# Patient Record
Sex: Female | Born: 1935 | Race: White | Hispanic: No | Smoking: Never smoker
Health system: Southern US, Community
[De-identification: ages and names within clinical notes are randomized; demographics above are authoritative.]

## PROBLEM LIST (undated history)

## (undated) DIAGNOSIS — F0390 Unspecified dementia without behavioral disturbance: Secondary | ICD-10-CM

## (undated) DIAGNOSIS — I10 Essential (primary) hypertension: Secondary | ICD-10-CM

---

## 2017-08-27 ENCOUNTER — Other Ambulatory Visit: Payer: Self-pay

## 2017-08-27 ENCOUNTER — Emergency Department: Payer: Medicare Other

## 2017-08-27 ENCOUNTER — Emergency Department
Admission: EM | Admit: 2017-08-27 | Discharge: 2017-08-28 | Disposition: A | Discharge status: Home / Self Care | Payer: Medicare Other | Attending: Emergency Medicine | Admitting: Emergency Medicine

## 2017-08-27 DIAGNOSIS — W052XXA Fall from non-moving motorized mobility scooter, initial encounter: Secondary | ICD-10-CM | POA: Insufficient documentation

## 2017-08-27 DIAGNOSIS — Y998 Other external cause status: Secondary | ICD-10-CM | POA: Diagnosis not present

## 2017-08-27 DIAGNOSIS — Y92129 Unspecified place in nursing home as the place of occurrence of the external cause: Secondary | ICD-10-CM | POA: Insufficient documentation

## 2017-08-27 DIAGNOSIS — F0390 Unspecified dementia without behavioral disturbance: Secondary | ICD-10-CM | POA: Diagnosis not present

## 2017-08-27 DIAGNOSIS — Y9389 Activity, other specified: Secondary | ICD-10-CM | POA: Diagnosis not present

## 2017-08-27 DIAGNOSIS — Z79899 Other long term (current) drug therapy: Secondary | ICD-10-CM | POA: Diagnosis not present

## 2017-08-27 DIAGNOSIS — S0990XA Unspecified injury of head, initial encounter: Secondary | ICD-10-CM | POA: Diagnosis present

## 2017-08-27 DIAGNOSIS — S0101XA Laceration without foreign body of scalp, initial encounter: Secondary | ICD-10-CM | POA: Diagnosis not present

## 2017-08-27 NOTE — ED Notes (Signed)
Pt is back from medical imaging. 

## 2017-08-27 NOTE — ED Provider Notes (Addendum)
[facility] Emergency Department Provider Note ____________________________________________   First MD Initiated Contact with Patient 08/27/17 2217     (approximate)  I have reviewed the triage vital signs and the nursing notes.   HISTORY  Chief Complaint Fall  Level 5 caveat: History of present illness limited due to dementia.  HPI Sabrina Burke is a 84 y.o. female with unclear past medical history who presents after a fall.  Per EMS, the patient had a unwitnessed fall from a wheelchair.  She was noted to have a laceration to the left side of her head.  The patient does not remember the fall, and denies any acute complaints.  No past medical history on file.  There are no active problems to display for this patient.    Prior to Admission medications   Medication Sig Start Date End Date Taking? Authorizing Provider  acetaminophen (TYLENOL) 325 MG tablet Take 650 mg by mouth every 6 (six) hours as needed for mild pain or fever.   Yes [provider]  amLODipine (NORVASC) 5 MG tablet Take 5 mg by mouth daily.   Yes [provider]  atenolol (TENORMIN) 25 MG tablet Take 25 mg by mouth daily.   Yes [provider]  cholecalciferol (VITAMIN D) 1000 units tablet Take 1,000 Units by mouth daily.   Yes [provider]  ipratropium-albuterol (DUONEB) 0.5-2.5 (3) MG/3ML SOLN Take 3 mLs by nebulization every 6 (six) hours as needed (shortness of breath).   Yes [provider]  levothyroxine (SYNTHROID, LEVOTHROID) 50 MCG tablet Take 50 mcg by mouth daily before breakfast.   Yes [provider]  LORazepam (ATIVAN) 0.5 MG tablet Take 0.25 mg by mouth 2 (two) times daily. Takes at 1400 and 2100   Yes [provider]  LORazepam (ATIVAN) 0.5 MG tablet Take 0.5 mg by mouth 2 (two) times daily as needed for anxiety (agitation).   Yes [provider]  losartan (COZAAR) 25 MG tablet Take 25 mg by mouth  daily.   Yes [provider]  Multiple Vitamin (DAILY VITE PO) Take 1 tablet by mouth daily.   Yes [provider]  QUEtiapine (SEROQUEL) 50 MG tablet Take 50 mg by mouth 2 (two) times daily. Takes at 1400 and 2100   Yes [provider]    Allergies Patient has no allergy information on record.  No family history on file.  Social History Social History   Tobacco Use  . Smoking status: Not on file  Substance Use Topics  . Alcohol use: Not on file  . Drug use: Not on file    Review of Systems Level 5 caveat: Unable to obtain review of systems due to dementia    ____________________________________________   PHYSICAL EXAM:  VITAL SIGNS: ED Triage Vitals  Enc Vitals Group     BP 08/27/17 2132 (!) 175/80     Pulse Rate 08/27/17 2132 64     Resp 08/27/17 2132 18     Temp 08/27/17 2132 (!) 97.5 F (36.4 C)     Temp src --      SpO2 08/27/17 2131 97 %     Weight 08/27/17 2132 134 lb (60.8 kg)     Height 08/27/17 2132 5\' 6"  (1.676 m)     Head Circumference --      Peak Flow --      Pain Score --      Pain Loc --      Pain Edu? --  Excl. in GC? --     Constitutional: Alert, answering questions appropriately.  Well-appearing for age. Eyes: Conjunctivae are normal.  EOMI.  PERRLA. Head: 2 cm superficial laceration to left parietal area. Nose: No congestion/rhinnorhea. Mouth/Throat: Mucous membranes are moist.   Neck: Normal range of motion.  No midline cervical spinal tenderness. Cardiovascular: Good peripheral circulation.  Chest wall nontender. Respiratory: Normal respiratory effort.  Gastrointestinal: Soft and nontender.  Genitourinary: No flank tenderness. Musculoskeletal: Extremities warm and well perfused.  No deformities.  No midline spinal tenderness. Neurologic:  Normal speech and language.  Motor intact in all extremities.  No gross focal neurologic deficits are appreciated.  Skin:  Skin is warm and dry. No rash  noted. Psychiatric: Calm and cooperative.   ____________________________________________   LABS (all labs ordered are listed, but only abnormal results are displayed)  Labs Reviewed - No data to display ____________________________________________  EKG   ____________________________________________  RADIOLOGY  CT head: Pending CT cervical spine: Pending  ____________________________________________   PROCEDURES  Procedure(s) performed: Yes  .Marland KitchenLaceration Repair Date/Time: 08/27/2017 11:35 PM Performed by: Dionne Bucy, MD Authorized by: Dionne Bucy, MD   Consent:    Consent obtained:  Verbal   Consent given by:  Patient   Risks discussed:  Infection and pain   Alternatives discussed:  No treatment Anesthesia (see MAR for exact dosages):    Anesthesia method:  None Laceration details:    Location:  Scalp   Scalp location:  L parietal   Length (cm):  2   Depth (mm):  1 Repair type:    Repair type:  Simple Exploration:    Wound exploration: entire depth of wound probed and visualized     Contaminated: no   Treatment:    Area cleansed with:  Saline   Amount of cleaning:  Extensive   Irrigation solution:  Sterile saline   Visualized foreign bodies/material removed: no   Skin repair:    Repair method:  Tissue adhesive Approximation:    Approximation:  Close Post-procedure details:    Dressing:  Open (no dressing)   Patient tolerance of procedure:  Tolerated well, no immediate complications    Critical Care performed: No ____________________________________________   INITIAL IMPRESSION / ASSESSMENT AND PLAN / ED COURSE  Pertinent labs & imaging results that were available during my care of the patient were reviewed by me and considered in my medical decision making (see chart for details).  84 year old female with PMH as noted above including history of dementia presents after an apparent mechanical fall from her wheelchair.  She has a  very superficial laceration to the left side of her head which cannot be separated, but no other apparent injuries.  He can be repaired with Dermabond.  She denies any pain or other acute complaints at this time.  On exam, she is comfortable appearing, her vital signs are normal except for hypertension, neuro exam is nonfocal, and there is no other evidence of injury.  At this time, there is no indication for medical work-up.  Will obtain CT head and cervical spine due to her elevated risk of injury related to her age.  I will repair the laceration, and anticipate discharge home if the imaging is negative.  ----------------------------------------- 11:34 PM on 08/27/2017 -----------------------------------------  Laceration repaired successfully with Dermabond.  CT head and C-spine are negative for acute traumatic findings.  The patient is stable for discharge back to her facility.  Return precautions and discharge instructions will be provided in the paperwork.  ____________________________________________   FINAL CLINICAL IMPRESSION(S) / ED DIAGNOSES  Final diagnoses:  Laceration of scalp, initial encounter  Injury of head, initial encounter      NEW MEDICATIONS STARTED DURING THIS VISIT:  New Prescriptions   No medications on file     Note:  This document was prepared using Dragon voice recognition software and may include unintentional dictation errors.       Dionne BucySiadecki, Tulsi Crossett, MD 08/27/17 [phone]

## 2017-08-27 NOTE — ED Notes (Signed)
Esign will not be done. Pt has dementia and is unable to sign.

## 2017-08-27 NOTE — ED Notes (Signed)
Pt is resting in bed. nadn 

## 2017-08-27 NOTE — ED Triage Notes (Addendum)
Pt BIB EMS from [facility] for unwitnessed fall from wheelchair. Small lac noted to left side of head. Bleeding controlled. Hx dementia. ABCs intact. NAD. EMS did not know allergies.

## 2017-08-31 ENCOUNTER — Emergency Department
Admission: EM | Admit: 2017-08-31 | Discharge: 2017-08-31 | Disposition: A | Discharge status: Home / Self Care | Payer: Medicare Other | Attending: Emergency Medicine | Admitting: Emergency Medicine

## 2017-08-31 ENCOUNTER — Emergency Department: Payer: Medicare Other

## 2017-08-31 ENCOUNTER — Other Ambulatory Visit: Payer: Self-pay

## 2017-08-31 DIAGNOSIS — W19XXXA Unspecified fall, initial encounter: Secondary | ICD-10-CM

## 2017-08-31 DIAGNOSIS — S0003XA Contusion of scalp, initial encounter: Secondary | ICD-10-CM | POA: Diagnosis not present

## 2017-08-31 DIAGNOSIS — W0110XA Fall on same level from slipping, tripping and stumbling with subsequent striking against unspecified object, initial encounter: Secondary | ICD-10-CM | POA: Insufficient documentation

## 2017-08-31 DIAGNOSIS — Z79899 Other long term (current) drug therapy: Secondary | ICD-10-CM | POA: Insufficient documentation

## 2017-08-31 DIAGNOSIS — I10 Essential (primary) hypertension: Secondary | ICD-10-CM | POA: Diagnosis not present

## 2017-08-31 DIAGNOSIS — S0990XA Unspecified injury of head, initial encounter: Secondary | ICD-10-CM | POA: Diagnosis present

## 2017-08-31 DIAGNOSIS — Y92129 Unspecified place in nursing home as the place of occurrence of the external cause: Secondary | ICD-10-CM | POA: Insufficient documentation

## 2017-08-31 DIAGNOSIS — Y9389 Activity, other specified: Secondary | ICD-10-CM | POA: Diagnosis not present

## 2017-08-31 DIAGNOSIS — Y998 Other external cause status: Secondary | ICD-10-CM | POA: Insufficient documentation

## 2017-08-31 HISTORY — DX: Essential (primary) hypertension: I10

## 2017-08-31 NOTE — ED Notes (Signed)
EDP aware of high BP. No orders at this time. Will continue to monitor.

## 2017-08-31 NOTE — ED Notes (Signed)
Report given to Kala RN. 

## 2017-08-31 NOTE — Discharge Instructions (Signed)
Please apply neosporin to the head wound 2 times per day.  Have her see primary

## 2017-08-31 NOTE — ED Notes (Signed)
Report given to [facility]. Informing her that pt's scan came back negative and everything checked out.

## 2017-08-31 NOTE — ED Triage Notes (Signed)
Pt comes via [facility] from [facility] with c/o fall. Per EMS pt possibly fell out of chair face forward. Pt has hematoma noted to right side. Pt is alert. VSS. Pt denies any pain.

## 2017-08-31 NOTE — ED Provider Notes (Signed)
Patient was seen and examined by me, right temporoparietal scalp laceration that appears to be superficial.  Brain scan was unremarkable as read by me.  She is hypertensive but seems somewhat agitated likely from being outside of her typical environment, she does have a history of dementia.  She appears medically stable at this time.   Emily FilbertWilliams, Jonathan E, MD 08/31/17 2207

## 2017-08-31 NOTE — ED Provider Notes (Signed)
[facility] Emergency Department Provider Note  ____________________________________________   None    (approximate)  I have reviewed the triage vital signs and the nursing notes.   HISTORY  Chief Complaint Fall   HPI Sanaya Kingsberry is a 84 y.o. female who presents to the emergency department after a fall.  She is a resident at [facility].  The fall was unwitnessed.  She did strike her head and now has a hematoma on the right parietal area with a skin tear.  Patient has dementia at baseline.  She denies pain.  This is her second visit for unwitnessed fall this week.  She was here on August 8 with a laceration to the left side of her head.   Past Medical History:  Diagnosis Date  . Hypertension     There are no active problems to display for this patient.   History reviewed. No pertinent surgical history.  Prior to Admission medications   Medication Sig Start Date End Date Taking? Authorizing Provider  acetaminophen (TYLENOL) 325 MG tablet Take 650 mg by mouth every 6 (six) hours as needed for mild pain or fever.    [provider]  amLODipine (NORVASC) 5 MG tablet Take 5 mg by mouth daily.    [provider]  atenolol (TENORMIN) 25 MG tablet Take 25 mg by mouth daily.    [provider]  cholecalciferol (VITAMIN D) 1000 units tablet Take 1,000 Units by mouth daily.    [provider]  ipratropium-albuterol (DUONEB) 0.5-2.5 (3) MG/3ML SOLN Take 3 mLs by nebulization every 6 (six) hours as needed (shortness of breath).    [provider]  levothyroxine (SYNTHROID, LEVOTHROID) 50 MCG tablet Take 50 mcg by mouth daily before breakfast.    [provider]  LORazepam (ATIVAN) 0.5 MG tablet Take 0.25 mg by mouth 2 (two) times daily. Takes at 1400 and 2100    [provider]  LORazepam (ATIVAN) 0.5 MG tablet Take 0.5 mg by mouth 2 (two) times daily as needed for anxiety (agitation).     [provider]  losartan (COZAAR) 25 MG tablet Take 25 mg by mouth daily.    [provider]  Multiple Vitamin (DAILY VITE PO) Take 1 tablet by mouth daily.    [provider]  QUEtiapine (SEROQUEL) 50 MG tablet Take 50 mg by mouth 2 (two) times daily. Takes at 1400 and 2100    [provider]    Allergies Patient has no allergy information on record.  No family history on file.  Social History Social History   Tobacco Use  . Smoking status: Not on file  Substance Use Topics  . Alcohol use: Not on file  . Drug use: Not on file    Review of Systems  Level 5 caveat: Unable to obtain review of systems due to dementia. ____________________________________________   PHYSICAL EXAM:  VITAL SIGNS: ED Triage Vitals  Enc Vitals Group     BP 08/31/17 2111 (!) 216/86     Pulse Rate 08/31/17 2106 75     Resp 08/31/17 2106 18     Temp 08/31/17 2106 97.9 F (36.6 C)     Temp Source 08/31/17 2106 Oral     SpO2 08/31/17 2106 97 %     Weight 08/31/17 2104 134 lb (60.8 kg)     Height 08/31/17 2104 5\' 6"  (1.676 m)     Head Circumference --      Peak Flow --  Pain Score 08/31/17 2104 0     Pain Loc --      Pain Edu? --      Excl. in GC? --     Constitutional: Alert and oriented only to person.  Chronically ill appearing and in no acute distress. Eyes: Conjunctivae are normal. PERRL Head: Hematoma to the right parietal aspect with skin tear. Nose: No epistaxis Mouth/Throat: Mucous membranes are moist.  No malocclusion Neck: No stridor.  No indication of pain on palpation over the cervical spine. Cardiovascular: Normal rate, regular rhythm. Grossly normal heart sounds.  Good peripheral circulation. Respiratory: Normal respiratory effort.  No retractions. Lungs CTAB. Gastrointestinal: Soft and nontender. No distention. No abdominal bruits. No CVA tenderness. Musculoskeletal: Active range of motion of all extremities without focal joint  tenderness.   Neurologic:  Normal speech and language.  Dementia at baseline. Skin:  Skin is warm, dry and intact.  Hematoma as described above Psychiatric: Mood and affect are normal.   ____________________________________________   LABS (all labs ordered are listed, but only abnormal results are displayed)  Labs Reviewed - No data to display ____________________________________________  EKG  Not indicated. ____________________________________________  RADIOLOGY  ED MD interpretation:  No acute abnormality on CT head or CT cervical spine.  Official radiology report(s): Ct Head Wo Contrast  Result Date: 08/31/2017 CLINICAL DATA:  Unwitnessed fall. Hematoma to the right parietal area. Patient does not recall event. EXAM: CT HEAD WITHOUT CONTRAST CT CERVICAL SPINE WITHOUT CONTRAST TECHNIQUE: Multidetector CT imaging of the head and cervical spine was performed following the standard protocol without intravenous contrast. Multiplanar CT image reconstructions of the cervical spine were also generated. COMPARISON:  08/27/2017 FINDINGS: CT HEAD FINDINGS Brain: Diffuse cerebral atrophy. Ventricular dilatation consistent with central atrophy. Low-attenuation changes in the deep white matter consistent small vessel ischemia. No mass-effect or midline shift. No abnormal extra-axial fluid collections. Gray-white matter junctions are distinct. Basal cisterns are not effaced. No evidence of acute intracranial hemorrhage. Vascular: Intracranial arterial vascular calcifications are present. Skull: Calvarium appears intact. Sinuses/Orbits: Mucosal thickening in the paranasal sinuses. No acute air-fluid levels. Mastoid air cells are clear. Other: Small subcutaneous scalp hematoma over the right posterior frontal region. CT CERVICAL SPINE FINDINGS Alignment: There is mild reversal of the usual cervical lordosis, likely degenerative. Ligamentous injury or muscle spasm can also have this appearance and are not  entirely excluded. C1-2 articulation appears intact. Normal alignment of the facet joints. Skull base and vertebrae: Diffuse bone demineralization. Skull base appears intact. No vertebral compression deformities. No focal bone lesion or bone destruction. Soft tissues and spinal canal: No prevertebral soft tissue swelling. No paraspinal soft tissue mass or infiltration. Disc levels: Degenerative changes throughout the cervical spine with narrowed interspaces and endplate hypertrophic changes. Degenerative changes in the posterior facet joints and in the uncovertebral joints. Bone spurs cause encroachment upon the neural foramina at multiple levels bilaterally. Upper chest: Probable emphysematous changes in the upper lungs. Evaluation is limited by motion artifact. Vascular calcifications. Other: None. IMPRESSION: 1. No acute intracranial abnormalities. Chronic atrophy and small vessel ischemic changes. 2. Nonspecific reversal of the usual cervical lordosis. Degenerative changes in the cervical spine. No acute displaced fractures identified. Electronically Signed   By: Burman NievesWilliam  Stevens M.D.   On: 08/31/2017 22:10   Ct Cervical Spine Wo Contrast  Result Date: 08/31/2017 CLINICAL DATA:  Unwitnessed fall. Hematoma to the right parietal area. Patient does not recall event. EXAM: CT HEAD WITHOUT CONTRAST CT CERVICAL SPINE WITHOUT CONTRAST TECHNIQUE: Multidetector  CT imaging of the head and cervical spine was performed following the standard protocol without intravenous contrast. Multiplanar CT image reconstructions of the cervical spine were also generated. COMPARISON:  08/27/2017 FINDINGS: CT HEAD FINDINGS Brain: Diffuse cerebral atrophy. Ventricular dilatation consistent with central atrophy. Low-attenuation changes in the deep white matter consistent small vessel ischemia. No mass-effect or midline shift. No abnormal extra-axial fluid collections. Gray-white matter junctions are distinct. Basal cisterns are not  effaced. No evidence of acute intracranial hemorrhage. Vascular: Intracranial arterial vascular calcifications are present. Skull: Calvarium appears intact. Sinuses/Orbits: Mucosal thickening in the paranasal sinuses. No acute air-fluid levels. Mastoid air cells are clear. Other: Small subcutaneous scalp hematoma over the right posterior frontal region. CT CERVICAL SPINE FINDINGS Alignment: There is mild reversal of the usual cervical lordosis, likely degenerative. Ligamentous injury or muscle spasm can also have this appearance and are not entirely excluded. C1-2 articulation appears intact. Normal alignment of the facet joints. Skull base and vertebrae: Diffuse bone demineralization. Skull base appears intact. No vertebral compression deformities. No focal bone lesion or bone destruction. Soft tissues and spinal canal: No prevertebral soft tissue swelling. No paraspinal soft tissue mass or infiltration. Disc levels: Degenerative changes throughout the cervical spine with narrowed interspaces and endplate hypertrophic changes. Degenerative changes in the posterior facet joints and in the uncovertebral joints. Bone spurs cause encroachment upon the neural foramina at multiple levels bilaterally. Upper chest: Probable emphysematous changes in the upper lungs. Evaluation is limited by motion artifact. Vascular calcifications. Other: None. IMPRESSION: 1. No acute intracranial abnormalities. Chronic atrophy and small vessel ischemic changes. 2. Nonspecific reversal of the usual cervical lordosis. Degenerative changes in the cervical spine. No acute displaced fractures identified. Electronically Signed   By: Burman NievesWilliam  Stevens M.D.   On: 08/31/2017 22:10    ____________________________________________   PROCEDURES  Procedure(s) performed: None  Procedures  Critical Care performed: No  ____________________________________________   INITIAL IMPRESSION / ASSESSMENT AND PLAN / ED COURSE  As part of my  medical decision making, I reviewed the following data within the electronic MEDICAL RECORD NUMBER Notes from prior ED visits   84 year old female presenting to the emergency department after a fall.  CT and exam are reassuring.  She will be discharged back to the care facility.  Wound care instructions have been provided.      ____________________________________________   FINAL CLINICAL IMPRESSION(S) / ED DIAGNOSES  Final diagnoses:  Scalp hematoma, initial encounter  Fall, initial encounter     ED Discharge Orders    None       Note:  This document was prepared using Dragon voice recognition software and may include unintentional dictation errors.    Chinita Pesterriplett, Gaynel Schaafsma B, FNP 08/31/17 2338    Emily FilbertWilliams, Jonathan E, MD 09/02/17 [phone]

## 2017-08-31 NOTE — ED Notes (Signed)
Pt is a memory care pt with Dementia and cannot sign for herself.

## 2018-03-30 ENCOUNTER — Emergency Department: Payer: Medicare Other

## 2018-03-30 ENCOUNTER — Emergency Department
Admission: EM | Admit: 2018-03-30 | Discharge: 2018-03-30 | Disposition: A | Discharge status: Skilled Nursing Facility | Payer: Medicare Other | Attending: Emergency Medicine | Admitting: Emergency Medicine

## 2018-03-30 DIAGNOSIS — Y999 Unspecified external cause status: Secondary | ICD-10-CM | POA: Diagnosis not present

## 2018-03-30 DIAGNOSIS — W050XXA Fall from non-moving wheelchair, initial encounter: Secondary | ICD-10-CM | POA: Insufficient documentation

## 2018-03-30 DIAGNOSIS — Y92129 Unspecified place in nursing home as the place of occurrence of the external cause: Secondary | ICD-10-CM | POA: Insufficient documentation

## 2018-03-30 DIAGNOSIS — S0083XA Contusion of other part of head, initial encounter: Secondary | ICD-10-CM | POA: Insufficient documentation

## 2018-03-30 DIAGNOSIS — S0990XA Unspecified injury of head, initial encounter: Secondary | ICD-10-CM | POA: Diagnosis present

## 2018-03-30 DIAGNOSIS — Y9389 Activity, other specified: Secondary | ICD-10-CM | POA: Diagnosis not present

## 2018-03-30 DIAGNOSIS — I10 Essential (primary) hypertension: Secondary | ICD-10-CM | POA: Insufficient documentation

## 2018-03-30 DIAGNOSIS — Z79899 Other long term (current) drug therapy: Secondary | ICD-10-CM | POA: Insufficient documentation

## 2018-03-30 DIAGNOSIS — W19XXXA Unspecified fall, initial encounter: Secondary | ICD-10-CM

## 2018-03-30 MED ORDER — ACETAMINOPHEN 500 MG PO TABS: 1000.0000 mg | ORAL_TABLET | Freq: Once | ORAL | Status: DC

## 2018-03-30 NOTE — ED Notes (Signed)
This RN called [facility] to provide report/update on patient. Care handoff report given to [facility].

## 2018-03-30 NOTE — ED Provider Notes (Signed)
[facility] Emergency Department Provider Note  ____________________________________________  Time seen: Approximately 9:20 AM  I have reviewed the triage vital signs and the nursing notes.   HISTORY  Chief Complaint Fall  Level 5 caveat:  Portions of the history and physical were unable to be obtained due to dementia   HPI Sabrina Burke is a 85 y.o. female with a history of hypertension and dementia who presents for evaluation of fall and head trauma.  Fall was witnessed.  Patient was wearing slippery socks and trying to get up from her wheelchair when she slipped and fell.  She hit her head on the floor.  She is not on blood thinners, no LOC.  Patient denies headache, neck pain, back pain, dizziness, extremity pain.  Past Medical History:  Diagnosis Date  . Hypertension     Prior to Admission medications   Medication Sig Start Date End Date Taking? Authorizing Provider  acetaminophen (TYLENOL) 325 MG tablet Take 650 mg by mouth every 6 (six) hours as needed for mild pain or fever.    [provider]  amLODipine (NORVASC) 5 MG tablet Take 5 mg by mouth daily.    [provider]  atenolol (TENORMIN) 25 MG tablet Take 25 mg by mouth daily.    [provider]  cholecalciferol (VITAMIN D) 1000 units tablet Take 1,000 Units by mouth daily.    [provider]  ipratropium-albuterol (DUONEB) 0.5-2.5 (3) MG/3ML SOLN Take 3 mLs by nebulization every 6 (six) hours as needed (shortness of breath).    [provider]  levothyroxine (SYNTHROID, LEVOTHROID) 50 MCG tablet Take 50 mcg by mouth daily before breakfast.    [provider]  LORazepam (ATIVAN) 0.5 MG tablet Take 0.25 mg by mouth 2 (two) times daily. Takes at 1400 and 2100    [provider]  LORazepam (ATIVAN) 0.5 MG tablet Take 0.5 mg by mouth 2 (two) times daily as needed for anxiety (agitation).    [provider]  losartan (COZAAR)  25 MG tablet Take 25 mg by mouth daily.    [provider]  Multiple Vitamin (DAILY VITE PO) Take 1 tablet by mouth daily.    [provider]  QUEtiapine (SEROQUEL) 50 MG tablet Take 50 mg by mouth 2 (two) times daily. Takes at 1400 and 2100    [provider]    Allergies Patient has no allergy information on record.  No family history on file.  Social History Smoking - former Alcohol - no Drugs - no  Review of Systems Constitutional: Negative for fever. Eyes: Negative for visual changes. ENT: Negative for facial injury or neck injury Cardiovascular: Negative for chest injury. Respiratory: Negative for shortness of breath. Negative for chest wall injury. Gastrointestinal: Negative for abdominal pain or injury. Genitourinary: Negative for dysuria. Musculoskeletal: Negative for back injury, negative for arm or leg pain. Skin: Negative for laceration/abrasions. Neurological: + head injury.   ____________________________________________   PHYSICAL EXAM:  VITAL SIGNS: ED Triage Vitals  Enc Vitals Group     BP 03/30/18 0913 (!) 178/60     Pulse --      Resp 03/30/18 0913 18     Temp 03/30/18 0913 97.9 F (36.6 C)     Temp Source 03/30/18 0913 Oral     SpO2 03/30/18 0913 98 %     Weight --      Height --      Head Circumference --      Peak  Flow --      Pain Score 03/30/18 0915 0     Pain Loc --      Pain Edu? --      Excl. in GC? --    Full spinal precautions maintained throughout the trauma exam. Constitutional: Alert and oriented x1. No acute distress. Does not appear intoxicated. HEENT Head: Normocephalic, forehead hematoma on the L. Face: No facial bony tenderness. Stable midface Ears: No hemotympanum bilaterally. No Battle sign Eyes: No eye injury. PERRL. No raccoon eyes Nose: Nontender. No epistaxis. No rhinorrhea Mouth/Throat: Mucous membranes are moist. No oropharyngeal blood. No dental injury. Airway patent without stridor.  Normal voice. Neck: no C-collar in place. No midline c-spine tenderness.  Cardiovascular: Normal rate, regular rhythm. Normal and symmetric distal pulses are present in all extremities. Pulmonary/Chest: Chest wall is stable and nontender to palpation/compression. Normal respiratory effort. Breath sounds are normal. No crepitus.  Abdominal: Soft, nontender, non distended. Musculoskeletal: Nontender with normal full range of motion in all extremities. No deformities. No thoracic or lumbar midline spinal tenderness. Pelvis is stable. Skin: Skin is warm, dry and intact. No abrasions or contutions. Psychiatric: Speech and behavior are appropriate. Neurological: Normal speech and language. Moves all extremities to command. No gross focal neurologic deficits are appreciated.  Glascow Coma Score: 4 - Opens eyes on own 6 - Follows simple motor commands 5 - Alert and oriented GCS: 15   ____________________________________________   LABS (all labs ordered are listed, but only abnormal results are displayed)  Labs Reviewed - No data to display ____________________________________________  EKG  none  ____________________________________________  RADIOLOGY  I have personally reviewed the images performed during this visit and I agree with the Radiologist's read.   Interpretation by Radiologist:  Ct Head Wo Contrast  Result Date: 03/30/2018 CLINICAL DATA:  85 year old female with a history of unwitnessed fall from wheelchair EXAM: CT HEAD WITHOUT CONTRAST CT CERVICAL SPINE WITHOUT CONTRAST TECHNIQUE: Multidetector CT imaging of the head and cervical spine was performed following the standard protocol without intravenous contrast. Multiplanar CT image reconstructions of the cervical spine were also generated. COMPARISON:  08/31/2017, 08/27/2017 FINDINGS: CT HEAD FINDINGS Brain: No acute intracranial hemorrhage. No midline shift or mass effect. Gray-white differentiation is maintained. Similar  degree of mild volume loss. Confluent hypodensity compatible with chronic microvascular ischemia. Vascular: Calcifications of the anterior circulation. Skull: No displaced fracture. Hematoma in the left frontal soft tissues. Sinuses/Orbits: No significant paranasal sinus disease. Unremarkable visualized orbits. Other: None CT CERVICAL SPINE FINDINGS Alignment: Reversal the normal cervical lordosis, similar to prior. No subluxation. Similar degree trace anterolisthesis of C3 on C4 measuring 2 mm-3 mm. Skull base and vertebrae: No skull base fracture. No vertebral body fracture identified. Vertebral body heights relatively maintained. Soft tissues and spinal canal: Relatively unremarkable soft tissues of the cervical region. Disc levels: Disc space narrowing throughout the cervical spine. Disc changes most pronounced at C5-C6 with anterior osteophyte production and bilateral uncovertebral joint disease. Mild-to-moderate facet disease bilaterally. No significant bony canal narrowing. Upper chest: No acute finding of the upper chest Other: None IMPRESSION: Head CT: No acute intracranial abnormality. Hematoma of the left frontal soft tissues with no underlying skull fracture. Evidence of chronic white matter disease. Cervical CT: No acute fracture or malalignment of the cervical spine. Multilevel degenerative disc disease and facet disease. Electronically Signed   By: Gilmer Mor D.O.   On: 03/30/2018 10:33   Ct Cervical Spine Wo Contrast  Result Date: 03/30/2018 CLINICAL DATA:  85 year old  female with a history of unwitnessed fall from wheelchair EXAM: CT HEAD WITHOUT CONTRAST CT CERVICAL SPINE WITHOUT CONTRAST TECHNIQUE: Multidetector CT imaging of the head and cervical spine was performed following the standard protocol without intravenous contrast. Multiplanar CT image reconstructions of the cervical spine were also generated. COMPARISON:  08/31/2017, 08/27/2017 FINDINGS: CT HEAD FINDINGS Brain: No acute  intracranial hemorrhage. No midline shift or mass effect. Gray-white differentiation is maintained. Similar degree of mild volume loss. Confluent hypodensity compatible with chronic microvascular ischemia. Vascular: Calcifications of the anterior circulation. Skull: No displaced fracture. Hematoma in the left frontal soft tissues. Sinuses/Orbits: No significant paranasal sinus disease. Unremarkable visualized orbits. Other: None CT CERVICAL SPINE FINDINGS Alignment: Reversal the normal cervical lordosis, similar to prior. No subluxation. Similar degree trace anterolisthesis of C3 on C4 measuring 2 mm-3 mm. Skull base and vertebrae: No skull base fracture. No vertebral body fracture identified. Vertebral body heights relatively maintained. Soft tissues and spinal canal: Relatively unremarkable soft tissues of the cervical region. Disc levels: Disc space narrowing throughout the cervical spine. Disc changes most pronounced at C5-C6 with anterior osteophyte production and bilateral uncovertebral joint disease. Mild-to-moderate facet disease bilaterally. No significant bony canal narrowing. Upper chest: No acute finding of the upper chest Other: None IMPRESSION: Head CT: No acute intracranial abnormality. Hematoma of the left frontal soft tissues with no underlying skull fracture. Evidence of chronic white matter disease. Cervical CT: No acute fracture or malalignment of the cervical spine. Multilevel degenerative disc disease and facet disease. Electronically Signed   By: Gilmer Mor D.O.   On: 03/30/2018 10:33      ____________________________________________   PROCEDURES  Procedure(s) performed: None Procedures Critical Care performed:  None ____________________________________________   INITIAL IMPRESSION / ASSESSMENT AND PLAN / ED COURSE  85 y.o. female with a history of hypertension and dementia who presents for evaluation of fall and head trauma.  Patient is currently at baseline per EMS.  She  is alert and oriented to self, able to answer questions and follow commands, normal speech.  Patient is pleasant and cooperative.  She denies any pain.  She has an obvious left-sided forehead hematoma with no other traumatic findings seen on exam.  Will give Tylenol and send patient for CT head and cervical spine.    _________________________ 10:59 AM on 03/30/2018 -----------------------------------------  CT with no evidence of intracranial or cervical injury.  Patient remains at baseline.  Will discharge patient back to the nursing home.   As part of my medical decision making, I reviewed the following data within the electronic MEDICAL RECORD NUMBER Nursing notes reviewed and incorporated, Old chart reviewed, Radiograph reviewed , Notes from prior ED visits and [place] Controlled Substance Database    Pertinent labs & imaging results that were available during my care of the patient were reviewed by me and considered in my medical decision making (see chart for details).    ____________________________________________   FINAL CLINICAL IMPRESSION(S) / ED DIAGNOSES  Final diagnoses:  Fall, initial encounter  Traumatic hematoma of forehead, initial encounter      NEW MEDICATIONS STARTED DURING THIS VISIT:  ED Discharge Orders    None       Note:  This document was prepared using Dragon voice recognition software and may include unintentional dictation errors.    Don Perking, [place], MD 03/30/18 1059

## 2018-03-30 NOTE — ED Notes (Addendum)
This RN provide status update to [facility] visitor

## 2018-03-30 NOTE — ED Notes (Signed)
Pt altered and unable to contact family to sing DC instructions/documents

## 2018-03-30 NOTE — ED Notes (Addendum)
This RN attempted to call daughter, Josiephine Mirkin.Daugther , unable to speak to daughter/leave a voicemail

## 2018-03-30 NOTE — Discharge Instructions (Signed)

## 2018-03-30 NOTE — ED Notes (Signed)
Patient transported to CT 

## 2018-03-30 NOTE — ED Notes (Signed)
Called [facility] for transport to [facility] [phone]

## 2018-03-30 NOTE — ED Triage Notes (Addendum)
Pt arrived to ED via [facility] from [facility] facility Per EMS pt fell off from wheelchair. No LOC, pt denies HA, pain.  Hematoma noted on forehead/left side. VSS. NAD noted at this time.

## 2018-06-26 ENCOUNTER — Emergency Department: Payer: Medicare Other

## 2018-06-26 ENCOUNTER — Other Ambulatory Visit: Payer: Self-pay

## 2018-06-26 ENCOUNTER — Encounter: Payer: Self-pay | Admitting: Emergency Medicine

## 2018-06-26 ENCOUNTER — Inpatient Hospital Stay
Admission: EM | Admit: 2018-06-26 | Discharge: 2018-06-28 | DRG: 872 | Disposition: A | Discharge status: Skilled Nursing Facility | Payer: Medicare Other | Attending: Internal Medicine | Admitting: Internal Medicine

## 2018-06-26 DIAGNOSIS — F0150 Vascular dementia without behavioral disturbance: Secondary | ICD-10-CM | POA: Diagnosis present

## 2018-06-26 DIAGNOSIS — Z79890 Hormone replacement therapy (postmenopausal): Secondary | ICD-10-CM

## 2018-06-26 DIAGNOSIS — N3000 Acute cystitis without hematuria: Secondary | ICD-10-CM | POA: Diagnosis present

## 2018-06-26 DIAGNOSIS — A419 Sepsis, unspecified organism: Secondary | ICD-10-CM | POA: Diagnosis not present

## 2018-06-26 DIAGNOSIS — I739 Peripheral vascular disease, unspecified: Secondary | ICD-10-CM | POA: Diagnosis present

## 2018-06-26 DIAGNOSIS — B961 Klebsiella pneumoniae [K. pneumoniae] as the cause of diseases classified elsewhere: Secondary | ICD-10-CM | POA: Diagnosis present

## 2018-06-26 DIAGNOSIS — Z79899 Other long term (current) drug therapy: Secondary | ICD-10-CM

## 2018-06-26 DIAGNOSIS — N179 Acute kidney failure, unspecified: Secondary | ICD-10-CM | POA: Diagnosis present

## 2018-06-26 DIAGNOSIS — E039 Hypothyroidism, unspecified: Secondary | ICD-10-CM | POA: Diagnosis present

## 2018-06-26 DIAGNOSIS — Z20828 Contact with and (suspected) exposure to other viral communicable diseases: Secondary | ICD-10-CM | POA: Diagnosis present

## 2018-06-26 DIAGNOSIS — E559 Vitamin D deficiency, unspecified: Secondary | ICD-10-CM | POA: Diagnosis present

## 2018-06-26 DIAGNOSIS — F419 Anxiety disorder, unspecified: Secondary | ICD-10-CM | POA: Diagnosis present

## 2018-06-26 DIAGNOSIS — Z993 Dependence on wheelchair: Secondary | ICD-10-CM

## 2018-06-26 DIAGNOSIS — Z66 Do not resuscitate: Secondary | ICD-10-CM | POA: Diagnosis present

## 2018-06-26 DIAGNOSIS — I10 Essential (primary) hypertension: Secondary | ICD-10-CM | POA: Diagnosis present

## 2018-06-26 DIAGNOSIS — I480 Paroxysmal atrial fibrillation: Secondary | ICD-10-CM | POA: Diagnosis present

## 2018-06-26 LAB — COMPREHENSIVE METABOLIC PANEL
ALT: 15 U/L (ref 0–44)
AST: 18 U/L (ref 15–41)
Albumin: 2.5 g/dL — ABNORMAL LOW (ref 3.5–5.0)
Alkaline Phosphatase: 55 U/L (ref 38–126)
Anion gap: 7 (ref 5–15)
BUN: 40 mg/dL — ABNORMAL HIGH (ref 8–23)
CO2: 24 mmol/L (ref 22–32)
Calcium: 8.3 mg/dL — ABNORMAL LOW (ref 8.9–10.3)
Chloride: 106 mmol/L (ref 98–111)
Creatinine, Ser: 1.42 mg/dL — ABNORMAL HIGH (ref 0.44–1.00)
GFR calc Af Amer: 38 mL/min — ABNORMAL LOW (ref >60)
GFR calc non Af Amer: 33 mL/min — ABNORMAL LOW (ref >60)
Glucose, Bld: 140 mg/dL — ABNORMAL HIGH (ref 70–99)
Potassium: 4.1 mmol/L (ref 3.5–5.1)
Sodium: 137 mmol/L (ref 135–145)
Total Bilirubin: 0.5 mg/dL (ref 0.3–1.2)
Total Protein: 6.4 g/dL — ABNORMAL LOW (ref 6.5–8.1)

## 2018-06-26 LAB — CBC WITH DIFFERENTIAL/PLATELET
Abs Immature Granulocytes: 0.05 10*3/uL (ref 0.00–0.07)
Basophils Absolute: 0 10*3/uL (ref 0.0–0.1)
Basophils Relative: 0 %
Eosinophils Absolute: 0.1 10*3/uL (ref 0.0–0.5)
Eosinophils Relative: 1 %
HCT: 28.5 % — ABNORMAL LOW (ref 36.0–46.0)
Hemoglobin: 9.4 g/dL — ABNORMAL LOW (ref 12.0–15.0)
Immature Granulocytes: 0 %
Lymphocytes Relative: 15 %
Lymphs Abs: 1.7 10*3/uL (ref 0.7–4.0)
MCH: 32.9 pg (ref 26.0–34.0)
MCHC: 33 g/dL (ref 30.0–36.0)
MCV: 99.7 fL (ref 80.0–100.0)
Monocytes Absolute: 1 10*3/uL (ref 0.1–1.0)
Monocytes Relative: 9 %
Neutro Abs: 8.7 10*3/uL — ABNORMAL HIGH (ref 1.7–7.7)
Neutrophils Relative %: 75 %
Platelets: 240 10*3/uL (ref 150–400)
RBC: 2.86 MIL/uL — ABNORMAL LOW (ref 3.87–5.11)
RDW: 11.6 % (ref 11.5–15.5)
WBC: 11.5 10*3/uL — ABNORMAL HIGH (ref 4.0–10.5)
nRBC: 0 % (ref 0.0–0.2)

## 2018-06-26 LAB — BASIC METABOLIC PANEL
Anion gap: 7 (ref 5–15)
BUN: 37 mg/dL — ABNORMAL HIGH (ref 8–23)
CO2: 27 mmol/L (ref 22–32)
Calcium: 8.6 mg/dL — ABNORMAL LOW (ref 8.9–10.3)
Chloride: 107 mmol/L (ref 98–111)
Creatinine, Ser: 1.49 mg/dL — ABNORMAL HIGH (ref 0.44–1.00)
GFR calc Af Amer: 36 mL/min — ABNORMAL LOW (ref >60)
GFR calc non Af Amer: 31 mL/min — ABNORMAL LOW (ref >60)
Glucose, Bld: 129 mg/dL — ABNORMAL HIGH (ref 70–99)
Potassium: 4.9 mmol/L (ref 3.5–5.1)
Sodium: 141 mmol/L (ref 135–145)

## 2018-06-26 LAB — CBC
HCT: 31.2 % — ABNORMAL LOW (ref 36.0–46.0)
Hemoglobin: 10 g/dL — ABNORMAL LOW (ref 12.0–15.0)
MCH: 32.2 pg (ref 26.0–34.0)
MCHC: 32.1 g/dL (ref 30.0–36.0)
MCV: 100.3 fL — ABNORMAL HIGH (ref 80.0–100.0)
Platelets: 241 10*3/uL (ref 150–400)
RBC: 3.11 MIL/uL — ABNORMAL LOW (ref 3.87–5.11)
RDW: 11.6 % (ref 11.5–15.5)
WBC: 10.5 10*3/uL (ref 4.0–10.5)
nRBC: 0 % (ref 0.0–0.2)

## 2018-06-26 LAB — URINALYSIS, ROUTINE W REFLEX MICROSCOPIC
Bilirubin Urine: NEGATIVE
Glucose, UA: NEGATIVE mg/dL
Ketones, ur: NEGATIVE mg/dL
Nitrite: POSITIVE — AB
Protein, ur: 100 mg/dL — AB
Specific Gravity, Urine: 1.011 (ref 1.005–1.030)
WBC, UA: >50 WBC/hpf — ABNORMAL HIGH (ref 0–5)
pH: 5 (ref 5.0–8.0)

## 2018-06-26 LAB — PROTIME-INR
INR: 1 (ref 0.8–1.2)
Prothrombin Time: 13.3 seconds (ref 11.4–15.2)

## 2018-06-26 LAB — BLOOD GAS, VENOUS
Acid-Base Excess: 3.2 mmol/L — ABNORMAL HIGH (ref 0.0–2.0)
Bicarbonate: 27.9 mmol/L (ref 20.0–28.0)
O2 Saturation: 90.5 %
Patient temperature: 37
pCO2, Ven: 42 mmHg — ABNORMAL LOW (ref 44.0–60.0)
pH, Ven: 7.43 (ref 7.250–7.430)
pO2, Ven: 58 mmHg — ABNORMAL HIGH (ref 32.0–45.0)

## 2018-06-26 LAB — TSH: TSH: 4.181 u[IU]/mL (ref 0.350–4.500)

## 2018-06-26 LAB — MRSA PCR SCREENING: MRSA by PCR: NEGATIVE

## 2018-06-26 LAB — PROCALCITONIN: Procalcitonin: 0.24 ng/mL

## 2018-06-26 LAB — CORTISOL-AM, BLOOD: Cortisol - AM: 23.2 ug/dL — ABNORMAL HIGH (ref 6.7–22.6)

## 2018-06-26 LAB — SARS CORONAVIRUS 2 BY RT PCR (HOSPITAL ORDER, PERFORMED IN ~~LOC~~ HOSPITAL LAB): SARS Coronavirus 2: NEGATIVE

## 2018-06-26 LAB — LACTIC ACID, PLASMA
Lactic Acid, Venous: 0.7 mmol/L (ref 0.5–1.9)
Lactic Acid, Venous: 1 mmol/L (ref 0.5–1.9)

## 2018-06-26 MED ORDER — ONDANSETRON HCL 4 MG/2ML IJ SOLN: 4.0000 mg | Freq: Four times a day (QID) | INTRAMUSCULAR | Status: DC | PRN

## 2018-06-26 MED ORDER — VANCOMYCIN HCL IN DEXTROSE 1-5 GM/200ML-% IV SOLN
1000.0000 mg | Freq: Once | INTRAVENOUS | Status: AC
  Administered 2018-06-26: 1000 mg via INTRAVENOUS
  Filled 2018-06-26: qty 200

## 2018-06-26 MED ORDER — VITAMIN D3 25 MCG (1000 UNIT) PO TABS
2000.0000 [IU] | ORAL_TABLET | Freq: Every day | ORAL | Status: DC
  Administered 2018-06-26 – 2018-06-28 (×3): 2000 [IU] via ORAL
  Filled 2018-06-26 (×6): qty 2

## 2018-06-26 MED ORDER — SODIUM CHLORIDE 0.9% FLUSH
3.0000 mL | Freq: Two times a day (BID) | INTRAVENOUS | Status: DC
  Administered 2018-06-26 – 2018-06-28 (×5): 3 mL via INTRAVENOUS

## 2018-06-26 MED ORDER — ALUM & MAG HYDROXIDE-SIMETH 200-200-20 MG/5ML PO SUSP: 30.0000 mL | ORAL | Status: DC | PRN

## 2018-06-26 MED ORDER — METRONIDAZOLE IN NACL 5-0.79 MG/ML-% IV SOLN
500.0000 mg | Freq: Once | INTRAVENOUS | Status: AC
  Administered 2018-06-26: 500 mg via INTRAVENOUS
  Filled 2018-06-26: qty 100

## 2018-06-26 MED ORDER — ENOXAPARIN SODIUM 30 MG/0.3ML ~~LOC~~ SOLN
30.0000 mg | SUBCUTANEOUS | Status: DC
  Administered 2018-06-26 – 2018-06-27 (×2): 30 mg via SUBCUTANEOUS
  Filled 2018-06-26 (×2): qty 0.3

## 2018-06-26 MED ORDER — SODIUM CHLORIDE 0.9 % IV SOLN
2.0000 g | Freq: Once | INTRAVENOUS | Status: AC
  Administered 2018-06-26: 2 g via INTRAVENOUS
  Filled 2018-06-26: qty 2

## 2018-06-26 MED ORDER — POLYETHYLENE GLYCOL 3350 17 G PO PACK: 17.0000 g | PACK | Freq: Every day | ORAL | Status: DC | PRN

## 2018-06-26 MED ORDER — ACETAMINOPHEN 325 MG PO TABS: 650.0000 mg | ORAL_TABLET | Freq: Four times a day (QID) | ORAL | Status: DC | PRN

## 2018-06-26 MED ORDER — SODIUM CHLORIDE 0.9 % IV SOLN
INTRAVENOUS | Status: DC
  Administered 2018-06-26: 06:00:00 via INTRAVENOUS

## 2018-06-26 MED ORDER — AMLODIPINE BESYLATE 5 MG PO TABS
5.0000 mg | ORAL_TABLET | Freq: Every day | ORAL | Status: DC
  Administered 2018-06-26 – 2018-06-27 (×2): 5 mg via ORAL
  Filled 2018-06-26 (×2): qty 1

## 2018-06-26 MED ORDER — LORAZEPAM 0.5 MG PO TABS
0.2500 mg | ORAL_TABLET | Freq: Two times a day (BID) | ORAL | Status: DC
  Administered 2018-06-26 – 2018-06-28 (×5): 0.25 mg via ORAL
  Filled 2018-06-26 (×5): qty 1

## 2018-06-26 MED ORDER — ADULT MULTIVITAMIN W/MINERALS CH
1.0000 | ORAL_TABLET | Freq: Every day | ORAL | Status: DC
  Administered 2018-06-26 – 2018-06-28 (×3): 1 via ORAL
  Filled 2018-06-26 (×3): qty 1

## 2018-06-26 MED ORDER — LEVOTHYROXINE SODIUM 88 MCG PO TABS
88.0000 ug | ORAL_TABLET | Freq: Every day | ORAL | Status: DC
  Administered 2018-06-27 – 2018-06-28 (×2): 88 ug via ORAL
  Filled 2018-06-26 (×3): qty 1

## 2018-06-26 MED ORDER — FOLIC ACID 5 MG/ML IJ SOLN
1.0000 mg | Freq: Every day | INTRAMUSCULAR | Status: DC
  Administered 2018-06-26: 11:00:00 1 mg via INTRAVENOUS
  Filled 2018-06-26 (×2): qty 0.2

## 2018-06-26 MED ORDER — ATENOLOL 25 MG PO TABS
25.0000 mg | ORAL_TABLET | Freq: Every day | ORAL | Status: DC
  Administered 2018-06-26 – 2018-06-27 (×2): 25 mg via ORAL
  Filled 2018-06-26 (×3): qty 1

## 2018-06-26 MED ORDER — IPRATROPIUM-ALBUTEROL 0.5-2.5 (3) MG/3ML IN SOLN
3.0000 mL | Freq: Four times a day (QID) | RESPIRATORY_TRACT | Status: DC
  Administered 2018-06-26: 15:00:00 3 mL via RESPIRATORY_TRACT
  Filled 2018-06-26: qty 3

## 2018-06-26 MED ORDER — SODIUM CHLORIDE 0.9 % IV SOLN
1.0000 g | Freq: Two times a day (BID) | INTRAVENOUS | Status: DC
  Administered 2018-06-26 – 2018-06-27 (×2): 1 g via INTRAVENOUS
  Filled 2018-06-26 (×4): qty 1

## 2018-06-26 MED ORDER — THIAMINE HCL 100 MG/ML IJ SOLN
100.0000 mg | Freq: Every day | INTRAMUSCULAR | Status: DC
  Administered 2018-06-26: 11:00:00 100 mg via INTRAVENOUS
  Filled 2018-06-26 (×2): qty 2

## 2018-06-26 MED ORDER — IPRATROPIUM-ALBUTEROL 0.5-2.5 (3) MG/3ML IN SOLN: 3.0000 mL | Freq: Four times a day (QID) | RESPIRATORY_TRACT | Status: DC | PRN

## 2018-06-26 MED ORDER — ACETAMINOPHEN 650 MG RE SUPP: 650.0000 mg | Freq: Four times a day (QID) | RECTAL | Status: DC | PRN

## 2018-06-26 MED ORDER — DOCUSATE SODIUM 100 MG PO CAPS
100.0000 mg | ORAL_CAPSULE | Freq: Every day | ORAL | Status: DC
  Administered 2018-06-26 – 2018-06-27 (×2): 100 mg via ORAL
  Filled 2018-06-26 (×2): qty 1

## 2018-06-26 MED ORDER — ONDANSETRON HCL 4 MG PO TABS: 4.0000 mg | ORAL_TABLET | Freq: Four times a day (QID) | ORAL | Status: DC | PRN

## 2018-06-26 MED ORDER — SODIUM CHLORIDE 0.9 % IV SOLN: 2.0000 g | Freq: Once | INTRAVENOUS | Status: DC

## 2018-06-26 NOTE — Progress Notes (Signed)
Patient ID: Sabrina Burke, female   DOB: 11-05-1927, 85 y.o.   MRN: [id number]  [employer] PROGRESS NOTE  Sabrina Burke KNL:[id number] DOB: [birth year] DOA: 06/26/2018 PCP: System, Pcp Not In  HPI/Subjective: Patient feels okay.  Told me she lives alone even though admitting physician documented that she is at [facility].  No complaints of burning on urination.  Audible wheezing heard.  No abdominal pain or shortness of breath.  Objective: Vitals:   06/26/18 0435 06/26/18 0554  BP: (!) 165/78 (!) 174/83  Pulse: 89 74  Resp: (!) 21 18  Temp:  97.8 F (36.6 C)  SpO2: 97% 93%    Intake/Output Summary (Last 24 hours) at 06/26/2018 1438 Last data filed at 06/26/2018 1409 Gross per 24 hour  Intake 505.8 ml  Output -  Net 505.8 ml   Filed Weights   06/26/18 0044 06/26/18 0554  Weight: 59.9 kg 61.4 kg    ROS: Review of Systems  Unable to perform ROS: Dementia  Respiratory: Negative for shortness of breath.   Cardiovascular: Negative for chest pain.  Gastrointestinal: Negative for abdominal pain, diarrhea, nausea and vomiting.   Exam: Physical Exam  HENT:  Nose: No mucosal edema.  Mouth/Throat: No oropharyngeal exudate or posterior oropharyngeal edema.  Eyes: Pupils are equal, round, and reactive to light. Conjunctivae, EOM and lids are normal.  Neck: No JVD present. Carotid bruit is not present. No edema present. No thyroid mass and no thyromegaly present.  Cardiovascular: S1 normal and S2 normal. Exam reveals no gallop.  No murmur heard. Pulses:      Dorsalis pedis pulses are 2+ on the right side and 2+ on the left side.  Respiratory: No respiratory distress. She has decreased breath sounds in the right lower field and the left lower field. She has wheezes in the right lower field and the left lower field. She has no rhonchi. She has no rales.  GI: Soft. Bowel sounds are normal. There is no abdominal tenderness.  Musculoskeletal:     Right ankle: She exhibits no  swelling.     Left ankle: She exhibits no swelling.  Lymphadenopathy:    She has no cervical adenopathy.  Neurological: She is alert.  Skin: Skin is warm. No rash noted. Nails show no clubbing.  Psychiatric: She has a normal mood and affect.      Data Reviewed: Basic Metabolic Panel: Recent Labs  Lab 06/26/18 0058 06/26/18 0607  NA 137 141  K 4.1 4.9  CL 106 107  CO2 24 27  GLUCOSE 140* 129*  BUN 40* 37*  CREATININE 1.42* 1.49*  CALCIUM 8.3* 8.6*   Liver Function Tests: Recent Labs  Lab 06/26/18 0058  AST 18  ALT 15  ALKPHOS 55  BILITOT 0.5  PROT 6.4*  ALBUMIN 2.5*   CBC: Recent Labs  Lab 06/26/18 0058 06/26/18 0607  WBC 11.5* 10.5  NEUTROABS 8.7*  --   HGB 9.4* 10.0*  HCT 28.5* 31.2*  MCV 99.7 100.3*  PLT 240 241     Recent Results (from the past 240 hour(s))  Blood Culture (routine x 2)     Status: None (Preliminary result)   Collection Time: 06/26/18 12:58 AM  Result Value Ref Range Status   Specimen Description BLOOD BLOOD LEFT HAND  Final   Special Requests   Final    BOTTLES DRAWN AEROBIC AND ANAEROBIC Blood Culture adequate volume   Culture   Final    NO GROWTH < 12 HOURS Performed at [facility]  Lab, [address] [place], [place] [address]    Report Status PENDING  Incomplete  Blood Culture (routine x 2)     Status: None (Preliminary result)   Collection Time: 06/26/18 12:58 AM  Result Value Ref Range Status   Specimen Description BLOOD BLOOD RIGHT HAND  Final   Special Requests   Final    BOTTLES DRAWN AEROBIC AND ANAEROBIC Blood Culture adequate volume   Culture   Final    NO GROWTH < 12 HOURS Performed at [facility], [address] [place], [place] [address]    Report Status PENDING  Incomplete  SARS Coronavirus 2 (CEPHEID- Performed in [facility]), Hosp Order     Status: None   Collection Time: 06/26/18 12:58 AM  Result Value Ref Range Status   SARS Coronavirus 2 NEGATIVE NEGATIVE Final     Comment: (NOTE) If result is NEGATIVE SARS-CoV-2 target nucleic acids are NOT DETECTED. The SARS-CoV-2 RNA is generally detectable in upper and lower  respiratory specimens during the acute phase of infection. The lowest  concentration of SARS-CoV-2 viral copies this assay can detect is 250  copies / mL. A negative result does not preclude SARS-CoV-2 infection  and should not be used as the sole basis for treatment or other  patient management decisions.  A negative result may occur with  improper specimen collection / handling, submission of specimen other  than nasopharyngeal swab, presence of viral mutation(s) within the  areas targeted by this assay, and inadequate number of viral copies  (<250 copies / mL). A negative result must be combined with clinical  observations, patient history, and epidemiological information. If result is POSITIVE SARS-CoV-2 target nucleic acids are DETECTED. The SARS-CoV-2 RNA is generally detectable in upper and lower  respiratory specimens dur ing the acute phase of infection.  Positive  results are indicative of active infection with SARS-CoV-2.  Clinical  correlation with patient history and other diagnostic information is  necessary to determine patient infection status.  Positive results do  not rule out bacterial infection or co-infection with other viruses. If result is PRESUMPTIVE POSTIVE SARS-CoV-2 nucleic acids MAY BE PRESENT.   A presumptive positive result was obtained on the submitted specimen  and confirmed on repeat testing.  While 2019 novel coronavirus  (SARS-CoV-2) nucleic acids may be present in the submitted sample  additional confirmatory testing may be necessary for epidemiological  and / or clinical management purposes  to differentiate between  SARS-CoV-2 and other Sarbecovirus currently known to infect humans.  If clinically indicated additional testing with an alternate test  methodology [phone]([id number]) is advised. The SARS-CoV-2  RNA is generally  detectable in upper and lower respiratory sp ecimens during the acute  phase of infection. The expected result is Negative. Fact Sheet for Patients:  BoilerBrush.com.cyhttps://www.fda.gov/media/136312/download Fact Sheet for Healthcare Providers: https://pope.com/https://www.fda.gov/media/136313/download This test is not yet approved or cleared by the [place] FDA and has been authorized for detection and/or diagnosis of SARS-CoV-2 by FDA under an Emergency Use Authorization (EUA).  This EUA will remain in effect (meaning this test can be used) for the duration of the COVID-19 declaration under Section 564(b)(1) of the Act, 21 U.S.C. section 360bbb-3(b)(1), unless the authorization is terminated or revoked sooner. Performed at [facility], [address] [place], [place] [address]      Studies: Dg Chest [place] 1 View  Result Date: 06/26/2018 CLINICAL DATA:  85 year old female with dyspnea. EXAM: PORTABLE CHEST 1 VIEW COMPARISON:  None.  FINDINGS: Evaluation is very limited, almost nondiagnostic due to positioning of the patient. The visualized upper lung is clear. No definite pneumothorax identified. There is atherosclerotic calcification of the aortic arch. Osteopenia with degenerative changes of the spine. IMPRESSION: Very limited, almost nondiagnostic study. No definite abnormality identified in the visualized upper lungs. Repeat radiograph with better positioning of the patient is recommended Electronically Signed   By: Elgie CollardArash  Radparvar M.D.   On: 06/26/2018 02:15    Scheduled Meds: . amLODipine  5 mg Oral Daily  . atenolol  25 mg Oral Daily  . cholecalciferol  2,000 Units Oral Daily  . docusate sodium  100 mg Oral QHS  . enoxaparin (LOVENOX) injection  30 mg Subcutaneous Q24H  . folic acid  1 mg Intravenous Daily  . levothyroxine  88 mcg Oral Q0600  . LORazepam  0.25 mg Oral BID  . multivitamin with minerals  1 tablet Oral Daily  . sodium chloride flush  3 mL Intravenous Q12H  .  thiamine injection  100 mg Intravenous Daily   Continuous Infusions: . ceFEPime (MAXIPIME) IV 1 g (06/26/18 1347)    Assessment/Plan:  1. Clinical sepsis with acute cystitis.  Patient is on broad-spectrum antibiotic.  Hopefully can fine-tune antibiotic once urine culture back. 2. Acute cystitis on broad-spectrum antibiotic.  Follow-up cultures 3. Acute kidney injury.  Will discontinue IV fluids since the patient is now wheezing 4. Wheeze.  Will start nebulizer treatments and monitor.  Discontinue IV fluids 5. Hypothyroidism unspecified on levothyroxine 6. Hypertension on no amlodipine and atenolol 7. Dementia 8. Physical therapy evaluation  Code Status:     Code Status Orders  (From admission, onward)         Start     Ordered   06/26/18 0626  Do not attempt resuscitation (DNR)  Continuous    Question Answer Comment  In the event of cardiac or respiratory ARREST Do not call a "code blue"   In the event of cardiac or respiratory ARREST Do not perform Intubation, CPR, defibrillation or ACLS   In the event of cardiac or respiratory ARREST Use medication by any route, position, wound care, and other measures to relive pain and suffering. May use oxygen, suction and manual treatment of airway obstruction as needed for comfort.      06/26/18 0626        Code Status History    Date Active Date Inactive Code Status Order ID Comments User Context   06/26/2018 0412 06/26/2018 0626 Full Code [id number]  Pearletha AlfredSeals, Angela H, NP ED    Advance Directive Documentation     Most Recent Value  Type of Advance Directive  Out of facility DNR (pink MOST or yellow form)  Pre-existing out of facility DNR order (yellow form or pink MOST form)  Yellow form placed in chart (order not valid for inpatient use)  "MOST" Form in Place?  -     Family Communication: Left message for daughter Disposition Plan: To be determined  Antibiotics:  Cefepime  Time spent: 35 minutes  Twisha Vanpelt [employer]  [employer]

## 2018-06-26 NOTE — ED Provider Notes (Signed)
[facility] Emergency Department Provider Note   I have reviewed the triage vital signs and the nursing notes.  Level 5 caveat history review of system and physical exam limited secondary to dementia HISTORY  Chief Complaint Fever   HPI Sabrina Burke is a 85 y.o. female presents emergency department via [facility] from [place] with reported fever temperature of 104  EMS did not receive any additional history from the [employer].        Past Medical History:  Diagnosis Date  . Hypertension     Patient Active Problem List   Diagnosis Date Noted  . Sepsis ([place]) 06/26/2018    History reviewed. No pertinent surgical history.  Prior to Admission medications   Medication Sig Start Date End Date Taking? Authorizing Provider  acetaminophen (TYLENOL) 325 MG tablet Take 650 mg by mouth every 6 (six) hours as needed for mild pain or fever.   Yes [provider]  alum & mag hydroxide-simeth (MAALOX/MYLANTA) 200-200-20 MG/5ML suspension Take 30 mLs by mouth as needed for indigestion or heartburn.   Yes [provider]  amLODipine (NORVASC) 5 MG tablet Take 5 mg by mouth daily.   Yes [provider]  atenolol (TENORMIN) 25 MG tablet Take 25 mg by mouth daily.   Yes [provider]  bisacodyl (DULCOLAX) 5 MG EC tablet Take 5 mg by mouth daily as needed for moderate constipation.   Yes [provider]  cholecalciferol (VITAMIN D) 1000 units tablet Take 2,000 Units by mouth daily.    Yes [provider]  docusate sodium (COLACE) 100 MG capsule Take 100 mg by mouth at bedtime.   Yes [provider]  guaiFENesin (ROBITUSSIN) 100 MG/5ML liquid Take 200 mg by mouth 3 (three) times daily as needed for cough.   Yes [provider]  levothyroxine (SYNTHROID) 88 MCG tablet Take 88 mcg by mouth daily before breakfast.    Yes [provider]  loperamide (IMODIUM) 2 MG  capsule Take 2 mg by mouth as needed for diarrhea or loose stools.   Yes [provider]  LORazepam (ATIVAN) 0.5 MG tablet Take 0.25 mg by mouth 2 (two) times daily. Takes at 1400 and 2100   Yes [provider]  LORazepam (ATIVAN) 0.5 MG tablet Take 0.5 mg by mouth 2 (two) times daily as needed for anxiety (agitation).   Yes [provider]  magnesium hydroxide (MILK OF MAGNESIA) 400 MG/5ML suspension Take 30 mLs by mouth daily as needed for mild constipation.   Yes [provider]  Multiple Vitamin (DAILY VITE PO) Take 1 tablet by mouth daily.   Yes [provider]  neomycin-bacitracin-polymyxin (NEOSPORIN) ointment Apply 1 application topically as needed for wound care.   Yes [provider]    Allergies Patient has no known allergies.  No family history on file.  Social History Social History   Tobacco Use  . Smoking status: Unknown If Ever Smoked  Substance Use Topics  . Alcohol use: Not Currently  . Drug use: Not Currently    Review of Systems Constitutional: Positive for fever   ____________________________________________   PHYSICAL EXAM:  VITAL SIGNS: ED Triage Vitals  Enc Vitals Group     BP      Pulse      Resp      Temp      Temp src      SpO2      Weight  Height      Head Circumference      Peak Flow      Pain Score      Pain Loc      Pain Edu?      Excl. in GC?     Constitutional: Alert, ill appearing  Eyes: Conjunctivae are normal.  Head: Atraumatic. Mouth/Throat: Mucous membranes are moist.  Oropharynx non-erythematous. Neck: No stridor.   Cardiovascular: Normal rate, regular rhythm. Good peripheral circulation. Grossly normal heart sounds. Respiratory: Tachypnea.  No retractions. No audible wheezing. Gastrointestinal: Soft and nontender. No distention.  Musculoskeletal: No lower extremity tenderness nor edema. No gross deformities of extremities. Neurologic:   No gross focal  neurologic deficits are appreciated.  Skin:  Skin is hot to touch.  No rash noted.   ____________________________________________   LABS (all labs ordered are listed, but only abnormal results are displayed)  Labs Reviewed  COMPREHENSIVE METABOLIC PANEL - Abnormal; Notable for the following components:      Result Value   Glucose, Bld 140 (*)    BUN 40 (*)    Creatinine, Ser 1.42 (*)    Calcium 8.3 (*)    Total Protein 6.4 (*)    Albumin 2.5 (*)    GFR calc non Af Amer 33 (*)    GFR calc Af Amer 38 (*)    All other components within normal limits  CBC WITH DIFFERENTIAL/PLATELET - Abnormal; Notable for the following components:   WBC 11.5 (*)    RBC 2.86 (*)    Hemoglobin 9.4 (*)    HCT 28.5 (*)    Neutro Abs 8.7 (*)    All other components within normal limits  URINALYSIS, ROUTINE W REFLEX MICROSCOPIC - Abnormal; Notable for the following components:   Color, Urine YELLOW (*)    APPearance CLOUDY (*)    Hgb urine dipstick SMALL (*)    Protein, ur 100 (*)    Nitrite POSITIVE (*)    Leukocytes,Ua MODERATE (*)    WBC, UA >50 (*)    Bacteria, UA MANY (*)    All other components within normal limits  BLOOD GAS, VENOUS - Abnormal; Notable for the following components:   pCO2, Ven 42 (*)    pO2, Ven 58.0 (*)    Acid-Base Excess 3.2 (*)    All other components within normal limits  SARS CORONAVIRUS 2 (HOSPITAL ORDER, PERFORMED IN [facility])  CULTURE, BLOOD (ROUTINE X 2)  CULTURE, BLOOD (ROUTINE X 2)  URINE CULTURE  URINE CULTURE  LACTIC ACID, PLASMA  LACTIC ACID, PLASMA  PROTIME-INR  CORTISOL-AM, BLOOD  PROCALCITONIN  CBC  CREATININE, SERUM  TSH  BASIC METABOLIC PANEL  CBC  OCCULT BLOOD X 1 CARD TO LAB, STOOL   ____________________________________________  EKG  ED ECG REPORT I, [place] Aryona Sill, the attending physician, personally viewed and interpreted this ECG.   Date: 06/26/2018  EKG Time: 12:54 AM  Rate: 90  Rhythm: Normal sinus rhythm   Axis: Normal  Intervals: Normal  ST&T Change: None  ____________________________________________  RADIOLOGY I, [place] Bry[place] Dray, personally viewed and evaluated these images (plain radiographs) as part of my medical decision making, as well as reviewing the written report by the radiologist.  ED MD interpretation: No definitive abnormality noted on x-ray per radiologist however limited secondary to patient positioning  Official radiology report(s): Dg Chest Port 1 View  Result Date: 06/26/2018 CLINICAL DATA:  85 year old female with dyspnea. EXAM: PORTABLE CHEST 1 VIEW COMPARISON:  None.  FINDINGS: Evaluation is very limited, almost nondiagnostic due to positioning of the patient. The visualized upper lung is clear. No definite pneumothorax identified. There is atherosclerotic calcification of the aortic arch. Osteopenia with degenerative changes of the spine. IMPRESSION: Very limited, almost nondiagnostic study. No definite abnormality identified in the visualized upper lungs. Repeat radiograph with better positioning of the patient is recommended Electronically Signed   By: Elgie CollardArash  Radparvar M.D.   On: 06/26/2018 02:15    .Critical Care Performed by: Darci CurrentBrown, [place] N, MD Authorized by: Darci CurrentBrown, [place] N, MD   Critical care provider statement:    Critical care time (minutes):  30   Critical care time was exclusive of:  Separately billable procedures and treating other patients   Critical care was necessary to treat or prevent imminent or life-threatening deterioration of the following conditions:  Sepsis   Critical care was time spent personally by me on the following activities:  Development of treatment plan with patient or surrogate, discussions with consultants, evaluation of patient's response to treatment, examination of patient, obtaining history from patient or surrogate, ordering and performing treatments and interventions, ordering and review of laboratory studies, ordering and  review of radiographic studies, pulse oximetry, re-evaluation of patient's condition and review of old charts     ____________________________________________   INITIAL IMPRESSION / MDM / ASSESSMENT AND PLAN / ED COURSE  As part of my medical decision making, I reviewed the following data within the electronic MEDICAL RECORD NUMBER  85 year old female presented with above-stated history and physical exam secondary to fever with suspicion for possible sepsis.  Laboratory data notable for BUN of 40 with a creatinine of 1.42, white blood cell count 11.5 lactic acid of Urinalysis consistent with a urinary tract infection.  Patient COVID negative.  Patient given appropriate broad-spectrum antibiotic coverage shortly after arrival.  Patient discussed with Dr. Arville CareMansy for hospital admission for further evaluation and management  *Sabrina Burke was evaluated in Emergency Department on 06/26/2018 for the symptoms described in the history of present illness. She was evaluated in the context of the global COVID-19 pandemic, which necessitated consideration that the patient might be at risk for infection with the SARS-CoV-2 virus that causes COVID-19. Institutional protocols and algorithms that pertain to the evaluation of patients at risk for COVID-19 are in a state of rapid change based on information released by regulatory bodies including the CDC and federal and state organizations. These policies and algorithms were followed during the patient's care in the ED.  Some ED evaluations and interventions may be delayed as a result of limited staffing during the pandemic.*        ____________________________________________  FINAL CLINICAL IMPRESSION(S) / ED DIAGNOSES  Final diagnoses:  Sepsis, due to unspecified organism, unspecified whether acute organ dysfunction present (HCC)  Acute cystitis without hematuria     MEDICATIONS GIVEN DURING THIS VISIT:  Medications  alum & mag hydroxide-simeth  (MAALOX/MYLANTA) 200-200-20 MG/5ML suspension 30 mL (has no administration in time range)  amLODipine (NORVASC) tablet 5 mg (has no administration in time range)  atenolol (TENORMIN) tablet 25 mg (has no administration in time range)  cholecalciferol (VITAMIN D) tablet 2,000 Units (has no administration in time range)  docusate sodium (COLACE) capsule 100 mg (has no administration in time range)  levothyroxine (SYNTHROID) tablet 88 mcg (has no administration in time range)  LORazepam (ATIVAN) tablet 0.25 mg (has no administration in time range)  enoxaparin (LOVENOX) injection 40 mg (has no administration in time range)  sodium chloride flush (  NS) 0.9 % injection 3 mL (has no administration in time range)  0.9 %  sodium chloride infusion (has no administration in time range)  ceFEPIme (MAXIPIME) 2 g in sodium chloride 0.9 % 100 mL IVPB (has no administration in time range)  acetaminophen (TYLENOL) tablet 650 mg (has no administration in time range)    Or  acetaminophen (TYLENOL) suppository 650 mg (has no administration in time range)  polyethylene glycol (MIRALAX / GLYCOLAX) packet 17 g (has no administration in time range)  ondansetron (ZOFRAN) tablet 4 mg (has no administration in time range)    Or  ondansetron (ZOFRAN) injection 4 mg (has no administration in time range)  folic acid injection 1 mg (has no administration in time range)  multivitamin with minerals tablet 1 tablet (has no administration in time range)  thiamine (B-1) injection 100 mg (has no administration in time range)  ceFEPIme (MAXIPIME) 1 g in sodium chloride 0.9 % 100 mL IVPB (has no administration in time range)  ceFEPIme (MAXIPIME) 2 g in sodium chloride 0.9 % 100 mL IVPB (0 g Intravenous Stopped 06/26/18 0148)  metroNIDAZOLE (FLAGYL) IVPB 500 mg (0 mg Intravenous Stopped 06/26/18 0228)  vancomycin (VANCOCIN) IVPB 1000 mg/200 mL premix (0 mg Intravenous Stopped 06/26/18 0238)     ED Discharge Orders    None        Note:  This document was prepared using Dragon voice recognition software and may include unintentional dictation errors.   Darci CurrentBrown, [place] N, MD 06/26/18 0500

## 2018-06-26 NOTE — Progress Notes (Signed)
PT Cancellation Note  Patient Details Name: Sabrina Burke MRN: [id number] DOB: 21-Jan-1927   Cancelled Treatment:    Reason Eval/Treat Not Completed: Other (comment). Consult received and chart reviewed. Upon arrival, pt with eyes closed, however awakens with tactile cues. Keeps eyes closed during attempted evaluation. Does not follow commands and only oriented to self. Able to squeeze therapist hands, however no purposeful movement noted in B LE. Unable to complete formal evaluation. Will re-attempt next date.   Shenise Wolgamott 06/26/2018, 3:28 PM  Greggory Stallion, PT, DPT [phone]

## 2018-06-26 NOTE — ED Triage Notes (Signed)
Pt arrives via [facility] with c/o fever of 104 from [facility]. Per EMS, pt had temp of 102.2. Pt is answering yes and no questions with this RN at this time.

## 2018-06-26 NOTE — Progress Notes (Signed)
CODE SEPSIS - PHARMACY COMMUNICATION  **Broad Spectrum Antibiotics should be administered within 1 hour of Sepsis diagnosis**  Time Code Sepsis Called/Page Received:   6/6 @ 0041   Antibiotics Ordered: Vanc, Cefepime   Time of 1st antibiotic administration: 6/6 @ 0118   Additional action taken by pharmacy: none   If necessary, Name of Provider/Nurse Contacted:     Ryver Zadrozny D ,PharmD Clinical Pharmacist  06/26/2018  1:41 AM

## 2018-06-26 NOTE — Progress Notes (Signed)
Anticoagulation monitoring(Lovenox):  85 yo female ordered Lovenox 40 mg Q24h  Filed Weights   06/26/18 0044  Weight: 132 lb (59.9 kg)   BMI    Lab Results  Component Value Date   CREATININE 1.42 (H) 06/26/2018   Estimated Creatinine Clearance: 25.4 mL/min (A) (by C-G formula based on SCr of 1.42 mg/dL (H)). Hemoglobin & Hematocrit     Component Value Date/Time   HGB 9.4 (L) 06/26/2018 0058   HCT 28.5 (L) 06/26/2018 1962     Per Protocol for Patient with estCrcl < 30 ml/min and BMI < 40, will transition to Lovenox 30 mg Q24h.

## 2018-06-26 NOTE — Progress Notes (Signed)
Pharmacy Antibiotic Note  Sabrina Burke is a 85 y.o. female admitted on 06/26/2018 with UTI.  Pharmacy has been consulted for Cefepime dosing. CrCl = 25.4 ml/min  Plan: Cefepime 2 gm IV X 1 given in ED on 6/6 @ 0100. Cefepime 1 gm IV Q12H ordered to start on 6/6 @ 1300.    Height: 5\' 8"  (172.7 cm) Weight: 132 lb (59.9 kg) IBW/kg (Calculated) : 63.9  Temp (24hrs), Avg:101.7 F (38.7 C), Min:101.7 F (38.7 C), Max:101.7 F (38.7 C)  Recent Labs  Lab 06/26/18 0058 06/26/18 0316  WBC 11.5*  --   CREATININE 1.42*  --   LATICACIDVEN 0.7 1.0    Estimated Creatinine Clearance: 25.4 mL/min (A) (by C-G formula based on SCr of 1.42 mg/dL (H)).    No Known Allergies  Antimicrobials this admission:   >>    >>   Dose adjustments this admission:   Microbiology results:  BCx:   UCx:    Sputum:    MRSA PCR:   Thank you for allowing pharmacy to be a part of this patient's care.  Leasia Swann D 06/26/2018 4:24 AM

## 2018-06-26 NOTE — ED Notes (Signed)
ED TO INPATIENT HANDOFF REPORT  ED Nurse Name and Phone #: Lurena JoinerRebecca [phone]  S Name/Age/Gender Sabrina Burke 85 y.o. female Room/Bed: ED25A/ED25A  Code Status   Code Status: Full Code  Home/SNF/Other Skilled nursing facility Patient oriented to: self Is this baseline? Yes   Triage Complete: Triage complete  Chief Complaint Fever Cough  Triage Note Pt arrives via [facility] with c/o fever of 104 from [facility]. Per EMS, pt had temp of 102.2. Pt is answering yes and no questions with this RN at this time.    Allergies No Known Allergies  Level of Care/Admitting Diagnosis ED Disposition    ED Disposition Condition Comment   Admit  Hospital Area: [facility] [[id number]]  Level of Care: Med-Surg [16]  Covid Evaluation: Confirmed COVID Negative  Diagnosis: Sepsis [facility](HCC) [[id number]]) [[id number]]  Admitting Physician: Hannah BeatMANSY, JAN A [[id number]]  Attending Physician: Hannah BeatMANSY, JAN A [[id number]]  Estimated length of stay: 3 - 4 days  Certification:: I certify this patient will need inpatient services for at least 2 midnights  PT Class (Do Not Modify): Inpatient [101]  PT Acc Code (Do Not Modify): Private [1]       B Medical/Surgery History Past Medical History:  Diagnosis Date  . Hypertension    History reviewed. No pertinent surgical history.   A IV Location/Drains/Wounds Patient Lines/Drains/Airways Status   Active Line/Drains/Airways    Name:   Placement date:   Placement time:   Site:   Days:   Peripheral IV 06/26/18 Left Hand   06/26/18    0110    Hand   less than 1   Peripheral IV 06/26/18 Right Hand   06/26/18    0110    Hand   less than 1          Intake/Output Last 24 hours No intake or output data in the 24 hours ending 06/26/18 0500  Labs/Imaging Results for orders placed or performed during the hospital encounter of 06/26/18 (from the past 48 hour(s))  Lactic acid, plasma     Status: None   Collection Time: 06/26/18 12:58 AM  Result Value Ref Range    Lactic Acid, Venous 0.7 0.5 - 1.9 mmol/L    Comment: Performed at [facility], [address] [place], [place] [address]  Comprehensive metabolic panel     Status: Abnormal   Collection Time: 06/26/18 12:58 AM  Result Value Ref Range   Sodium 137 135 - 145 mmol/L   Potassium 4.1 3.5 - 5.1 mmol/L   Chloride 106 98 - 111 mmol/L   CO2 24 22 - 32 mmol/L   Glucose, Bld 140 (H) 70 - 99 mg/dL   BUN 40 (H) 8 - 23 mg/dL   Creatinine, Ser 5.281.42 (H) 0.44 - 1.00 mg/dL   Calcium 8.3 (L) 8.9 - 10.3 mg/dL   Total Protein 6.4 (L) 6.5 - 8.1 g/dL   Albumin 2.5 (L) 3.5 - 5.0 g/dL   AST 18 15 - 41 U/L   ALT 15 0 - 44 U/L   Alkaline Phosphatase 55 38 - 126 U/L   Total Bilirubin 0.5 0.3 - 1.2 mg/dL   GFR calc non Af Amer 33 (L) >60 mL/min   GFR calc Af Amer 38 (L) >60 mL/min   Anion gap 7 5 - 15    Comment: Performed at [facility], [address] [place], [place] [address]  CBC WITH DIFFERENTIAL     Status: Abnormal   Collection Time: 06/26/18 12:58 AM  Result Value Ref Range   WBC 11.5 (H) 4.0 - 10.5 K/uL   RBC 2.86 (L) 3.87 - 5.11 MIL/uL   Hemoglobin 9.4 (L) 12.0 - 15.0 g/dL   HCT 16.128.5 (L) 09.636.0 - 04.546.0 %   MCV 99.7 80.0 - 100.0 fL   MCH 32.9 26.0 - 34.0 pg   MCHC 33.0 30.0 - 36.0 g/dL   RDW 40.911.6 81.111.5 - 91.415.5 %   Platelets 240 150 - 400 K/uL   nRBC 0.0 0.0 - 0.2 %   Neutrophils Relative % 75 %   Neutro Abs 8.7 (H) 1.7 - 7.7 K/uL   Lymphocytes Relative 15 %   Lymphs Abs 1.7 0.7 - 4.0 K/uL   Monocytes Relative 9 %   Monocytes Absolute 1.0 0.1 - 1.0 K/uL   Eosinophils Relative 1 %   Eosinophils Absolute 0.1 0.0 - 0.5 K/uL   Basophils Relative 0 %   Basophils Absolute 0.0 0.0 - 0.1 K/uL   Immature Granulocytes 0 %   Abs Immature Granulocytes 0.05 0.00 - 0.07 K/uL    Comment: Performed at [facility], [address] [place], [place] [address]  Urinalysis, Routine w reflex microscopic     Status: Abnormal   Collection Time: 06/26/18 12:58 AM  Result Value Ref  Range   Color, Urine YELLOW (A) YELLOW   APPearance CLOUDY (A) CLEAR   Specific Gravity, Urine 1.011 1.005 - 1.030   pH 5.0 5.0 - 8.0   Glucose, UA NEGATIVE NEGATIVE mg/dL   Hgb urine dipstick SMALL (A) NEGATIVE   Bilirubin Urine NEGATIVE NEGATIVE   Ketones, ur NEGATIVE NEGATIVE mg/dL   Protein, ur 621100 (A) NEGATIVE mg/dL   Nitrite POSITIVE (A) NEGATIVE   Leukocytes,Ua MODERATE (A) NEGATIVE   RBC / HPF 6-10 0 - 5 RBC/hpf   WBC, UA >50 (H) 0 - 5 WBC/hpf   Bacteria, UA MANY (A) NONE SEEN   Squamous Epithelial / LPF 0-5 0 - 5   WBC Clumps PRESENT     Comment: Performed at [facility], [address] [place], [place] [address]  Blood gas, venous ([facility], [facility], [facility])     Status: Abnormal   Collection Time: 06/26/18 12:58 AM  Result Value Ref Range   pH, Ven 7.43 7.250 - 7.430   pCO2, Ven 42 (L) 44.0 - 60.0 mmHg   pO2, Ven 58.0 (H) 32.0 - 45.0 mmHg   Bicarbonate 27.9 20.0 - 28.0 mmol/L   Acid-Base Excess 3.2 (H) 0.0 - 2.0 mmol/L   O2 Saturation 90.5 %   Patient temperature 37.0    Collection site VEIN    Sample type VENOUS     Comment: Performed at [facility], [address] [place], [place] [address]  SARS Coronavirus 2 (CEPHEID- Performed in [facility] hospital lab), Hosp Order     Status: None   Collection Time: 06/26/18 12:58 AM  Result Value Ref Range   SARS Coronavirus 2 NEGATIVE NEGATIVE    Comment: (NOTE) If result is NEGATIVE SARS-CoV-2 target nucleic acids are NOT DETECTED. The SARS-CoV-2 RNA is generally detectable in upper and lower  respiratory specimens during the acute phase of infection. The lowest  concentration of SARS-CoV-2 viral copies this assay can detect is 250  copies / mL. A negative result does not preclude SARS-CoV-2 infection  and should not be used as the sole basis for treatment or other  patient management decisions.  A negative result may occur with  improper specimen collection / handling,  submission of specimen other  than  nasopharyngeal swab, presence of viral mutation(s) within the  areas targeted by this assay, and inadequate number of viral copies  (<250 copies / mL). A negative result must be combined with clinical  observations, patient history, and epidemiological information. If result is POSITIVE SARS-CoV-2 target nucleic acids are DETECTED. The SARS-CoV-2 RNA is generally detectable in upper and lower  respiratory specimens dur ing the acute phase of infection.  Positive  results are indicative of active infection with SARS-CoV-2.  Clinical  correlation with patient history and other diagnostic information is  necessary to determine patient infection status.  Positive results do  not rule out bacterial infection or co-infection with other viruses. If result is PRESUMPTIVE POSTIVE SARS-CoV-2 nucleic acids MAY BE PRESENT.   A presumptive positive result was obtained on the submitted specimen  and confirmed on repeat testing.  While 2019 novel coronavirus  (SARS-CoV-2) nucleic acids may be present in the submitted sample  additional confirmatory testing may be necessary for epidemiological  and / or clinical management purposes  to differentiate between  SARS-CoV-2 and other Sarbecovirus currently known to infect humans.  If clinically indicated additional testing with an alternate test  methodology [phone]) is advised. The SARS-CoV-2 RNA is generally  detectable in upper and lower respiratory sp ecimens during the acute  phase of infection. The expected result is Negative. Fact Sheet for Patients:  BoilerBrush.com.cy Fact Sheet for Healthcare Providers: https://pope.com/ This test is not yet approved or cleared by the [place] FDA and has been authorized for detection and/or diagnosis of SARS-CoV-2 by FDA under an Emergency Use Authorization (EUA).  This EUA will remain in effect (meaning this test can be used) for the duration of  the COVID-19 declaration under Section 564(b)(1) of the Act, 21 U.S.C. section 360bbb-3(b)(1), unless the authorization is terminated or revoked sooner. Performed at [facility], [address] [place], [place] [address]   Lactic acid, plasma     Status: None   Collection Time: 06/26/18  3:16 AM  Result Value Ref Range   Lactic Acid, Venous 1.0 0.5 - 1.9 mmol/L    Comment: Performed at [facility], [address], [place], [place] [address]   Dg Chest Port 1 View  Result Date: 06/26/2018 CLINICAL DATA:  85 year old female with dyspnea. EXAM: PORTABLE CHEST 1 VIEW COMPARISON:  None. FINDINGS: Evaluation is very limited, almost nondiagnostic due to positioning of the patient. The visualized upper lung is clear. No definite pneumothorax identified. There is atherosclerotic calcification of the aortic arch. Osteopenia with degenerative changes of the spine. IMPRESSION: Very limited, almost nondiagnostic study. No definite abnormality identified in the visualized upper lungs. Repeat radiograph with better positioning of the patient is recommended Electronically Signed   By: Elgie Collard M.D.   On: 06/26/2018 02:15    Pending Labs Unresulted Labs (From admission, onward)    Start     Ordered   07/03/18 0500  Creatinine, serum  (enoxaparin (LOVENOX)    CrCl >/= 30 ml/min)  Weekly,   STAT    Comments:  while on enoxaparin therapy    06/26/18 0412   06/26/18 0500  Protime-INR  Tomorrow morning,   STAT     06/26/18 0412   06/26/18 0500  Cortisol-am, blood  Tomorrow morning,   STAT     06/26/18 0412   06/26/18 0500  Procalcitonin  Tomorrow morning,   STAT     06/26/18 0412   06/26/18 0500  Basic metabolic panel  Tomorrow morning,   STAT     06/26/18 0412   06/26/18 0500  CBC  Tomorrow morning,   STAT     06/26/18 0412   06/26/18 0425  Occult blood card to lab, stool RN will collect  Once,   STAT    Question:  Specimen to be collected by?  Answer:  RN will collect    06/26/18 0424   06/26/18 0413  CBC  (enoxaparin (LOVENOX)    CrCl >/= 30 ml/min)  Once,   STAT    Comments:  Baseline for enoxaparin therapy IF NOT ALREADY DRAWN.  Notify MD if PLT < 100 K.    06/26/18 0412   06/26/18 0413  Creatinine, serum  (enoxaparin (LOVENOX)    CrCl >/= 30 ml/min)  Once,   STAT    Comments:  Baseline for enoxaparin therapy IF NOT ALREADY DRAWN.    06/26/18 0412   06/26/18 0413  TSH  Once,   STAT     06/26/18 0412   06/26/18 0413  Urine culture  Once,   STAT    Question:  Patient immune status  Answer:  Normal   06/26/18 0412   06/26/18 0042  Urine culture  ONCE - STAT,   STAT     06/26/18 0042   06/26/18 0041  Blood Culture (routine x 2)  BLOOD CULTURE X 2,   STAT     06/26/18 0042          Vitals/Pain Today's Vitals   06/26/18 0238 06/26/18 0323 06/26/18 0426 06/26/18 0435  BP: 124/71 138/64  (!) 165/78  Pulse: 85 85  89  Resp: (!) 24 (!) 21  (!) 21  Temp:   (!) 100.9 F (38.3 C)   TempSrc:   Rectal   SpO2: 96% 96%  97%  Weight:      Height:        Isolation Precautions Droplet and Contact precautions  Medications Medications  alum & mag hydroxide-simeth (MAALOX/MYLANTA) 200-200-20 MG/5ML suspension 30 mL (has no administration in time range)  amLODipine (NORVASC) tablet 5 mg (has no administration in time range)  atenolol (TENORMIN) tablet 25 mg (has no administration in time range)  cholecalciferol (VITAMIN D) tablet 2,000 Units (has no administration in time range)  docusate sodium (COLACE) capsule 100 mg (has no administration in time range)  levothyroxine (SYNTHROID) tablet 88 mcg (has no administration in time range)  LORazepam (ATIVAN) tablet 0.25 mg (has no administration in time range)  enoxaparin (LOVENOX) injection 40 mg (has no administration in time range)  sodium chloride flush (NS) 0.9 % injection 3 mL (has no administration in time range)  0.9 %  sodium chloride infusion (has no administration in time range)  ceFEPIme  (MAXIPIME) 2 g in sodium chloride 0.9 % 100 mL IVPB (has no administration in time range)  acetaminophen (TYLENOL) tablet 650 mg (has no administration in time range)    Or  acetaminophen (TYLENOL) suppository 650 mg (has no administration in time range)  polyethylene glycol (MIRALAX / GLYCOLAX) packet 17 g (has no administration in time range)  ondansetron (ZOFRAN) tablet 4 mg (has no administration in time range)    Or  ondansetron (ZOFRAN) injection 4 mg (has no administration in time range)  folic acid injection 1 mg (has no administration in time range)  multivitamin with minerals tablet 1 tablet (has no administration in time range)  thiamine (B-1) injection 100 mg (has no administration in time range)  ceFEPIme (MAXIPIME) 1 g in sodium chloride 0.9 % 100 mL IVPB (has no administration in time range)  ceFEPIme (MAXIPIME) 2 g in sodium chloride 0.9 % 100 mL IVPB (0 g Intravenous Stopped 06/26/18 0148)  metroNIDAZOLE (FLAGYL) IVPB 500 mg (0 mg Intravenous Stopped 06/26/18 0228)  vancomycin (VANCOCIN) IVPB 1000 mg/200 mL premix (0 mg Intravenous Stopped 06/26/18 0238)    Mobility non-ambulatory High fall risk   Focused Assessments Pulmonary Assessment Handoff:  Lung sounds: L Breath Sounds: Diminished, Expiratory wheezes R Breath Sounds: Diminished, Expiratory wheezes O2 Device: Room Air        R Recommendations: See Admitting Provider Note  Report given to:   Additional Notes:

## 2018-06-26 NOTE — ED Notes (Signed)
Perley, informed of pt's admission status.

## 2018-06-26 NOTE — H&P (Addendum)
[facility] [place] [facility]   PATIENT NAME: Sabrina FasterLavada Goodwill    MR#:  [id number]  DATE OF BIRTH:  07/23/1927  DATE OF ADMISSION:  06/26/2018  PRIMARY CARE PHYSICIAN: System, Pcp Not In   REQUESTING/REFERRING PHYSICIAN: Bayard Malesandolph Brown, MD CHIEF COMPLAINT:   Chief Complaint  Patient presents with  . Fever    HISTORY OF PRESENT ILLNESS:  Sabrina Burke  is a 85 y.o. female with a known history of hypothyroidism, dementia, hypertension, peripheral vascular disease.  Patient arrives to the emergency room from [facility] with reported fever of 104 earlier in the day.  Temperature is 102.2 on her arrival.  At the time that I am seeing the patient, she will open her eyes and seemed to mumble a response however there is no discernible speech.  No noted complaints of chest pain, shortness of breath, complaints of abdominal pain or diarrhea.  No noted hematemesis, hematochezia, or melena.  Urinalysis demonstrates positive nitrites with many bacteria and moderate leukocytes.  WBCs 11.5.  Chest x-ray shows no acute findings however chest x-ray is noted to be of poor quality.  Lactic acid is 0.7.  BUN is 40 with creatinine 1.42.  I do not see documented history of chronic renal failure.  Blood and urine culture is pending.  Hemoglobin is 9.4 with hematocrit 28.5.  Will get stool for occult blood.  We have admitted her to the hospitalist service for further management of sepsis and urinary tract infection.  PAST MEDICAL HISTORY:   Past Medical History:  Diagnosis Date  . Hypertension   Hypothyroidism Peripheral vascular disease Dementia Anxiety PAD Hypertension Vitamin D deficiency PAST SURGICAL HISTORY:  History reviewed. No pertinent surgical history.  SOCIAL HISTORY:   Social History   Tobacco Use  . Smoking status: Unknown If Ever Smoked  Substance Use Topics  . Alcohol use: Not Currently  She is a resident at [facility]  FAMILY HISTORY:  No family history on file.  DRUG ALLERGIES:  No Known Allergies  REVIEW OF SYSTEMS:   ROS Patient has history of dementia and is unable to participate in review of systems  MEDICATIONS AT HOME:   Prior to Admission medications   Medication Sig Start Date End Date Taking? Authorizing Provider  acetaminophen (TYLENOL) 325 MG tablet Take 650 mg by mouth every 6 (six) hours as needed for mild pain or fever.   Yes [provider]  alum & mag hydroxide-simeth (MAALOX/MYLANTA) 200-200-20 MG/5ML suspension Take 30 mLs by mouth as needed for indigestion or heartburn.   Yes [provider]  amLODipine (NORVASC) 5 MG tablet Take 5 mg by mouth daily.   Yes [provider]  atenolol (TENORMIN) 25 MG tablet Take 25 mg by mouth daily.   Yes [provider]  bisacodyl (DULCOLAX) 5 MG EC tablet Take 5 mg by mouth daily as needed for moderate constipation.   Yes [provider]  cholecalciferol (VITAMIN D) 1000 units tablet Take 2,000 Units by mouth daily.    Yes [provider]  docusate sodium (COLACE) 100 MG capsule Take 100 mg by mouth at bedtime.   Yes [provider]  guaiFENesin (ROBITUSSIN) 100 MG/5ML liquid Take 200 mg by mouth 3 (three) times daily as needed for cough.   Yes [provider]  levothyroxine (SYNTHROID) 88 MCG tablet Take 88 mcg by mouth daily before breakfast.    Yes [provider]  loperamide (IMODIUM) 2 MG  capsule Take 2 mg by mouth as needed for diarrhea or loose stools.   Yes [provider]  LORazepam (ATIVAN) 0.5 MG tablet Take 0.25 mg by mouth 2 (two) times daily. Takes at 1400 and 2100   Yes [provider]  LORazepam (ATIVAN) 0.5 MG tablet Take 0.5 mg by mouth 2 (two) times daily as needed for anxiety (agitation).   Yes [provider]  magnesium hydroxide (MILK OF MAGNESIA) 400 MG/5ML suspension Take 30 mLs by mouth daily as needed for mild  constipation.   Yes [provider]  Multiple Vitamin (DAILY VITE PO) Take 1 tablet by mouth daily.   Yes [provider]  neomycin-bacitracin-polymyxin (NEOSPORIN) ointment Apply 1 application topically as needed for wound care.   Yes [provider]      VITAL SIGNS:  Blood pressure 138/64, pulse 85, temperature (!) 101.7 F (38.7 C), temperature source Rectal, resp. rate (!) 21, height 5\' 8"  (1.727 m), weight 59.9 kg, SpO2 96 %.  PHYSICAL EXAMINATION:  Physical Exam Constitutional:      General: She is not in acute distress.    Appearance: She is ill-appearing.     Comments: Patient will open her eyes to verbal stimuli.  However she does not answer questions.  HENT:     Head: Normocephalic.     Right Ear: External ear normal.     Left Ear: External ear normal.     Nose: Nose normal.     Mouth/Throat:     Mouth: Mucous membranes are moist.     Pharynx: Oropharynx is clear.  Eyes:     General: No scleral icterus.    Extraocular Movements: Extraocular movements intact.     Conjunctiva/sclera: Conjunctivae normal.     Pupils: Pupils are equal, round, and reactive to light.  Neck:     Musculoskeletal: Normal range of motion and neck supple.  Cardiovascular:     Rate and Rhythm: Normal rate and regular rhythm.     Pulses: Normal pulses.     Heart sounds: Normal heart sounds. No murmur. No friction rub. No gallop.   Pulmonary:     Breath sounds: Normal breath sounds. No wheezing, rhonchi or rales.  Abdominal:     General: Bowel sounds are normal. There is no distension.     Palpations: Abdomen is soft.     Tenderness: There is no abdominal tenderness.  Musculoskeletal:        General: No swelling.     Right lower leg: Edema (2+ pitting) present.     Left lower leg: Edema (2+ pitting) present.  Skin:    General: Skin is warm and dry.     Capillary Refill: Capillary refill takes less than 2 seconds.     Findings: No erythema or rash.   Neurological:     Mental Status: She is disoriented.     Motor: Weakness present.     Comments: Patient will open her eyes to verbal stimuli however does not respond verbally to questions.       LABORATORY PANEL:   CBC Recent Labs  Lab 06/26/18 0058  WBC 11.5*  HGB 9.4*  HCT 28.5*  PLT 240   ------------------------------------------------------------------------------------------------------------------  Chemistries  Recent Labs  Lab 06/26/18 0058  NA 137  K 4.1  CL 106  CO2 24  GLUCOSE 140*  BUN 40*  CREATININE 1.42*  CALCIUM 8.3*  AST 18  ALT 15  ALKPHOS 55  BILITOT 0.5   ------------------------------------------------------------------------------------------------------------------  Cardiac  Enzymes No results for input(s): TROPONINI in the last 168 hours. ------------------------------------------------------------------------------------------------------------------  RADIOLOGY:  Dg Chest Port 1 View  Result Date: 06/26/2018 CLINICAL DATA:  85 year old female with dyspnea. EXAM: PORTABLE CHEST 1 VIEW COMPARISON:  None. FINDINGS: Evaluation is very limited, almost nondiagnostic due to positioning of the patient. The visualized upper lung is clear. No definite pneumothorax identified. There is atherosclerotic calcification of the aortic arch. Osteopenia with degenerative changes of the spine. IMPRESSION: Very limited, almost nondiagnostic study. No definite abnormality identified in the visualized upper lungs. Repeat radiograph with better positioning of the patient is recommended Electronically Signed   By: Elgie CollardArash  Radparvar M.D.   On: 06/26/2018 02:15      IMPRESSION AND PLAN:   1.  Sepsis - She is receiving normal saline at 75 cc/h. -Likely secondary to urinary tract infection - Patient was treated with broad-spectrum antibiotic therapy including vancomycin and cefepime in the emergency room.  We will continue management for UTI/sepsis with cefepime.   Blood and urine cultures are pending. -We will repeat CBC and BMP in the a.m. - Telemetry monitoring  2. UTI - We will continue cefepime every 8 hours - We will review results of urine culture when available and adjust treatment as indicated  3.  Acute kidney injury - We will continue hydration with normal saline at 75 cc/h - Will repeat BMP in the a.m. and monitor renal function closely  4.  Hypothyroidism - TSH pending - Synthroid restarted  5.  Dementia - Patient is a resident of [facility].  Will plan on returning at the time of discharge. --Fall precautions  DVT and PPI prophylaxis were initiated  All the records are reviewed and case discussed with ED provider. The plan of care was discussed in details with the patient (and family). I answered all questions. The patient agreed to proceed with the above mentioned plan. Further management will depend upon hospital course.   CODE STATUS: Full code  TOTAL TIME TAKING CARE OF THIS PATIENT: 45 minutes.    Milas Kocherngela H Seals CRNP on 06/26/2018 at 3:54 AM  Pager - [phone]  After 6pm go to [email or web address] - Social research officer, governmentpassword [email or web address]  [facility] [place] [facility]  Office  [phone]  CC: Primary care physician; System, Pcp Not In   Note: This dictation was prepared with Dragon dictation along with smaller phrase technology. Any transcriptional errors that result from this process are unintentional.

## 2018-06-27 LAB — BASIC METABOLIC PANEL
Anion gap: 6 (ref 5–15)
BUN: 34 mg/dL — ABNORMAL HIGH (ref 8–23)
CO2: 25 mmol/L (ref 22–32)
Calcium: 8.3 mg/dL — ABNORMAL LOW (ref 8.9–10.3)
Chloride: 110 mmol/L (ref 98–111)
Creatinine, Ser: 1.27 mg/dL — ABNORMAL HIGH (ref 0.44–1.00)
GFR calc Af Amer: 43 mL/min — ABNORMAL LOW (ref >60)
GFR calc non Af Amer: 37 mL/min — ABNORMAL LOW (ref >60)
Glucose, Bld: 98 mg/dL (ref 70–99)
Potassium: 3.6 mmol/L (ref 3.5–5.1)
Sodium: 141 mmol/L (ref 135–145)

## 2018-06-27 MED ORDER — ENSURE ENLIVE PO LIQD
237.0000 mL | Freq: Two times a day (BID) | ORAL | Status: DC
  Administered 2018-06-27 – 2018-06-28 (×2): 237 mL via ORAL

## 2018-06-27 MED ORDER — VITAMIN B-1 100 MG PO TABS
100.0000 mg | ORAL_TABLET | Freq: Every day | ORAL | Status: DC
  Administered 2018-06-27 – 2018-06-28 (×2): 100 mg via ORAL
  Filled 2018-06-27 (×2): qty 1

## 2018-06-27 MED ORDER — SODIUM CHLORIDE 0.9 % IV SOLN
1.0000 g | INTRAVENOUS | Status: DC
  Administered 2018-06-27: 22:00:00 1 g via INTRAVENOUS
  Filled 2018-06-27 (×2): qty 1

## 2018-06-27 MED ORDER — FOLIC ACID 1 MG PO TABS
1.0000 mg | ORAL_TABLET | Freq: Every day | ORAL | Status: DC
  Administered 2018-06-27 – 2018-06-28 (×2): 1 mg via ORAL
  Filled 2018-06-27 (×2): qty 1

## 2018-06-27 NOTE — Progress Notes (Signed)
PT Cancellation Note  Patient Details Name: Sabrina Burke MRN: [id number] DOB: 05-19-1927   Cancelled Treatment:    Reason Eval/Treat Not Completed: PT screened, no needs identified, will sign off;Other (comment). Per CSW note this date: "The CSW contacted [facility] for information. The patient is baseline WC bound but self-propels, self-feeds, needs help with transfers, and has assistance with all other ADLs/IADLs."   Baseline funcitonal status includes assistance with mobility and 24/7 supervision, pt is non AMB. Plan is for return to memory care at DC. Pt will have required level of care at DC. Further PT needs can be established as an outpatient after return to facility.   4:04 PM, 06/27/18 Etta Grandchild, PT, DPT Physical Therapist - [facility]  [phone] ([place])   Faith C 06/27/2018, 4:02 PM

## 2018-06-27 NOTE — Progress Notes (Signed)
PHARMACIST - PHYSICIAN COMMUNICATION  CONCERNING: IV to Oral Route Change Policy  RECOMMENDATION: This patient is receiving thiamine and folic acid by the intravenous route.  Based on criteria approved by the Pharmacy and Therapeutics Committee, the intravenous medication(s) is/are being converted to the equivalent oral dose form(s).   DESCRIPTION: These criteria include:  The patient is eating (either orally or via tube) and/or has been taking other orally administered medications for a least 24 hours  The patient has no evidence of active gastrointestinal bleeding or impaired GI absorption (gastrectomy, short bowel, patient on TNA or NPO).  If you have questions about this conversion, please contact the Pharmacy Department   []   [phone] )  [place], [facility] 06/27/2018 9:20 AM

## 2018-06-27 NOTE — Progress Notes (Signed)
Pharmacy Antibiotic Note  Sabrina Burke is a 85 y.o. female admitted on 06/26/2018 with Sepsis/ UTI.  Patient is a resident of [place]. Pharmacy has been consulted for Cefepime dosing.   Plan: Patient is afebrile and has normalized WBC. Culture results pending. Will continue cefepime 1g IV Q24hr. Recommend narrowing therapy as appropriate.   Height: 5\' 8"  (172.7 cm) Weight: 135 lb 4.8 oz (61.4 kg) IBW/kg (Calculated) : 63.9  Temp (24hrs), Avg:98.7 F (37.1 C), Min:98.5 F (36.9 C), Max:98.9 F (37.2 C)  Recent Labs  Lab 06/26/18 0058 06/26/18 0316 06/26/18 0607 06/27/18 0607  WBC 11.5*  --  10.5  --   CREATININE 1.42*  --  1.49* 1.27*  LATICACIDVEN 0.7 1.0  --   --     Estimated Creatinine Clearance: 29.1 mL/min (A) (by C-G formula based on SCr of 1.27 mg/dL (H)).    No Known Allergies  Antimicrobials this admission: Cefepime 6/6 >>  Metronidazole 6/6 x 1 Vancomycin 6/6 x 1   Dose adjustments this admission: 6/7 Cefepime adjusted to 1g IV Q24hr.   Microbiology results: 6/6 BCx: no growth x 1 day  6/6 UCx:  >100k gram negative rods  6/6 MRSA PCR: negative  6/6 COVID: negative   Thank you for allowing pharmacy to be a part of this patient's care.  Simpson,Michael L 06/27/2018 10:29 AM

## 2018-06-27 NOTE — NC FL2 (Signed)
[place] LEVEL OF CARE SCREENING TOOL     IDENTIFICATION  Patient Name: Sabrina Burke Birthdate: [birth year] Sex: female Admission Date (Current Location): 06/26/2018  [place] and [place] Number:  Engineering geologist and Address:  [facility], [address], [place], [place] [address]      Provider Number: [id number]  Attending Physician Name and Address:  Loletha Grayer, MD  Relative Name and Phone Number:  Sabrina Burke (Daughter) [phone]    Current Level of Care: Hospital Recommended Level of Care: Memory Care Prior Approval Number:    Date Approved/Denied:   PASRR Number:    Discharge Plan: Other (Comment)(Memory Care Unit)    Current Diagnoses: Patient Active Problem List   Diagnosis Date Noted  . Sepsis ([place]) 06/26/2018    Orientation RESPIRATION BLADDER Height & Weight     Self, Place  Normal Incontinent Weight: 135 lb 4.8 oz (61.4 kg) Height:  5\' 8"  (172.7 cm)  BEHAVIORAL SYMPTOMS/MOOD NEUROLOGICAL BOWEL NUTRITION STATUS      Incontinent Diet(Heart healthy)  AMBULATORY STATUS COMMUNICATION OF NEEDS Skin   Total Care Verbally Normal                       Personal Care Assistance Level of Assistance  Bathing, Feeding, Dressing Bathing Assistance: Maximum assistance Feeding assistance: Independent Dressing Assistance: Maximum assistance     Functional Limitations Info  Sight, Hearing, Speech Sight Info: Adequate Hearing Info: Adequate Speech Info: Adequate    SPECIAL CARE FACTORS FREQUENCY                       Contractures Contractures Info: Not present    Additional Factors Info  Code Status, Allergies, Psychotropic Code Status Info: DNR Allergies Info: No Known allergies Psychotropic Info: Ativan         Current Medications (06/27/2018):  This is the current hospital active medication list Current Facility-Administered Medications  Medication Dose Route Frequency Provider Last  Rate Last Dose  . acetaminophen (TYLENOL) tablet 650 mg  650 mg Oral Q6H PRN Seals, Theo Dills, NP       Or  . acetaminophen (TYLENOL) suppository 650 mg  650 mg Rectal Q6H PRN Seals, Theo Dills, NP      . alum & mag hydroxide-simeth (MAALOX/MYLANTA) 200-200-20 MG/5ML suspension 30 mL  30 mL Oral PRN Seals, Levada Dy H, NP      . amLODipine (NORVASC) tablet 5 mg  5 mg Oral Daily Seals, Angela H, NP   5 mg at 06/27/18 0929  . atenolol (TENORMIN) tablet 25 mg  25 mg Oral Daily Seals, Theo Dills, NP   25 mg at 06/27/18 9798  . ceFEPIme (MAXIPIME) 1 g in sodium chloride 0.9 % 100 mL IVPB  1 g Intravenous Q24H Charlett Nose, RPH      . cholecalciferol (VITAMIN D) tablet 2,000 Units  2,000 Units Oral Daily Mayer Camel, NP   2,000 Units at 06/27/18 9211  . docusate sodium (COLACE) capsule 100 mg  100 mg Oral QHS Seals, Angela H, NP   100 mg at 06/26/18 2259  . enoxaparin (LOVENOX) injection 30 mg  30 mg Subcutaneous Q24H Seals, Angela H, NP   30 mg at 06/26/18 2259  . folic acid (FOLVITE) tablet 1 mg  1 mg Oral Daily Charlett Nose, RPH   1 mg at 06/27/18 9417  . ipratropium-albuterol (DUONEB) 0.5-2.5 (3) MG/3ML nebulizer solution 3 mL  3 mL  Nebulization Q6H PRN Alford HighlandWieting, Richard, MD      . levothyroxine (SYNTHROID) tablet 88 mcg  88 mcg Oral Q0600 Pearletha AlfredSeals, Angela H, NP   88 mcg at 06/27/18 0615  . LORazepam (ATIVAN) tablet 0.25 mg  0.25 mg Oral BID Janeann MerlSeals, Angela H, NP   0.25 mg at 06/27/18 0929  . multivitamin with minerals tablet 1 tablet  1 tablet Oral Daily Seals, Milas KocherAngela H, NP   1 tablet at 06/27/18 [id number]  . ondansetron (ZOFRAN) tablet 4 mg  4 mg Oral Q6H PRN Seals, Milas KocherAngela H, NP       Or  . ondansetron (ZOFRAN) injection 4 mg  4 mg Intravenous Q6H PRN Seals, Marylene LandAngela H, NP      . polyethylene glycol (MIRALAX / GLYCOLAX) packet 17 g  17 g Oral Daily PRN Seals, Angela H, NP      . sodium chloride flush (NS) 0.9 % injection 3 mL  3 mL Intravenous Q12H Seals, Angela H, NP   3 mL at 06/27/18 0929  .  thiamine (VITAMIN B-1) tablet 100 mg  100 mg Oral Daily Bertram SavinSimpson, Michael L, RPH   100 mg at 06/27/18 [id number]     Discharge Medications: Please see discharge summary for a list of discharge medications.  Relevant Imaging Results:  Relevant Lab Results:   Additional Information SS#[id number]  Judi CongKaren M Rami Budhu, LCSW

## 2018-06-27 NOTE — Progress Notes (Signed)
Initial Nutrition Assessment  RD working remotely.  DOCUMENTATION CODES:   Not applicable  INTERVENTION:  Recommend liberalizing diet to regular.  Provide Ensure Enlive po BID, each supplement provides 350 kcal and 20 grams of protein.  Continue daily MVI.  NUTRITION DIAGNOSIS:   Increased nutrient needs related to acute illness(sepsis, acute cystitis) as evidenced by estimated needs.  GOAL:   Patient will meet greater than or equal to 90% of their needs  MONITOR:   PO intake, Supplement acceptance, Labs, Weight trends, I & O's  REASON FOR ASSESSMENT:   Malnutrition Screening Tool    ASSESSMENT:   85 year old female with PMHx of dementia, HTN, hypothyroidism admitted with clinical sepsis with acute cystitis, AKI.   Patient unable to provide history at this time. Per chart she is from [facility] unit and can usually self-feed. PO intake variable per chart. She ate 100% of her breakfast yesterday, 0% of lunch yesterday, and 50% of dinner last night. Weight fluctuates in chart. Currently 61.4 kg (135.3 lbs). Unable to determine if patient has malnutrition without nutrition/weight history or NFPE.  Medications reviewed and include: vitamin D 2000 units daily, Colace 563 mg daily, folic acid 1 mg daily, levothyroxine, MVI daily, thiamine 10 mg daily, cefepime.  Labs reviewed: BUN 34, Creatinine 1.27.  NUTRITION - FOCUSED PHYSICAL EXAM:  Unable to complete at this time.  Diet Order:   Diet Order            Diet Heart Room service appropriate? Yes; Fluid consistency: Thin  Diet effective now             EDUCATION NEEDS:   No education needs have been identified at this time  Skin:  Skin Assessment: Reviewed RN Assessment(ecchymosis)  Last BM:  Unknown/PTA  Height:   Ht Readings from Last 1 Encounters:  06/26/18 5\' 8"  (1.727 m)   Weight:   Wt Readings from Last 1 Encounters:  06/26/18 61.4 kg   Ideal Body Weight:  63.6 kg  BMI:  Body  mass index is 20.57 kg/m.  Estimated Nutritional Needs:   Kcal:  1400-1600  Protein:  70-80 grams  Fluid:  1.4-1.6 L/day  Willey Blade, MS, RD, LDN Office: [phone] Pager: [phone] After Hours/Weekend Pager: [phone]

## 2018-06-27 NOTE — TOC Initial Note (Addendum)
Transition of Care [facility](TOC) - Initial/Assessment Note    Patient Details  Name: Barbaraann FasterLavada Lewter MRN: [id number] Date of Birth: 10/26/1927  Transition of Care [facility](TOC) CM/SW Contact:    Judi CongKaren M Bradee Common, LCSW Phone Number: 06/27/2018, 10:40 AM  Clinical Narrative:                 The CSW attempted to contact the patient's daughter and left a HIPPA compliant VM. The CSW contacted [facility] for information. The patient is baseline WC bound but self-propels, self-feeds, needs help with transfers, and has assistance with all other ADLs/IADLs. The facility will need to assess prior to return; however, the patient will most likely be able to return. The CSW provided the med tech from [facility] with a general update on the patient at the med tech's request.  Currently, the patient seems to have a UTI with sepsis, and the team is waiting for cultures to fine-tune antibiotics. The patient may benefit from palliative care due to age and comorbid disease to begin consideration of long term GOC.  UPDATE: The social worker was able to reach the patient's daughter who is the HCPOA. The patient's daughter is in agreement with return to the [facility] and confirmed that her mother is non-ambulatory and well-cared for at the [facility].  [facility] to assist with discharge planning.  Expected Discharge Plan: Memory Care Barriers to Discharge: Continued Medical Work up   Patient Goals and CMS Choice Patient states their goals for this hospitalization and ongoing recovery are:: Patient not alert and family not available   Choice offered to / list presented to : NA  Expected Discharge Plan and Services Expected Discharge Plan: Memory Care In-house Referral: Hospice / Palliative Care(Palliative care) Discharge Planning Services: NA Post Acute Care Choice: NA Living arrangements for the past 2 months: Assisted Living Facility                 DME Arranged: N/A DME Agency: NA        HH Arranged: NA HH Agency: NA        Prior Living Arrangements/Services Living arrangements for the past 2 months: Assisted Living Facility Lives with:: Facility Resident Patient language and need for interpreter reviewed:: No Do you feel safe going back to the place where you live?: Yes      Need for Family Participation in Patient Care: Yes (Comment)(Patient has dementia and is intermittently confused) Care giver support system in place?: Yes (comment) Current home services: DME Criminal Activity/Legal Involvement Pertinent to Current Situation/Hospitalization: No - Comment as needed  Activities of Daily Living Home Assistive Devices/Equipment: None ADL Screening (condition at time of admission) Patient's cognitive ability adequate to safely complete daily activities?: No Is the patient deaf or have difficulty hearing?: No Does the patient have difficulty seeing, even when wearing glasses/contacts?: No Does the patient have difficulty concentrating, remembering, or making decisions?: Yes Patient able to express need for assistance with ADLs?: No Does the patient have difficulty dressing or bathing?: Yes Independently performs ADLs?: No Communication: Needs assistance Is this a change from baseline?: Pre-admission baseline Dressing (OT): Needs assistance Is this a change from baseline?: Pre-admission baseline Grooming: Needs assistance Is this a change from baseline?: Pre-admission baseline Feeding: Needs assistance Is this a change from baseline?: Pre-admission baseline Bathing: Needs assistance Is this a change from baseline?: Pre-admission baseline Toileting: Needs assistance Is this a change from baseline?: Pre-admission baseline In/Out Bed: Needs assistance Is  this a change from baseline?: Pre-admission baseline Does the patient have difficulty walking or climbing stairs?: Yes Weakness of Legs: Both Weakness of Arms/Hands: Both  Permission  Sought/Granted Permission sought to share information with : Facility Sport and exercise psychologist, Family Supports                Emotional Assessment Appearance:: Appears stated age Attitude/Demeanor/Rapport: Lethargic Affect (typically observed): Stable Orientation: : Oriented to Self, Oriented to Place Alcohol / Substance Use: Never Used Psych Involvement: No (comment)  Admission diagnosis:  Acute cystitis without hematuria [N30.00] Sepsis, due to unspecified organism, unspecified whether acute organ dysfunction present [facility]) [A41.9] Patient Active Problem List   Diagnosis Date Noted  . Sepsis ([place]) 06/26/2018   PCP:  System, Pcp Not In Pharmacy:  No Pharmacies Listed    Social Determinants of Health (SDOH) Interventions    Readmission Risk Interventions No flowsheet data found.

## 2018-06-27 NOTE — Progress Notes (Signed)
Patient ID: Sabrina Burke, female   DOB: 06/15/1927, 85 y.o.   MRN: [id number]  [facility] PROGRESS NOTE  Sabrina Burke FAO:[id number] DOB: 11/05/1927 DOA: 06/26/2018 PCP: System, Pcp Not In  HPI/Subjective: Patient feels okay.  Offers no complaints.  Answers a few yes or no questions.  Does not elaborate much.  Objective: Vitals:   06/26/18 2023 06/27/18 0447  BP: (!) 186/68 (!) 159/89  Pulse: 60 70  Resp: 16 17  Temp: 98.7 F (37.1 C) 98.5 F (36.9 C)  SpO2: 95% 96%    Filed Weights   06/26/18 0044 06/26/18 0554  Weight: 59.9 kg 61.4 kg    ROS: Review of Systems  Unable to perform ROS: Dementia  Respiratory: Negative for shortness of breath.   Cardiovascular: Negative for chest pain.  Gastrointestinal: Negative for abdominal pain, diarrhea, nausea and vomiting.   Exam: Physical Exam  HENT:  Nose: No mucosal edema.  Mouth/Throat: No oropharyngeal exudate or posterior oropharyngeal edema.  Eyes: Pupils are equal, round, and reactive to light. Conjunctivae, EOM and lids are normal.  Neck: No JVD present. Carotid bruit is not present. No edema present. No thyroid mass and no thyromegaly present.  Cardiovascular: S1 normal and S2 normal. Exam reveals no gallop.  No murmur heard. Pulses:      Dorsalis pedis pulses are 2+ on the right side and 2+ on the left side.  Respiratory: No respiratory distress. She has decreased breath sounds in the right lower field and the left lower field. She has no wheezes. She has no rhonchi. She has no rales.  GI: Soft. Bowel sounds are normal. There is no abdominal tenderness.  Musculoskeletal:     Right ankle: She exhibits no swelling.     Left ankle: She exhibits no swelling.  Lymphadenopathy:    She has no cervical adenopathy.  Neurological: She is alert.  Skin: Skin is warm. No rash noted. Nails show no clubbing.  Psychiatric: She has a normal mood and affect.      Data Reviewed: Basic Metabolic Panel: Recent Labs  Lab  06/26/18 0058 06/26/18 0607 06/27/18 0607  NA 137 141 141  K 4.1 4.9 3.6  CL 106 107 110  CO2 24 27 25   GLUCOSE 140* 129* 98  BUN 40* 37* 34*  CREATININE 1.42* 1.49* 1.27*  CALCIUM 8.3* 8.6* 8.3*   Liver Function Tests: Recent Labs  Lab 06/26/18 0058  AST 18  ALT 15  ALKPHOS 55  BILITOT 0.5  PROT 6.4*  ALBUMIN 2.5*   CBC: Recent Labs  Lab 06/26/18 0058 06/26/18 0607  WBC 11.5* 10.5  NEUTROABS 8.7*  --   HGB 9.4* 10.0*  HCT 28.5* 31.2*  MCV 99.7 100.3*  PLT 240 241     Recent Results (from the past 240 hour(s))  Blood Culture (routine x 2)     Status: None (Preliminary result)   Collection Time: 06/26/18 12:58 AM  Result Value Ref Range Status   Specimen Description BLOOD BLOOD LEFT HAND  Final   Special Requests   Final    BOTTLES DRAWN AEROBIC AND ANAEROBIC Blood Culture adequate volume   Culture   Final    NO GROWTH 1 DAY Performed at [facility], [address] [place], [place] [address]    Report Status PENDING  Incomplete  Blood Culture (routine x 2)     Status: None (Preliminary result)   Collection Time: 06/26/18 12:58 AM  Result Value Ref Range Status   Specimen Description BLOOD  BLOOD RIGHT HAND  Final   Special Requests   Final    BOTTLES DRAWN AEROBIC AND ANAEROBIC Blood Culture adequate volume   Culture   Final    NO GROWTH 1 DAY Performed at [facility], [address] [place], [place] [address]    Report Status PENDING  Incomplete  Urine culture     Status: Abnormal (Preliminary result)   Collection Time: 06/26/18 12:58 AM  Result Value Ref Range Status   Specimen Description   Final    URINE, CLEAN CATCH Performed at [facility], [address], [place], [place] [address]    Special Requests   Final    NONE Performed at [facility], [address] [place], [place] [address]    Culture >=100,000 COLONIES/mL KLEBSIELLA PNEUMONIAE (A)  Final   Report Status PENDING  Incomplete  SARS  Coronavirus 2 (CEPHEID- Performed in [facility] hospital lab), Hosp Order     Status: None   Collection Time: 06/26/18 12:58 AM  Result Value Ref Range Status   SARS Coronavirus 2 NEGATIVE NEGATIVE Final    Comment: (NOTE) If result is NEGATIVE SARS-CoV-2 target nucleic acids are NOT DETECTED. The SARS-CoV-2 RNA is generally detectable in upper and lower  respiratory specimens during the acute phase of infection. The lowest  concentration of SARS-CoV-2 viral copies this assay can detect is 250  copies / mL. A negative result does not preclude SARS-CoV-2 infection  and should not be used as the sole basis for treatment or other  patient management decisions.  A negative result may occur with  improper specimen collection / handling, submission of specimen other  than nasopharyngeal swab, presence of viral mutation(s) within the  areas targeted by this assay, and inadequate number of viral copies  (<250 copies / mL). A negative result must be combined with clinical  observations, patient history, and epidemiological information. If result is POSITIVE SARS-CoV-2 target nucleic acids are DETECTED. The SARS-CoV-2 RNA is generally detectable in upper and lower  respiratory specimens dur ing the acute phase of infection.  Positive  results are indicative of active infection with SARS-CoV-2.  Clinical  correlation with patient history and other diagnostic information is  necessary to determine patient infection status.  Positive results do  not rule out bacterial infection or co-infection with other viruses. If result is PRESUMPTIVE POSTIVE SARS-CoV-2 nucleic acids MAY BE PRESENT.   A presumptive positive result was obtained on the submitted specimen  and confirmed on repeat testing.  While 2019 novel coronavirus  (SARS-CoV-2) nucleic acids may be present in the submitted sample  additional confirmatory testing may be necessary for epidemiological  and / or clinical management purposes  to  differentiate between  SARS-CoV-2 and other Sarbecovirus currently known to infect humans.  If clinically indicated additional testing with an alternate test  methodology [phone]) is advised. The SARS-CoV-2 RNA is generally  detectable in upper and lower respiratory sp ecimens during the acute  phase of infection. The expected result is Negative. Fact Sheet for Patients:  StrictlyIdeas.no Fact Sheet for Healthcare Providers: BankingDealers.co.za This test is not yet approved or cleared by the [place] FDA and has been authorized for detection and/or diagnosis of SARS-CoV-2 by FDA under an Emergency Use Authorization (EUA).  This EUA will remain in effect (meaning this test can be used) for the duration of the COVID-19 declaration under Section 564(b)(1) of the Act, 21 U.S.C. section 360bbb-3(b)(1), unless the authorization is terminated or revoked sooner.  Performed at [facility], [address] [place], [place] [address]   MRSA PCR Screening     Status: None   Collection Time: 06/26/18  1:54 PM  Result Value Ref Range Status   MRSA by PCR NEGATIVE NEGATIVE Final    Comment:        The GeneXpert MRSA Assay (FDA approved for NASAL specimens only), is one component of a comprehensive MRSA colonization surveillance program. It is not intended to diagnose MRSA infection nor to guide or monitor treatment for MRSA infections. Performed at [facility], [address] [place], [place] [address]      Studies: Dg Chest [place]  Result Date: 06/26/2018 CLINICAL DATA:  85 year old female with dyspnea. EXAM: PORTABLE CHEST 1 VIEW COMPARISON:  None. FINDINGS: Evaluation is very limited, almost nondiagnostic due to positioning of the patient. The visualized upper lung is clear. No definite pneumothorax identified. There is atherosclerotic calcification of the aortic arch. Osteopenia with degenerative changes of  the spine. IMPRESSION: Very limited, almost nondiagnostic study. No definite abnormality identified in the visualized upper lungs. Repeat radiograph with better positioning of the patient is recommended Electronically Signed   By: Elgie CollardArash  Radparvar M.D.   On: 06/26/2018 02:15    Scheduled Meds: . amLODipine  5 mg Oral Daily  . atenolol  25 mg Oral Daily  . cholecalciferol  2,000 Units Oral Daily  . docusate sodium  100 mg Oral QHS  . enoxaparin (LOVENOX) injection  30 mg Subcutaneous Q24H  . feeding supplement (ENSURE ENLIVE)  237 mL Oral BID BM  . folic acid  1 mg Oral Daily  . levothyroxine  88 mcg Oral Q0600  . LORazepam  0.25 mg Oral BID  . multivitamin with minerals  1 tablet Oral Daily  . sodium chloride flush  3 mL Intravenous Q12H  . thiamine  100 mg Oral Daily   Continuous Infusions: . ceFEPime (MAXIPIME) IV      Assessment/Plan:  1. Clinical sepsis with acute cystitis.  Patient is on broad-spectrum antibiotic.  Urine culture showing Klebsiella.  Hopefully will have sensitivities tomorrow. 2. Acute cystitis on broad-spectrum antibiotic.  Follow-up cultures 3. Acute kidney injury.  Creatinine improved.  I stopped IV fluids yesterday secondary to wheezing 4. Wheeze.  Improved with nebulizer treatments 5. Hypothyroidism unspecified on levothyroxine 6. Hypertension on no amlodipine and atenolol 7. Dementia  Code Status:     Code Status Orders  (From admission, onward)         Start     Ordered   06/26/18 0626  Do not attempt resuscitation (DNR)  Continuous    Question Answer Comment  In the event of cardiac or respiratory ARREST Do not call a "code blue"   In the event of cardiac or respiratory ARREST Do not perform Intubation, CPR, defibrillation or ACLS   In the event of cardiac or respiratory ARREST Use medication by any route, position, wound care, and other measures to relive pain and suffering. May use oxygen, suction and manual treatment of airway obstruction as  needed for comfort.      06/26/18 0626        Code Status History    Date Active Date Inactive Code Status Order ID Comments User Context   06/26/2018 0412 06/26/2018 0626 Full Code [id number]  Pearletha AlfredSeals, Angela H, NP ED    Advance Directive Documentation     Most Recent Value  Type of Advance Directive  Out of facility DNR (pink MOST  or yellow form)  Pre-existing out of facility DNR order (yellow form or pink MOST form)  Yellow form placed in chart (order not valid for inpatient use)  "MOST" Form in Place?  -     Family Communication: Left message for daughter today.  Spoke with her on the phone yesterday. Disposition Plan: Likely back to [place] house tomorrow once urine culture is back and if afebrile.  Antibiotics:  Cefepime  Time spent: 27 minutes  Yunis Voorheis [employer]  [facility]

## 2018-06-28 LAB — CREATININE, SERUM
Creatinine, Ser: 1.15 mg/dL — ABNORMAL HIGH (ref 0.44–1.00)
GFR calc Af Amer: 49 mL/min — ABNORMAL LOW (ref >60)
GFR calc non Af Amer: 42 mL/min — ABNORMAL LOW (ref >60)

## 2018-06-28 LAB — URINE CULTURE: Culture: >=100000 — AB

## 2018-06-28 MED ORDER — AMLODIPINE BESYLATE 10 MG PO TABS
10.0000 mg | ORAL_TABLET | Freq: Every day | ORAL | Status: DC
  Administered 2018-06-28: 09:00:00 10 mg via ORAL
  Filled 2018-06-28: qty 1

## 2018-06-28 MED ORDER — LORAZEPAM 0.5 MG PO TABS: 0.2500 mg | ORAL_TABLET | Freq: Two times a day (BID) | ORAL | 0 refills | Status: DC

## 2018-06-28 MED ORDER — ASPIRIN EC 81 MG PO TBEC
81.0000 mg | DELAYED_RELEASE_TABLET | Freq: Every day | ORAL | Status: DC
  Administered 2018-06-28: 81 mg via ORAL
  Filled 2018-06-28: qty 1

## 2018-06-28 MED ORDER — ENOXAPARIN SODIUM 40 MG/0.4ML ~~LOC~~ SOLN: 40.0000 mg | SUBCUTANEOUS | Status: DC

## 2018-06-28 MED ORDER — SODIUM CHLORIDE 0.9 % IV SOLN
1.0000 g | INTRAVENOUS | Status: DC
  Administered 2018-06-28: 1 g via INTRAVENOUS
  Filled 2018-06-28 (×2): qty 10
  Filled 2018-06-28: qty 1

## 2018-06-28 MED ORDER — ATENOLOL 25 MG PO TABS
12.5000 mg | ORAL_TABLET | Freq: Every day | ORAL | Status: DC
  Administered 2018-06-28: 12.5 mg via ORAL
  Filled 2018-06-28: qty 0.5

## 2018-06-28 MED ORDER — AMLODIPINE BESYLATE 10 MG PO TABS: 10.0000 mg | ORAL_TABLET | Freq: Every day | ORAL | 0 refills | Status: DC

## 2018-06-28 MED ORDER — CEPHALEXIN 500 MG PO CAPS: 500.0000 mg | ORAL_CAPSULE | Freq: Two times a day (BID) | ORAL | 0 refills | Status: AC

## 2018-06-28 MED ORDER — ATENOLOL 25 MG PO TABS: 12.5000 mg | ORAL_TABLET | Freq: Every day | ORAL | 0 refills | Status: DC

## 2018-06-28 MED ORDER — ENSURE ENLIVE PO LIQD: 237.0000 mL | Freq: Two times a day (BID) | ORAL | 0 refills | Status: AC

## 2018-06-28 MED ORDER — ASPIRIN 81 MG PO TBEC: 81.0000 mg | DELAYED_RELEASE_TABLET | Freq: Every day | ORAL | 0 refills | Status: AC

## 2018-06-28 NOTE — Plan of Care (Signed)
  Problem: Fluid Volume: Goal: Hemodynamic stability will improve Outcome: Progressing   Problem: Clinical Measurements: Goal: Diagnostic test results will improve Outcome: Progressing Goal: Signs and symptoms of infection will decrease Outcome: Progressing   Problem: Respiratory: Goal: Ability to maintain adequate ventilation will improve Outcome: Progressing   Problem: Safety: Goal: Ability to remain free from injury will improve Outcome: Progressing   Problem: Skin Integrity: Goal: Risk for impaired skin integrity will decrease Outcome: Progressing   Problem: Health Behavior/Discharge Planning: Goal: Ability to manage health-related needs will improve Outcome: Progressing

## 2018-06-28 NOTE — Discharge Summary (Signed)
[employer] - [place] at [facility]   PATIENT NAME: Barbaraann FasterLavada Koudelka    MR#:  [id number]  DATE OF BIRTH:  02/17/1927  DATE OF ADMISSION:  06/26/2018 ADMITTING PHYSICIAN: Hannah BeatJan A Mansy, MD  DATE OF DISCHARGE: No discharge date for patient encounter.  PRIMARY CARE PHYSICIAN: System, Pcp Not In    ADMISSION DIAGNOSIS:  Acute cystitis without hematuria [N30.00] Sepsis, due to unspecified organism, unspecified whether acute organ dysfunction present (HCC) [A41.9]  DISCHARGE DIAGNOSIS:  Active Problems:   Sepsis (HCC)   SECONDARY DIAGNOSIS:   Past Medical History:  Diagnosis Date  . Hypertension     HOSPITAL COURSE:   1.  Clinical sepsis with acute cystitis.  Urine culture growing Klebsiella.  Patient received Maxipime initially and switched over to Rocephin.  We will give Keflex for a few more days upon discharge. 2.  Acute cystitis.  Given antibiotics while here and will give 3 more days of Keflex upon going home starting tomorrow 3.  Acute kidney injury.  Creatinine improved with IV fluids. 4.  Wheeze.  This improved with nebulizer treatments.  No further wheeze.  Nebulizer treatments discontinued 5.  Paroxysmal atrial fibrillation with episode of bradycardia overnight while sleeping.  I will decrease the atenolol dose to 12.5 mg daily.  Spoke with daughter on the phone and the patient would like a natural death and does not want the patient on a major blood thinner.  Will start aspirin 81 mg daily to reduce the risk of stroke. 6.  Hypertension.  Increase dose of amlodipine and lower the dose of atenolol. 7.  Vascular dementia.  Patient able to feed herself and answer questions.  Not ambulatory anymore.  Wheelchair-bound. 8.  Patient is a DNR  DISCHARGE CONDITIONS:   Fair  CONSULTS OBTAINED:  None  DRUG ALLERGIES:  No Known Allergies  DISCHARGE MEDICATIONS:   Allergies as of 06/28/2018   No Known Allergies     Medication List    TAKE these medications    acetaminophen 325 MG tablet Commonly known as:  TYLENOL Take 650 mg by mouth every 6 (six) hours as needed for mild pain or fever.   alum & mag hydroxide-simeth 200-200-20 MG/5ML suspension Commonly known as:  MAALOX/MYLANTA Take 30 mLs by mouth as needed for indigestion or heartburn.   amLODipine 10 MG tablet Commonly known as:  NORVASC Take 1 tablet (10 mg total) by mouth daily. What changed:    medication strength  how much to take   aspirin 81 MG EC tablet Take 1 tablet (81 mg total) by mouth daily.   atenolol 25 MG tablet Commonly known as:  TENORMIN Take 0.5 tablets (12.5 mg total) by mouth daily. What changed:  how much to take   bisacodyl 5 MG EC tablet Commonly known as:  DULCOLAX Take 5 mg by mouth daily as needed for moderate constipation.   cephALEXin 500 MG capsule Commonly known as:  KEFLEX Take 1 capsule (500 mg total) by mouth 2 (two) times daily for 10 days. Start taking on:  June 29, 2018   cholecalciferol 25 MCG (1000 UT) tablet Commonly known as:  VITAMIN D Take 2,000 Units by mouth daily.   DAILY VITE PO Take 1 tablet by mouth daily.   docusate sodium 100 MG capsule Commonly known as:  COLACE Take 100 mg by mouth at bedtime.   feeding supplement (ENSURE ENLIVE) Liqd Take 237 mLs by mouth 2 (two) times daily between meals.   guaiFENesin 100 MG/5ML liquid  Commonly known as:  ROBITUSSIN Take 200 mg by mouth 3 (three) times daily as needed for cough.   levothyroxine 88 MCG tablet Commonly known as:  SYNTHROID Take 88 mcg by mouth daily before breakfast.   loperamide 2 MG capsule Commonly known as:  IMODIUM Take 2 mg by mouth as needed for diarrhea or loose stools.   LORazepam 0.5 MG tablet Commonly known as:  ATIVAN Take 0.5 tablets (0.25 mg total) by mouth 2 (two) times daily. Takes at 1400 and 2100 What changed:  Another medication with the same name was removed. Continue taking this medication, and follow the directions you see  here.   magnesium hydroxide 400 MG/5ML suspension Commonly known as:  MILK OF MAGNESIA Take 30 mLs by mouth daily as needed for mild constipation.   neomycin-bacitracin-polymyxin ointment Commonly known as:  NEOSPORIN Apply 1 application topically as needed for wound care.        DISCHARGE INSTRUCTIONS:   Follow-up with Dr. at facility  If you experience worsening of your admission symptoms, develop shortness of breath, life threatening emergency, suicidal or homicidal thoughts you must seek medical attention immediately by calling 911 or calling your MD immediately  if symptoms less severe.  You Must read complete instructions/literature along with all the possible adverse reactions/side effects for all the Medicines you take and that have been prescribed to you. Take any new Medicines after you have completely understood and accept all the possible adverse reactions/side effects.   Please note  You were cared for by a hospitalist during your hospital stay. If you have any questions about your discharge medications or the care you received while you were in the hospital after you are discharged, you can call the unit and asked to speak with the hospitalist on call if the hospitalist that took care of you is not available. Once you are discharged, your primary care physician will handle any further medical issues. Please note that NO REFILLS for any discharge medications will be authorized once you are discharged, as it is imperative that you return to your primary care physician (or establish a relationship with a primary care physician if you do not have one) for your aftercare needs so that they can reassess your need for medications and monitor your lab values.    Today   CHIEF COMPLAINT:   Chief Complaint  Patient presents with  . Fever    HISTORY OF PRESENT ILLNESS:  Sanari Offner  is a 85 y.o. female presented with fever and found to have a urinary tract  infection   VITAL SIGNS:  Blood pressure (!) 172/63, pulse 76, temperature 97.9 F (36.6 C), resp. rate 20, height 5\' 8"  (7.124 m), weight 61.4 kg, SpO2 96 %.   PHYSICAL EXAMINATION:  GENERAL:  85 y.o.-year-old patient lying in the bed with no acute distress.  EYES: Pupils equal, round, reactive to light and accommodation. No scleral icterus. Extraocular muscles intact.  HEENT: Head atraumatic, normocephalic. Oropharynx and nasopharynx clear.  NECK:  Supple, no jugular venous distention. No thyroid enlargement, no tenderness.  LUNGS: Normal breath sounds bilaterally, no wheezing, rales,rhonchi or crepitation. No use of accessory muscles of respiration.  CARDIOVASCULAR: S1, S2 normal. No murmurs, rubs, or gallops.  ABDOMEN: Soft, non-tender, non-distended. Bowel sounds present. No organomegaly or mass.  EXTREMITIES: Trace pedal edema, no cyanosis, or clubbing.  NEUROLOGIC: Cranial nerves II through XII are intact. Muscle strength 5/5 in all extremities. Sensation intact. Gait not checked.  PSYCHIATRIC: The patient is  alert and answering questions and feeding herself.  SKIN: Skin tear right arm.  Bruising left arm.Marland Kitchen.   DATA REVIEW:   CBC Recent Labs  Lab 06/26/18 0607  WBC 10.5  HGB 10.0*  HCT 31.2*  PLT 241    Chemistries  Recent Labs  Lab 06/26/18 0058  06/27/18 0607 06/28/18 0435  NA 137   < > 141  --   K 4.1   < > 3.6  --   CL 106   < > 110  --   CO2 24   < > 25  --   GLUCOSE 140*   < > 98  --   BUN 40*   < > 34*  --   CREATININE 1.42*   < > 1.27* 1.15*  CALCIUM 8.3*   < > 8.3*  --   AST 18  --   --   --   ALT 15  --   --   --   ALKPHOS 55  --   --   --   BILITOT 0.5  --   --   --    < > = values in this interval not displayed.     Microbiology Results  Results for orders placed or performed during the hospital encounter of 06/26/18  Blood Culture (routine x 2)     Status: None (Preliminary result)   Collection Time: 06/26/18 12:58 AM  Result Value Ref  Range Status   Specimen Description BLOOD BLOOD LEFT HAND  Final   Special Requests   Final    BOTTLES DRAWN AEROBIC AND ANAEROBIC Blood Culture adequate volume   Culture   Final    NO GROWTH 2 DAYS Performed at [facility], [address] [place], [place] [address]    Report Status PENDING  Incomplete  Blood Culture (routine x 2)     Status: None (Preliminary result)   Collection Time: 06/26/18 12:58 AM  Result Value Ref Range Status   Specimen Description BLOOD BLOOD RIGHT HAND  Final   Special Requests   Final    BOTTLES DRAWN AEROBIC AND ANAEROBIC Blood Culture adequate volume   Culture   Final    NO GROWTH 2 DAYS Performed at [facility], [address] [place], [place] [address]    Report Status PENDING  Incomplete  Urine culture     Status: Abnormal   Collection Time: 06/26/18 12:58 AM  Result Value Ref Range Status   Specimen Description   Final    URINE, CLEAN CATCH Performed at [facility], [address] [place], [place] [address]    Special Requests   Final    NONE Performed at [facility], [address] [place], [place] [address]    Culture >=100,000 COLONIES/mL KLEBSIELLA PNEUMONIAE (A)  Final   Report Status 06/28/2018 FINAL  Final   Organism ID, Bacteria KLEBSIELLA PNEUMONIAE (A)  Final      Susceptibility   Klebsiella pneumoniae - MIC*    AMPICILLIN RESISTANT Resistant     CEFAZOLIN <=4 SENSITIVE Sensitive     CEFTRIAXONE <=1 SENSITIVE Sensitive     CIPROFLOXACIN <=0.25 SENSITIVE Sensitive     GENTAMICIN <=1 SENSITIVE Sensitive     IMIPENEM <=0.25 SENSITIVE Sensitive     NITROFURANTOIN 64 INTERMEDIATE Intermediate     TRIMETH/SULFA <=20 SENSITIVE Sensitive     AMPICILLIN/SULBACTAM 4 SENSITIVE Sensitive     PIP/TAZO <=4 SENSITIVE Sensitive     Extended ESBL NEGATIVE Sensitive     * >=  100,000 COLONIES/mL KLEBSIELLA PNEUMONIAE  SARS Coronavirus 2 (CEPHEID- Performed in [facility] hospital lab), Hosp Order      Status: None   Collection Time: 06/26/18 12:58 AM  Result Value Ref Range Status   SARS Coronavirus 2 NEGATIVE NEGATIVE Final    Comment: (NOTE) If result is NEGATIVE SARS-CoV-2 target nucleic acids are NOT DETECTED. The SARS-CoV-2 RNA is generally detectable in upper and lower  respiratory specimens during the acute phase of infection. The lowest  concentration of SARS-CoV-2 viral copies this assay can detect is 250  copies / mL. A negative result does not preclude SARS-CoV-2 infection  and should not be used as the sole basis for treatment or other  patient management decisions.  A negative result may occur with  improper specimen collection / handling, submission of specimen other  than nasopharyngeal swab, presence of viral mutation(s) within the  areas targeted by this assay, and inadequate number of viral copies  (<250 copies / mL). A negative result must be combined with clinical  observations, patient history, and epidemiological information. If result is POSITIVE SARS-CoV-2 target nucleic acids are DETECTED. The SARS-CoV-2 RNA is generally detectable in upper and lower  respiratory specimens dur ing the acute phase of infection.  Positive  results are indicative of active infection with SARS-CoV-2.  Clinical  correlation with patient history and other diagnostic information is  necessary to determine patient infection status.  Positive results do  not rule out bacterial infection or co-infection with other viruses. If result is PRESUMPTIVE POSTIVE SARS-CoV-2 nucleic acids MAY BE PRESENT.   A presumptive positive result was obtained on the submitted specimen  and confirmed on repeat testing.  While 2019 novel coronavirus  (SARS-CoV-2) nucleic acids may be present in the submitted sample  additional confirmatory testing may be necessary for epidemiological  and / or clinical management purposes  to differentiate between  SARS-CoV-2 and other Sarbecovirus currently known to  infect humans.  If clinically indicated additional testing with an alternate test  methodology [phone]) is advised. The SARS-CoV-2 RNA is generally  detectable in upper and lower respiratory sp ecimens during the acute  phase of infection. The expected result is Negative. Fact Sheet for Patients:  BoilerBrush.com.cy Fact Sheet for Healthcare Providers: https://pope.com/ This test is not yet approved or cleared by the [place] FDA and has been authorized for detection and/or diagnosis of SARS-CoV-2 by FDA under an Emergency Use Authorization (EUA).  This EUA will remain in effect (meaning this test can be used) for the duration of the COVID-19 declaration under Section 564(b)(1) of the Act, 21 U.S.C. section 360bbb-3(b)(1), unless the authorization is terminated or revoked sooner. Performed at [facility], [address] [place], [place] [address]   MRSA PCR Screening     Status: None   Collection Time: 06/26/18  1:54 PM  Result Value Ref Range Status   MRSA by PCR NEGATIVE NEGATIVE Final    Comment:        The GeneXpert MRSA Assay (FDA approved for NASAL specimens only), is one component of a comprehensive MRSA colonization surveillance program. It is not intended to diagnose MRSA infection nor to guide or monitor treatment for MRSA infections. Performed at [facility], [address] [place], [place] [address]       Management plans discussed with the patient, family and they are in agreement.  CODE STATUS:     Code Status Orders  (From admission, onward)  Start     Ordered   06/26/18 0626  Do not attempt resuscitation (DNR)  Continuous    Question Answer Comment  In the event of cardiac or respiratory ARREST Do not call a "code blue"   In the event of cardiac or respiratory ARREST Do not perform Intubation, CPR, defibrillation or ACLS   In the event of cardiac or respiratory  ARREST Use medication by any route, position, wound care, and other measures to relive pain and suffering. May use oxygen, suction and manual treatment of airway obstruction as needed for comfort.      06/26/18 0626        Code Status History    Date Active Date Inactive Code Status Order ID Comments User Context   06/26/2018 0412 06/26/2018 0626 Full Code [id number]  Pearletha AlfredSeals, Angela H, NP ED    Advance Directive Documentation     Most Recent Value  Type of Advance Directive  Out of facility DNR (pink MOST or yellow form)  Pre-existing out of facility DNR order (yellow form or pink MOST form)  Yellow form placed in chart (order not valid for inpatient use)  "MOST" Form in Place?  -      TOTAL TIME TAKING CARE OF THIS PATIENT: 35 minutes.    Alford Highlandichard Kenai Fluegel M.D on 06/28/2018 at 9:15 AM  Between 7am to 6pm - Pager - [phone]  After 6pm go to [email or web address] - password [email or web address]  [employer] Office  [phone]  CC: Primary care physician; System, Pcp Not In

## 2018-06-28 NOTE — Progress Notes (Signed)
PHARMACIST - PHYSICIAN COMMUNICATION  CONCERNING:  Enoxaparin (Lovenox) for DVT Prophylaxis    RECOMMENDATION: Patient was prescribed enoxaprin 30mg  q24 hours for VTE prophylaxis.   Filed Weights   06/26/18 0044 06/26/18 0554  Weight: 132 lb (59.9 kg) 135 lb 4.8 oz (61.4 kg)    Body mass index is 20.57 kg/m.  Estimated Creatinine Clearance: 32.1 mL/min (A) (by C-G formula based on SCr of 1.15 mg/dL (H)).  Patient is candidate for enoxaparin 40mg  every 24 hours based on CrCl >21ml/min AND Weight > 45kg for female   DESCRIPTION: Pharmacy has adjusted enoxaparin dose per [facility] policy.  Patient is now receiving enoxaparin 40mg  every 24 hours.    Lu Duffel, PharmD, BCPS Clinical Pharmacist 06/28/2018 10:19 AM

## 2018-06-28 NOTE — Progress Notes (Signed)
Pt to be discharged back to [facility] care unit. Report called to [facility]  There. Pt will be transported  By ems. They  Will be called.  No resp distress.  Pt in transfer pack and ready to go. Sl d/cd.

## 2018-06-28 NOTE — TOC Transition Note (Signed)
Transition of Care [facility]) - CM/SW Discharge Note   Patient Details  Name: Sabrina Burke MRN: [id number] Date of Birth: 12/25/1927  [facility] CM/SW Contact:  Annamaria Boots, [place] Phone Number: 06/28/2018, 1:23 PM   Clinical Narrative:  Patient is medically ready for discharge today back to [facility] care. CSW notified patient's daughter Sabrina Burke of discharge today. Daughter is in agreement. Patient will be transported by EMS. RN to call report and call for transport.       Final next level of care: Memory Care Barriers to Discharge: No Barriers Identified   Patient Goals and CMS Choice Patient states their goals for this hospitalization and ongoing recovery are:: Patient not alert and family not available CMS Medicare.gov Compare Post Acute Care list provided to:: Patient Represenative (must comment)(daughter ) Choice offered to / list presented to : Adult Children  Discharge Placement              Patient chooses bed at: ([facility] ) Patient to be transferred to facility by: EMS Name of family member notified: Daughter- Sabrina Burke  Patient and family notified of of transfer: 06/28/18  Discharge Plan and Services In-house Referral: Hospice / Palliative Care(Palliative care) Discharge Planning Services: NA Post Acute Care Choice: NA          DME Arranged: N/A DME Agency: NA       HH Arranged: NA HH Agency: NA        Social Determinants of Health (SDOH) Interventions     Readmission Risk Interventions No flowsheet data found.

## 2018-06-28 NOTE — Discharge Instructions (Signed)
Urinary Tract Infection, Adult A urinary tract infection (UTI) is an infection of any part of the urinary tract. The urinary tract includes:  The kidneys.  The ureters.  The bladder.  The urethra. These organs make, store, and get rid of pee (urine) in the body. What are the causes? This is caused by germs (bacteria) in your genital area. These germs grow and cause swelling (inflammation) of your urinary tract. What increases the risk? You are more likely to develop this condition if:  You have a small, thin tube (catheter) to drain pee.  You cannot control when you pee or poop (incontinence).  You are female, and: ? You use these methods to prevent pregnancy: ? A medicine that kills sperm (spermicide). ? A device that blocks sperm (diaphragm). ? You have low levels of a female hormone (estrogen). ? You are pregnant.  You have genes that add to your risk.  You are sexually active.  You take antibiotic medicines.  You have trouble peeing because of: ? A prostate that is bigger than normal, if you are female. ? A blockage in the part of your body that drains pee from the bladder (urethra). ? A kidney stone. ? A nerve condition that affects your bladder (neurogenic bladder). ? Not getting enough to drink. ? Not peeing often enough.  You have other conditions, such as: ? Diabetes. ? A weak disease-fighting system (immune system). ? Sickle cell disease. ? Gout. ? Injury of the spine. What are the signs or symptoms? Symptoms of this condition include:  Needing to pee right away (urgently).  Peeing often.  Peeing small amounts often.  Pain or burning when peeing.  Blood in the pee.  Pee that smells bad or not like normal.  Trouble peeing.  Pee that is cloudy.  Fluid coming from the vagina, if you are female.  Pain in the belly or lower back. Other symptoms include:  Throwing up (vomiting).  No urge to eat.  Feeling mixed up (confused).  Being tired  and grouchy (irritable).  A fever.  Watery poop (diarrhea). How is this treated? This condition may be treated with:  Antibiotic medicine.  Other medicines.  Drinking enough water. Follow these instructions at home:  Medicines  Take over-the-counter and prescription medicines only as told by your doctor.  If you were prescribed an antibiotic medicine, take it as told by your doctor. Do not stop taking it even if you start to feel better. General instructions  Make sure you: ? Pee until your bladder is empty. ? Do not hold pee for a long time. ? Empty your bladder after sex. ? Wipe from front to back after pooping if you are a female. Use each tissue one time when you wipe.  Drink enough fluid to keep your pee pale yellow.  Keep all follow-up visits as told by your doctor. This is important. Contact a doctor if:  You do not get better after 1-2 days.  Your symptoms go away and then come back. Get help right away if:  You have very bad back pain.  You have very bad pain in your lower belly.  You have a fever.  You are sick to your stomach (nauseous).  You are throwing up. Summary  A urinary tract infection (UTI) is an infection of any part of the urinary tract.  This condition is caused by germs in your genital area.  There are many risk factors for a UTI. These include having a small, thin   tube to drain pee and not being able to control when you pee or poop.  Treatment includes antibiotic medicines for germs.  Drink enough fluid to keep your pee pale yellow. This information is not intended to replace advice given to you by your health care provider. Make sure you discuss any questions you have with your health care provider. Document Released: 06/25/2007 Document Revised: 07/16/2017 Document Reviewed: 07/16/2017 Elsevier Interactive Patient Education  2019 [employer]  

## 2018-07-01 LAB — CULTURE, BLOOD (ROUTINE X 2)
Culture: NO GROWTH
Culture: NO GROWTH
Special Requests: ADEQUATE
Special Requests: ADEQUATE

## 2018-07-05 ENCOUNTER — Emergency Department: Payer: Medicare Other

## 2018-07-05 ENCOUNTER — Other Ambulatory Visit: Payer: Self-pay

## 2018-07-05 ENCOUNTER — Emergency Department
Admission: EM | Admit: 2018-07-05 | Discharge: 2018-07-06 | Disposition: A | Discharge status: Skilled Nursing Facility | Payer: Medicare Other | Attending: Emergency Medicine | Admitting: Emergency Medicine

## 2018-07-05 ENCOUNTER — Encounter: Payer: Self-pay | Admitting: *Deleted

## 2018-07-05 DIAGNOSIS — Y9389 Activity, other specified: Secondary | ICD-10-CM | POA: Insufficient documentation

## 2018-07-05 DIAGNOSIS — W050XXA Fall from non-moving wheelchair, initial encounter: Secondary | ICD-10-CM | POA: Insufficient documentation

## 2018-07-05 DIAGNOSIS — Z7982 Long term (current) use of aspirin: Secondary | ICD-10-CM | POA: Diagnosis not present

## 2018-07-05 DIAGNOSIS — F0390 Unspecified dementia without behavioral disturbance: Secondary | ICD-10-CM | POA: Diagnosis not present

## 2018-07-05 DIAGNOSIS — Y999 Unspecified external cause status: Secondary | ICD-10-CM | POA: Diagnosis not present

## 2018-07-05 DIAGNOSIS — I10 Essential (primary) hypertension: Secondary | ICD-10-CM | POA: Diagnosis not present

## 2018-07-05 DIAGNOSIS — S0003XA Contusion of scalp, initial encounter: Secondary | ICD-10-CM | POA: Diagnosis not present

## 2018-07-05 DIAGNOSIS — Z79899 Other long term (current) drug therapy: Secondary | ICD-10-CM | POA: Diagnosis not present

## 2018-07-05 DIAGNOSIS — Y92129 Unspecified place in nursing home as the place of occurrence of the external cause: Secondary | ICD-10-CM | POA: Diagnosis not present

## 2018-07-05 DIAGNOSIS — W19XXXA Unspecified fall, initial encounter: Secondary | ICD-10-CM

## 2018-07-05 DIAGNOSIS — S0990XA Unspecified injury of head, initial encounter: Secondary | ICD-10-CM | POA: Diagnosis present

## 2018-07-05 HISTORY — DX: Unspecified dementia, unspecified severity, without behavioral disturbance, psychotic disturbance, mood disturbance, and anxiety: F03.90

## 2018-07-05 NOTE — ED Provider Notes (Signed)
[facility] Emergency Department Provider Note  ____________________________________________   I have reviewed the triage vital signs and the nursing notes.   HISTORY  Chief Complaint Fall   History limited by and level 5 caveat due to: Dementia   HPI Sabrina Burke is a 85 y.o. female who presents to the emergency department today after a fall when she was getting up from her wheelchair. The patient cannot give a great history. She states that she did hit her head on the ground. She denies any pain in her head or neck. She denies any recent illness or chest pain. Denies any shortness of breath.    Records reviewed. Per medical record review patient has a history of dementia. HTN.   Past Medical History:  Diagnosis Date  . Dementia ([place])   . Hypertension     Patient Active Problem List   Diagnosis Date Noted  . Sepsis ([place]) 06/26/2018    History reviewed. No pertinent surgical history.  Prior to Admission medications   Medication Sig Start Date End Date Taking? Authorizing Provider  acetaminophen (TYLENOL) 325 MG tablet Take 650 mg by mouth every 6 (six) hours as needed for mild pain or fever.    [provider]  alum & mag hydroxide-simeth (MAALOX/MYLANTA) 200-200-20 MG/5ML suspension Take 30 mLs by mouth as needed for indigestion or heartburn.    [provider]  amLODipine (NORVASC) 10 MG tablet Take 1 tablet (10 mg total) by mouth daily. 06/28/18   Loletha Grayer, MD  aspirin EC 81 MG EC tablet Take 1 tablet (81 mg total) by mouth daily. 06/28/18   Loletha Grayer, MD  atenolol (TENORMIN) 25 MG tablet Take 0.5 tablets (12.5 mg total) by mouth daily. 06/28/18   Loletha Grayer, MD  bisacodyl (DULCOLAX) 5 MG EC tablet Take 5 mg by mouth daily as needed for moderate constipation.    [provider]  cephALEXin (KEFLEX) 500 MG capsule Take 1 capsule (500 mg total) by mouth 2 (two) times daily for 10 days. 06/29/18 07/09/18   Loletha Grayer, MD  cholecalciferol (VITAMIN D) 1000 units tablet Take 2,000 Units by mouth daily.     [provider]  docusate sodium (COLACE) 100 MG capsule Take 100 mg by mouth at bedtime.    [provider]  feeding supplement, ENSURE ENLIVE, (ENSURE ENLIVE) LIQD Take 237 mLs by mouth 2 (two) times daily between meals. 06/28/18   Loletha Grayer, MD  guaiFENesin (ROBITUSSIN) 100 MG/5ML liquid Take 200 mg by mouth 3 (three) times daily as needed for cough.    [provider]  levothyroxine (SYNTHROID) 88 MCG tablet Take 88 mcg by mouth daily before breakfast.     [provider]  loperamide (IMODIUM) 2 MG capsule Take 2 mg by mouth as needed for diarrhea or loose stools.    [provider]  LORazepam (ATIVAN) 0.5 MG tablet Take 0.5 tablets (0.25 mg total) by mouth 2 (two) times daily. Takes at 1400 and 2100 06/28/18   Loletha Grayer, MD  magnesium hydroxide (MILK OF MAGNESIA) 400 MG/5ML suspension Take 30 mLs by mouth daily as needed for mild constipation.    [provider]  Multiple Vitamin (DAILY VITE PO) Take 1 tablet by mouth daily.    [provider]  neomycin-bacitracin-polymyxin (NEOSPORIN) ointment Apply 1 application topically as needed for wound care.    [provider]    Allergies Patient has no known allergies.  No family history on file.  Social History  Social History   Tobacco Use  . Smoking status: Never Smoker  . Smokeless tobacco: Never Used  . Tobacco comment: pt with advance dementia  Substance Use Topics  . Alcohol use: Not Currently  . Drug use: Not Currently    Review of Systems Constitutional: No fever/chills Eyes: No visual changes. ENT: No sore throat. Cardiovascular: Denies chest pain. Respiratory: Denies shortness of breath. Gastrointestinal: No abdominal pain.  No nausea, no vomiting.  No diarrhea.   Genitourinary: Negative for dysuria. Musculoskeletal: Negative for back  pain. Skin: Negative for rash. Neurological: Negative for headaches, focal weakness or numbness.  ____________________________________________   PHYSICAL EXAM:  VITAL SIGNS: ED Triage Vitals  Enc Vitals Group     BP 07/05/18 2002 (!) 142/49     Pulse Rate 07/05/18 2002 61     Resp 07/05/18 2006 12     Temp 07/05/18 2002 98.2 F (36.8 C)     Temp Source 07/05/18 2002 Oral     SpO2 07/05/18 2002 97 %     Weight 07/05/18 2003 124 lb (56.2 kg)     Height 07/05/18 2003 5\' 6"  (1.676 m)     Head Circumference --      Peak Flow --      Pain Score 07/05/18 2225 0   Constitutional: Alert and oriented.  Eyes: Conjunctivae are normal.  ENT      Head: Normocephalic. Left forehead hematoma.       Nose: No congestion/rhinnorhea.      Mouth/Throat: Mucous membranes are moist.      Neck: No stridor. No midline tenderness.  Hematological/Lymphatic/Immunilogical: No cervical lymphadenopathy. Cardiovascular: Normal rate, regular rhythm.  No murmurs, rubs, or gallops.  Respiratory: Normal respiratory effort without tachypnea nor retractions. Breath sounds are clear and equal bilaterally. No wheezes/rales/rhonchi. Gastrointestinal: Soft and non tender. No rebound. No guarding.  Genitourinary: Deferred Musculoskeletal: Normal range of motion in all extremities. No lower extremity edema. Neurologic:  Normal speech and language. No gross focal neurologic deficits are appreciated.  Skin:  Skin is warm, dry and intact. No rash noted. Psychiatric: Mood and affect are normal. Speech and behavior are normal. Patient exhibits appropriate insight and judgment.  ____________________________________________    LABS (pertinent positives/negatives)  None  ____________________________________________   EKG  None  ____________________________________________    RADIOLOGY  CT head/cervical spine No acute  abnormality  ____________________________________________   PROCEDURES  Procedures  ____________________________________________   INITIAL IMPRESSION / ASSESSMENT AND PLAN / ED COURSE  Pertinent labs & imaging results that were available during my care of the patient were reviewed by me and considered in my medical decision making (see chart for details).   Patient presents to the emergency department today because of concern for fall. The patient has history of falls. Was getting out of the wheelchair. Head CT and cervical spine CT without concerning findings. Patient denies any medical complaints.    ____________________________________________   FINAL CLINICAL IMPRESSION(S) / ED DIAGNOSES  Final diagnoses:  Fall, initial encounter  Contusion of scalp, initial encounter     Note: This dictation was prepared with Nurse, children'sDragon dictation. Any transcriptional errors that result from this process are unintentional     Phineas SemenGoodman, Diontae Route, MD 07/05/18 2310

## 2018-07-05 NOTE — ED Triage Notes (Signed)
Per EMS pt was getting up from her wheelchair at [facility] and fell. This is a recurrent issue for the patient. She has a golf ball sized lump on the top of her left side forehead. Pt states it hurts a little bit. Pt is from the dementia unit

## 2018-07-05 NOTE — ED Notes (Signed)
Pt unable to sign  

## 2018-07-05 NOTE — Discharge Instructions (Addendum)
Please seek medical attention for any high fevers, chest pain, shortness of breath, change in behavior, persistent vomiting, bloody stool or any other new or concerning symptoms.  

## 2018-08-21 ENCOUNTER — Emergency Department: Payer: Medicare Other

## 2018-08-21 ENCOUNTER — Other Ambulatory Visit: Payer: Self-pay

## 2018-08-21 ENCOUNTER — Inpatient Hospital Stay
Admission: EM | Admit: 2018-08-21 | Discharge: 2018-08-24 | DRG: 871 | Disposition: A | Discharge status: Skilled Nursing Facility | Payer: Medicare Other | Source: Skilled Nursing Facility | Attending: Internal Medicine | Admitting: Internal Medicine

## 2018-08-21 DIAGNOSIS — E039 Hypothyroidism, unspecified: Secondary | ICD-10-CM | POA: Diagnosis present

## 2018-08-21 DIAGNOSIS — D649 Anemia, unspecified: Secondary | ICD-10-CM | POA: Diagnosis present

## 2018-08-21 DIAGNOSIS — R6520 Severe sepsis without septic shock: Secondary | ICD-10-CM | POA: Diagnosis present

## 2018-08-21 DIAGNOSIS — N183 Chronic kidney disease, stage 3 (moderate): Secondary | ICD-10-CM | POA: Diagnosis present

## 2018-08-21 DIAGNOSIS — N390 Urinary tract infection, site not specified: Secondary | ICD-10-CM | POA: Diagnosis present

## 2018-08-21 DIAGNOSIS — A4159 Other Gram-negative sepsis: Principal | ICD-10-CM | POA: Diagnosis present

## 2018-08-21 DIAGNOSIS — G9341 Metabolic encephalopathy: Secondary | ICD-10-CM | POA: Diagnosis present

## 2018-08-21 DIAGNOSIS — A419 Sepsis, unspecified organism: Secondary | ICD-10-CM | POA: Diagnosis present

## 2018-08-21 DIAGNOSIS — Z79899 Other long term (current) drug therapy: Secondary | ICD-10-CM

## 2018-08-21 DIAGNOSIS — I129 Hypertensive chronic kidney disease with stage 1 through stage 4 chronic kidney disease, or unspecified chronic kidney disease: Secondary | ICD-10-CM | POA: Diagnosis present

## 2018-08-21 DIAGNOSIS — Z79890 Hormone replacement therapy (postmenopausal): Secondary | ICD-10-CM

## 2018-08-21 DIAGNOSIS — R4182 Altered mental status, unspecified: Secondary | ICD-10-CM | POA: Diagnosis not present

## 2018-08-21 DIAGNOSIS — Z7982 Long term (current) use of aspirin: Secondary | ICD-10-CM

## 2018-08-21 DIAGNOSIS — Z993 Dependence on wheelchair: Secondary | ICD-10-CM

## 2018-08-21 DIAGNOSIS — Z66 Do not resuscitate: Secondary | ICD-10-CM | POA: Diagnosis present

## 2018-08-21 DIAGNOSIS — Z8619 Personal history of other infectious and parasitic diseases: Secondary | ICD-10-CM

## 2018-08-21 DIAGNOSIS — Z20828 Contact with and (suspected) exposure to other viral communicable diseases: Secondary | ICD-10-CM | POA: Diagnosis present

## 2018-08-21 DIAGNOSIS — F0390 Unspecified dementia without behavioral disturbance: Secondary | ICD-10-CM | POA: Diagnosis present

## 2018-08-21 LAB — COMPREHENSIVE METABOLIC PANEL WITH GFR
ALT: 14 U/L (ref 0–44)
AST: 17 U/L (ref 15–41)
Albumin: 3 g/dL — ABNORMAL LOW (ref 3.5–5.0)
Alkaline Phosphatase: 55 U/L (ref 38–126)
Anion gap: 8 (ref 5–15)
BUN: 39 mg/dL — ABNORMAL HIGH (ref 8–23)
CO2: 24 mmol/L (ref 22–32)
Calcium: 8.7 mg/dL — ABNORMAL LOW (ref 8.9–10.3)
Chloride: 105 mmol/L (ref 98–111)
Creatinine, Ser: 1.37 mg/dL — ABNORMAL HIGH (ref 0.44–1.00)
GFR calc Af Amer: 40 mL/min — ABNORMAL LOW
GFR calc non Af Amer: 34 mL/min — ABNORMAL LOW
Glucose, Bld: 149 mg/dL — ABNORMAL HIGH (ref 70–99)
Potassium: 4.7 mmol/L (ref 3.5–5.1)
Sodium: 137 mmol/L (ref 135–145)
Total Bilirubin: 0.4 mg/dL (ref 0.3–1.2)
Total Protein: 7.1 g/dL (ref 6.5–8.1)

## 2018-08-21 LAB — SARS CORONAVIRUS 2 BY RT PCR (HOSPITAL ORDER, PERFORMED IN ~~LOC~~ HOSPITAL LAB): SARS Coronavirus 2: NEGATIVE

## 2018-08-21 LAB — CBC WITH DIFFERENTIAL/PLATELET
Abs Immature Granulocytes: 0.08 10*3/uL — ABNORMAL HIGH (ref 0.00–0.07)
Basophils Absolute: 0.1 10*3/uL (ref 0.0–0.1)
Basophils Relative: 0 %
Eosinophils Absolute: 0.1 10*3/uL (ref 0.0–0.5)
Eosinophils Relative: 0 %
HCT: 30.7 % — ABNORMAL LOW (ref 36.0–46.0)
Hemoglobin: 10.1 g/dL — ABNORMAL LOW (ref 12.0–15.0)
Immature Granulocytes: 1 %
Lymphocytes Relative: 6 %
Lymphs Abs: 0.9 10*3/uL (ref 0.7–4.0)
MCH: 32.3 pg (ref 26.0–34.0)
MCHC: 32.9 g/dL (ref 30.0–36.0)
MCV: 98.1 fL (ref 80.0–100.0)
Monocytes Absolute: 1 10*3/uL (ref 0.1–1.0)
Monocytes Relative: 7 %
Neutro Abs: 12.5 10*3/uL — ABNORMAL HIGH (ref 1.7–7.7)
Neutrophils Relative %: 86 %
Platelets: 340 10*3/uL (ref 150–400)
RBC: 3.13 MIL/uL — ABNORMAL LOW (ref 3.87–5.11)
RDW: 12.4 % (ref 11.5–15.5)
WBC: 14.5 10*3/uL — ABNORMAL HIGH (ref 4.0–10.5)
nRBC: 0 % (ref 0.0–0.2)

## 2018-08-21 LAB — URINALYSIS, ROUTINE W REFLEX MICROSCOPIC
Bilirubin Urine: NEGATIVE
Glucose, UA: NEGATIVE mg/dL
Ketones, ur: NEGATIVE mg/dL
Nitrite: NEGATIVE
Protein, ur: 100 mg/dL — AB
Specific Gravity, Urine: 1.011 (ref 1.005–1.030)
Squamous Epithelial / LPF: NONE SEEN (ref 0–5)
WBC, UA: >50 WBC/hpf — ABNORMAL HIGH (ref 0–5)
pH: 6 (ref 5.0–8.0)

## 2018-08-21 LAB — LACTIC ACID, PLASMA: Lactic Acid, Venous: 1 mmol/L (ref 0.5–1.9)

## 2018-08-21 LAB — PROTIME-INR
INR: 1.1 (ref 0.8–1.2)
Prothrombin Time: 13.7 seconds (ref 11.4–15.2)

## 2018-08-21 LAB — APTT: aPTT: 30 seconds (ref 24–36)

## 2018-08-21 MED ORDER — SODIUM CHLORIDE 0.9 % IV BOLUS (SEPSIS)
1000.0000 mL | Freq: Once | INTRAVENOUS | Status: AC
  Administered 2018-08-21: 20:00:00 1000 mL via INTRAVENOUS

## 2018-08-21 MED ORDER — SODIUM CHLORIDE 0.9 % IV BOLUS (SEPSIS)
500.0000 mL | Freq: Once | INTRAVENOUS | Status: AC
  Administered 2018-08-21: 500 mL via INTRAVENOUS

## 2018-08-21 MED ORDER — SODIUM CHLORIDE 0.9 % IV BOLUS (SEPSIS)
250.0000 mL | Freq: Once | INTRAVENOUS | Status: AC
  Administered 2018-08-21: 250 mL via INTRAVENOUS

## 2018-08-21 MED ORDER — ACETAMINOPHEN 650 MG RE SUPP
650.0000 mg | Freq: Once | RECTAL | Status: AC
  Administered 2018-08-21: 650 mg via RECTAL
  Filled 2018-08-21: qty 1

## 2018-08-21 MED ORDER — METRONIDAZOLE IN NACL 5-0.79 MG/ML-% IV SOLN
500.0000 mg | Freq: Once | INTRAVENOUS | Status: AC
  Administered 2018-08-21: 500 mg via INTRAVENOUS
  Filled 2018-08-21: qty 100

## 2018-08-21 MED ORDER — VANCOMYCIN HCL 10 G IV SOLR
1250.0000 mg | Freq: Once | INTRAVENOUS | Status: AC
  Administered 2018-08-21: 21:00:00 1250 mg via INTRAVENOUS
  Filled 2018-08-21: qty 1250

## 2018-08-21 MED ORDER — VANCOMYCIN HCL IN DEXTROSE 1-5 GM/200ML-% IV SOLN: 1000.0000 mg | Freq: Once | INTRAVENOUS | Status: DC

## 2018-08-21 MED ORDER — SODIUM CHLORIDE 0.9 % IV SOLN
2.0000 g | Freq: Once | INTRAVENOUS | Status: AC
  Administered 2018-08-21: 20:00:00 2 g via INTRAVENOUS
  Filled 2018-08-21: qty 2

## 2018-08-21 NOTE — ED Notes (Signed)
Pt sleeping, resps unlabored. Pt continues to remove cardiac monitor and cont pox intermittently. Skin much cooler. Bed in low locked position with siderails up x2.

## 2018-08-21 NOTE — Progress Notes (Signed)
PHARMACY -  BRIEF ANTIBIOTIC NOTE   Pharmacy has received consult(s) for Vancomycin and Cefepime from an ED provider.  The patient's profile has been reviewed for ht/wt/allergies/indication/available labs.    One time order(s) placed for Vancomycin 1.25g IV and Cefepime 2g IV.  Further antibiotics/pharmacy consults should be ordered by admitting physician if indicated.                       Thank you, Pearla Dubonnet 08/21/2018  7:53 PM

## 2018-08-21 NOTE — ED Provider Notes (Addendum)
[facility] Emergency Department Provider Note  ____________________________________________   First MD Initiated Contact with Patient 08/21/18 1928     (approximate)  I have reviewed the triage vital signs and the nursing notes.   HISTORY  Chief Complaint Fall and Code Sepsis    HPI Sabrina Burke is a 85 y.o. female  With h/o dementia, HTN, here with altered mental status, fall. History limited 2/2 dementia. Per report from EMS, pt lives in [facility]. She fell out of a wheelchair which is abnormal for her. She has been more agitated. H/o sepsis with similar sx in past.   On my assessment, pt demented, does not know where she is or why she is here.  Level 5 caveat invoked as remainder of history, ROS, and physical exam limited due to patient's dementia     Past Medical History:  Diagnosis Date  . Dementia ([facility])   . Hypertension     Patient Active Problem List   Diagnosis Date Noted  . Sepsis ([facility]) 06/26/2018    No past surgical history on file.  Prior to Admission medications   Medication Sig Start Date End Date Taking? Authorizing Provider  acetaminophen (TYLENOL) 325 MG tablet Take 650 mg by mouth every 6 (six) hours as needed for mild pain or fever.    [provider]  alum & mag hydroxide-simeth (MAALOX/MYLANTA) 200-200-20 MG/5ML suspension Take 30 mLs by mouth as needed for indigestion or heartburn.    [provider]  amLODipine (NORVASC) 10 MG tablet Take 1 tablet (10 mg total) by mouth daily. 06/28/18   Alford HighlandWieting, Richard, MD  aspirin EC 81 MG EC tablet Take 1 tablet (81 mg total) by mouth daily. 06/28/18   Alford HighlandWieting, Richard, MD  atenolol (TENORMIN) 25 MG tablet Take 0.5 tablets (12.5 mg total) by mouth daily. 06/28/18   Alford HighlandWieting, Richard, MD  bisacodyl (DULCOLAX) 5 MG EC tablet Take 5 mg by mouth daily as needed for moderate constipation.    [provider]  cholecalciferol (VITAMIN D) 1000 units tablet Take  2,000 Units by mouth daily.     [provider]  docusate sodium (COLACE) 100 MG capsule Take 100 mg by mouth at bedtime.    [provider]  feeding supplement, ENSURE ENLIVE, (ENSURE ENLIVE) LIQD Take 237 mLs by mouth 2 (two) times daily between meals. 06/28/18   Alford HighlandWieting, Richard, MD  guaiFENesin (ROBITUSSIN) 100 MG/5ML liquid Take 200 mg by mouth 3 (three) times daily as needed for cough.    [provider]  levothyroxine (SYNTHROID) 88 MCG tablet Take 88 mcg by mouth daily before breakfast.     [provider]  loperamide (IMODIUM) 2 MG capsule Take 2 mg by mouth as needed for diarrhea or loose stools.    [provider]  LORazepam (ATIVAN) 0.5 MG tablet Take 0.5 tablets (0.25 mg total) by mouth 2 (two) times daily. Takes at 1400 and 2100 06/28/18   Alford HighlandWieting, Richard, MD  magnesium hydroxide (MILK OF MAGNESIA) 400 MG/5ML suspension Take 30 mLs by mouth daily as needed for mild constipation.    [provider]  Multiple Vitamin (DAILY VITE PO) Take 1 tablet by mouth daily.    [provider]  neomycin-bacitracin-polymyxin (NEOSPORIN) ointment Apply 1 application topically as needed for wound care.    [provider]    Allergies Patient has no known allergies.  No family history on file.  Social History Social History   Tobacco Use  . Smoking  status: Never Smoker  . Smokeless tobacco: Never Used  . Tobacco comment: pt with advance dementia  Substance Use Topics  . Alcohol use: Not Currently  . Drug use: Not Currently    Review of Systems  Review of Systems  Unable to perform ROS: Dementia     ____________________________________________  PHYSICAL EXAM:      VITAL SIGNS: ED Triage Vitals  Enc Vitals Group     BP      Pulse      Resp      Temp      Temp src      SpO2      Weight      Height      Head Circumference      Peak Flow      Pain Score      Pain Loc      Pain Edu?      Excl. in [place]?      Physical Exam Vitals signs and nursing note reviewed.  Constitutional:      General: She is not in acute distress.    Appearance: She is well-developed. She is ill-appearing.  HENT:     Head: Normocephalic and atraumatic.  Eyes:     Conjunctiva/sclera: Conjunctivae normal.  Neck:     Musculoskeletal: Neck supple.  Cardiovascular:     Rate and Rhythm: Normal rate and regular rhythm.     Heart sounds: Normal heart sounds. No murmur. No friction rub.  Pulmonary:     Effort: Pulmonary effort is normal. No respiratory distress.     Breath sounds: Normal breath sounds. No wheezing or rales.  Abdominal:     General: There is no distension.     Palpations: Abdomen is soft.     Tenderness: There is no abdominal tenderness.  Skin:    General: Skin is warm.     Capillary Refill: Capillary refill takes less than 2 seconds.  Neurological:     Mental Status: She is alert. She is disoriented.     Motor: No abnormal muscle tone.       ____________________________________________   LABS (all labs ordered are listed, but only abnormal results are displayed)  Labs Reviewed  COMPREHENSIVE METABOLIC PANEL - Abnormal; Notable for the following components:      Result Value   Glucose, Bld 149 (*)    BUN 39 (*)    Creatinine, Ser 1.37 (*)    Calcium 8.7 (*)    Albumin 3.0 (*)    GFR calc non Af Amer 34 (*)    GFR calc Af Amer 40 (*)    All other components within normal limits  CBC WITH DIFFERENTIAL/PLATELET - Abnormal; Notable for the following components:   WBC 14.5 (*)    RBC 3.13 (*)    Hemoglobin 10.1 (*)    HCT 30.7 (*)    Neutro Abs 12.5 (*)    Abs Immature Granulocytes 0.08 (*)    All other components within normal limits  URINALYSIS, ROUTINE W REFLEX MICROSCOPIC - Abnormal; Notable for the following components:   Color, Urine YELLOW (*)    APPearance TURBID (*)    Hgb urine dipstick SMALL (*)    Protein, ur 100 (*)    Leukocytes,Ua LARGE (*)    WBC, UA >50 (*)     Bacteria, UA FEW (*)    All other components within normal limits  CULTURE, BLOOD (ROUTINE X 2)  CULTURE, BLOOD (ROUTINE X 2)  URINE CULTURE  SARS CORONAVIRUS 2 (HOSPITAL ORDER, PERFORMED IN [facility])  LACTIC ACID, PLASMA  PROTIME-INR  APTT    ____________________________________________  EKG: Normal sinus rhythm, VR 85. QRS 118, QTc 446. No acute ischemic changes. ________________________________________  RADIOLOGY All imaging, including plain films, CT scans, and ultrasounds, independently reviewed by me, and interpretations confirmed via formal radiology reads.  ED MD interpretation:   Imaging pending  Official radiology report(s): No results found.  ____________________________________________  PROCEDURES   Procedure(s) performed (including Critical Care):  .Critical Care Performed by: Shaune PollackIsaacs, Kaspian Muccio, MD Authorized by: Shaune PollackIsaacs, Emelda Kohlbeck, MD   Critical care provider statement:    Critical care time (minutes):  35   Critical care time was exclusive of:  Separately billable procedures and treating other patients and teaching time   Critical care was necessary to treat or prevent imminent or life-threatening deterioration of the following conditions:  Cardiac failure, circulatory failure and sepsis   Critical care was time spent personally by me on the following activities:  Development of treatment plan with patient or surrogate, discussions with consultants, evaluation of patient's response to treatment, examination of patient, obtaining history from patient or surrogate, ordering and performing treatments and interventions, ordering and review of laboratory studies, ordering and review of radiographic studies, pulse oximetry, re-evaluation of patient's condition and review of old charts   I assumed direction of critical care for this patient from another provider in my specialty: no      ____________________________________________  INITIAL IMPRESSION  / MDM / ASSESSMENT AND PLAN / ED COURSE  As part of my medical decision making, I reviewed the following data within the electronic MEDICAL RECORD NUMBER Notes from prior ED visits and [place] Controlled Substance Database      *Sabrina Burke was evaluated in Emergency Department on 08/21/2018 for the symptoms described in the history of present illness. She was evaluated in the context of the global COVID-19 pandemic, which necessitated consideration that the patient might be at risk for infection with the SARS-CoV-2 virus that causes COVID-19. Institutional protocols and algorithms that pertain to the evaluation of patients at risk for COVID-19 are in a state of rapid change based on information released by regulatory bodies including the CDC and federal and state organizations. These policies and algorithms were followed during the patient's care in the ED.  Some ED evaluations and interventions may be delayed as a result of limited staffing during the pandemic.*   Clinical Course as of Aug 20 2125  Sat Aug 21, 2018  1948 85 yo F here w/ fever to 104, fall. No apparent trauma related to the fall. Suspect sepsis, likely from UTI though will also check COVID, CXR for PNA. IVF, ABX started. No hypotension or signs of shock. Cultures from June admission reviewed, show Klebsiella sensitive to cephalosporins on culture.   [CI]  2123 LA normal, reassuring. CBC w/ leukocytosis likely due to sepsis. UA c/w UTI. Admit.   [CI]  2123 Blood Culture (routine x 2) [CI]    Clinical Course User Index [CI] Shaune PollackIsaacs, Aleksandra Raben, MD    Medical Decision Making: As above. Sepsis likely 2/2 UTI.  ____________________________________________  FINAL CLINICAL IMPRESSION(S) / ED DIAGNOSES  Final diagnoses:  Sepsis secondary to UTI [facility](HCC)     MEDICATIONS GIVEN DURING THIS VISIT:  Medications  vancomycin (VANCOCIN) 1,250 mg in sodium chloride 0.9 % 250 mL IVPB (1,250 mg Intravenous New Bag/Given 08/21/18 2030)  sodium  chloride 0.9 % bolus 1,000 mL (1,000 mLs Intravenous New Bag/Given 08/21/18  2000)    And  sodium chloride 0.9 % bolus 500 mL (0 mLs Intravenous Stopped 08/21/18 2023)    And  sodium chloride 0.9 % bolus 250 mL (0 mLs Intravenous Stopped 08/21/18 2023)  ceFEPIme (MAXIPIME) 2 g in sodium chloride 0.9 % 100 mL IVPB (0 g Intravenous Stopped 08/21/18 2027)  metroNIDAZOLE (FLAGYL) IVPB 500 mg (0 mg Intravenous Stopped 08/21/18 2106)  acetaminophen (TYLENOL) suppository 650 mg (650 mg Rectal Given 08/21/18 2004)     ED Discharge Orders    None       Note:  This document was prepared using Dragon voice recognition software and may include unintentional dictation errors.   Shaune PollackIsaacs, Brandin Dilday, MD 08/21/18 2127    Shaune PollackIsaacs, Mazi Schuff, MD 08/29/18 1341

## 2018-08-21 NOTE — ED Notes (Signed)
Pt sleeping. 

## 2018-08-21 NOTE — ED Triage Notes (Signed)
Pt with fall from wheelchair with hematoma to forehead. Pt very hot to touch, covered in diarrhea. Pt with history of dementia.

## 2018-08-21 NOTE — Progress Notes (Signed)
CODE SEPSIS - PHARMACY COMMUNICATION  **Broad Spectrum Antibiotics should be administered within 1 hour of Sepsis diagnosis**  Time Code Sepsis Called/Page Received: 1942  Antibiotics Ordered: Vancomycin,Cefepime,Flagyl  Time of 1st antibiotic administration: 2003  Additional action taken by pharmacy: none  If necessary, Name of Provider/Nurse Contacted: N/A    Pearla Dubonnet ,PharmD Clinical Pharmacist  08/21/2018  8:11 PM

## 2018-08-22 DIAGNOSIS — N390 Urinary tract infection, site not specified: Secondary | ICD-10-CM | POA: Diagnosis present

## 2018-08-22 DIAGNOSIS — Z79890 Hormone replacement therapy (postmenopausal): Secondary | ICD-10-CM | POA: Diagnosis not present

## 2018-08-22 DIAGNOSIS — Z79899 Other long term (current) drug therapy: Secondary | ICD-10-CM | POA: Diagnosis not present

## 2018-08-22 DIAGNOSIS — Z7982 Long term (current) use of aspirin: Secondary | ICD-10-CM | POA: Diagnosis not present

## 2018-08-22 DIAGNOSIS — E039 Hypothyroidism, unspecified: Secondary | ICD-10-CM | POA: Diagnosis present

## 2018-08-22 DIAGNOSIS — Z20828 Contact with and (suspected) exposure to other viral communicable diseases: Secondary | ICD-10-CM | POA: Diagnosis present

## 2018-08-22 DIAGNOSIS — Z8619 Personal history of other infectious and parasitic diseases: Secondary | ICD-10-CM | POA: Diagnosis not present

## 2018-08-22 DIAGNOSIS — I129 Hypertensive chronic kidney disease with stage 1 through stage 4 chronic kidney disease, or unspecified chronic kidney disease: Secondary | ICD-10-CM | POA: Diagnosis present

## 2018-08-22 DIAGNOSIS — F0390 Unspecified dementia without behavioral disturbance: Secondary | ICD-10-CM | POA: Diagnosis present

## 2018-08-22 DIAGNOSIS — N183 Chronic kidney disease, stage 3 (moderate): Secondary | ICD-10-CM | POA: Diagnosis present

## 2018-08-22 DIAGNOSIS — Z993 Dependence on wheelchair: Secondary | ICD-10-CM | POA: Diagnosis not present

## 2018-08-22 DIAGNOSIS — Z66 Do not resuscitate: Secondary | ICD-10-CM | POA: Diagnosis present

## 2018-08-22 DIAGNOSIS — R6520 Severe sepsis without septic shock: Secondary | ICD-10-CM | POA: Diagnosis present

## 2018-08-22 DIAGNOSIS — G9341 Metabolic encephalopathy: Secondary | ICD-10-CM | POA: Diagnosis present

## 2018-08-22 DIAGNOSIS — R4182 Altered mental status, unspecified: Secondary | ICD-10-CM | POA: Diagnosis present

## 2018-08-22 DIAGNOSIS — D649 Anemia, unspecified: Secondary | ICD-10-CM | POA: Diagnosis present

## 2018-08-22 DIAGNOSIS — A4159 Other Gram-negative sepsis: Secondary | ICD-10-CM | POA: Diagnosis present

## 2018-08-22 LAB — CORTISOL-AM, BLOOD: Cortisol - AM: 22 ug/dL (ref 6.7–22.6)

## 2018-08-22 LAB — BASIC METABOLIC PANEL
Anion gap: 5 (ref 5–15)
BUN: 33 mg/dL — ABNORMAL HIGH (ref 8–23)
CO2: 24 mmol/L (ref 22–32)
Calcium: 8.2 mg/dL — ABNORMAL LOW (ref 8.9–10.3)
Chloride: 112 mmol/L — ABNORMAL HIGH (ref 98–111)
Creatinine, Ser: 1.3 mg/dL — ABNORMAL HIGH (ref 0.44–1.00)
GFR calc Af Amer: 42 mL/min — ABNORMAL LOW (ref >60)
GFR calc non Af Amer: 36 mL/min — ABNORMAL LOW (ref >60)
Glucose, Bld: 101 mg/dL — ABNORMAL HIGH (ref 70–99)
Potassium: 3.9 mmol/L (ref 3.5–5.1)
Sodium: 141 mmol/L (ref 135–145)

## 2018-08-22 LAB — CBC
HCT: 27.2 % — ABNORMAL LOW (ref 36.0–46.0)
Hemoglobin: 8.8 g/dL — ABNORMAL LOW (ref 12.0–15.0)
MCH: 32.6 pg (ref 26.0–34.0)
MCHC: 32.4 g/dL (ref 30.0–36.0)
MCV: 100.7 fL — ABNORMAL HIGH (ref 80.0–100.0)
Platelets: 254 10*3/uL (ref 150–400)
RBC: 2.7 MIL/uL — ABNORMAL LOW (ref 3.87–5.11)
RDW: 12.6 % (ref 11.5–15.5)
WBC: 10.6 10*3/uL — ABNORMAL HIGH (ref 4.0–10.5)
nRBC: 0 % (ref 0.0–0.2)

## 2018-08-22 LAB — VANCOMYCIN, RANDOM: Vancomycin Rm: 14

## 2018-08-22 LAB — PROCALCITONIN: Procalcitonin: 0.52 ng/mL

## 2018-08-22 LAB — PROTIME-INR
INR: 1.1 (ref 0.8–1.2)
Prothrombin Time: 13.8 seconds (ref 11.4–15.2)

## 2018-08-22 LAB — MRSA PCR SCREENING: MRSA by PCR: NEGATIVE

## 2018-08-22 MED ORDER — SODIUM CHLORIDE 0.9 % IV SOLN: 2.0000 g | Freq: Once | INTRAVENOUS | Status: DC

## 2018-08-22 MED ORDER — ENOXAPARIN SODIUM 40 MG/0.4ML ~~LOC~~ SOLN: 30.0000 mg | SUBCUTANEOUS | Status: DC

## 2018-08-22 MED ORDER — POLYETHYLENE GLYCOL 3350 17 G PO PACK: 17.0000 g | PACK | Freq: Every day | ORAL | Status: DC | PRN

## 2018-08-22 MED ORDER — SODIUM CHLORIDE 0.9 % IV SOLN
INTRAVENOUS | Status: DC
  Administered 2018-08-22 (×2): via INTRAVENOUS

## 2018-08-22 MED ORDER — ACETAMINOPHEN 650 MG RE SUPP: 650.0000 mg | Freq: Four times a day (QID) | RECTAL | Status: DC | PRN

## 2018-08-22 MED ORDER — LORAZEPAM 0.5 MG PO TABS: 0.2500 mg | ORAL_TABLET | Freq: Two times a day (BID) | ORAL | Status: DC

## 2018-08-22 MED ORDER — ONDANSETRON HCL 4 MG PO TABS: 4.0000 mg | ORAL_TABLET | Freq: Four times a day (QID) | ORAL | Status: DC | PRN

## 2018-08-22 MED ORDER — ONDANSETRON HCL 4 MG/2ML IJ SOLN: 4.0000 mg | Freq: Four times a day (QID) | INTRAMUSCULAR | Status: DC | PRN

## 2018-08-22 MED ORDER — ACETAMINOPHEN 325 MG PO TABS: 650.0000 mg | ORAL_TABLET | Freq: Four times a day (QID) | ORAL | Status: DC | PRN

## 2018-08-22 MED ORDER — ENSURE ENLIVE PO LIQD
237.0000 mL | Freq: Two times a day (BID) | ORAL | Status: DC
  Administered 2018-08-22 – 2018-08-24 (×5): 237 mL via ORAL

## 2018-08-22 MED ORDER — VANCOMYCIN HCL 1.5 G IV SOLR
1500.0000 mg | Freq: Once | INTRAVENOUS | Status: DC
  Filled 2018-08-22: qty 1500

## 2018-08-22 MED ORDER — ENOXAPARIN SODIUM 30 MG/0.3ML ~~LOC~~ SOLN
30.0000 mg | SUBCUTANEOUS | Status: DC
  Administered 2018-08-22 – 2018-08-24 (×3): 30 mg via SUBCUTANEOUS
  Filled 2018-08-22 (×3): qty 0.3

## 2018-08-22 MED ORDER — ASPIRIN EC 81 MG PO TBEC
81.0000 mg | DELAYED_RELEASE_TABLET | Freq: Every day | ORAL | Status: DC
  Administered 2018-08-22 – 2018-08-24 (×3): 81 mg via ORAL
  Filled 2018-08-22 (×3): qty 1

## 2018-08-22 MED ORDER — VANCOMYCIN HCL IN DEXTROSE 1-5 GM/200ML-% IV SOLN: 1000.0000 mg | Freq: Once | INTRAVENOUS | Status: DC

## 2018-08-22 MED ORDER — DOCUSATE SODIUM 100 MG PO CAPS
100.0000 mg | ORAL_CAPSULE | Freq: Every day | ORAL | Status: DC
  Administered 2018-08-22 – 2018-08-23 (×2): 100 mg via ORAL
  Filled 2018-08-22 (×2): qty 1

## 2018-08-22 MED ORDER — METRONIDAZOLE IN NACL 5-0.79 MG/ML-% IV SOLN
500.0000 mg | Freq: Three times a day (TID) | INTRAVENOUS | Status: DC
  Administered 2018-08-22 – 2018-08-23 (×4): 500 mg via INTRAVENOUS
  Filled 2018-08-22 (×6): qty 100

## 2018-08-22 MED ORDER — AMLODIPINE BESYLATE 10 MG PO TABS
10.0000 mg | ORAL_TABLET | Freq: Every day | ORAL | Status: DC
  Administered 2018-08-22 – 2018-08-24 (×3): 10 mg via ORAL
  Filled 2018-08-22 (×3): qty 1

## 2018-08-22 MED ORDER — SODIUM CHLORIDE 0.9 % IV SOLN
2.0000 g | Freq: Two times a day (BID) | INTRAVENOUS | Status: DC
  Administered 2018-08-22 – 2018-08-23 (×4): 2 g via INTRAVENOUS
  Filled 2018-08-22 (×7): qty 2

## 2018-08-22 MED ORDER — ATENOLOL 25 MG PO TABS
12.5000 mg | ORAL_TABLET | Freq: Every day | ORAL | Status: DC
  Administered 2018-08-22 – 2018-08-24 (×3): 12.5 mg via ORAL
  Filled 2018-08-22 (×4): qty 0.5

## 2018-08-22 MED ORDER — LEVOTHYROXINE SODIUM 88 MCG PO TABS
88.0000 ug | ORAL_TABLET | Freq: Every day | ORAL | Status: DC
  Administered 2018-08-22 – 2018-08-24 (×3): 88 ug via ORAL
  Filled 2018-08-22 (×4): qty 1

## 2018-08-22 MED ORDER — SODIUM CHLORIDE 0.9% FLUSH
3.0000 mL | Freq: Two times a day (BID) | INTRAVENOUS | Status: DC
  Administered 2018-08-22 – 2018-08-24 (×4): 3 mL via INTRAVENOUS

## 2018-08-22 MED ORDER — LORAZEPAM 0.5 MG PO TABS
0.2500 mg | ORAL_TABLET | Freq: Two times a day (BID) | ORAL | Status: DC
  Administered 2018-08-22 – 2018-08-23 (×4): 0.25 mg via ORAL
  Filled 2018-08-22 (×4): qty 1

## 2018-08-22 NOTE — ED Notes (Signed)
Pt cleansed of incontinent urine and stool.

## 2018-08-22 NOTE — Progress Notes (Addendum)
Pharmacy Antibiotic Note  Sabrina Burke is a 85 y.o. female admitted on 08/21/2018 with sepsis.  Pharmacy has been consulted for vanc/cefepime dosing. Patient admitted s/t fall from [facility] w/ h/o dementia and presents w/ AMS; no documented CKD; however, patient appears to be in AKI. Patient meets 3/4 SIRS Tmax 105.1, RR 22, WBC 14.5, PCT 0.24, left shift on diff, UA leuko +, few bacteria, WBC UA > 50.  Plan: Considering patient is in AKI w/ SCr 1.37 (BL SCr 1.2 - 1.3) will dose vanc based on random levels.  Goal random < 20 mcg/mL. Will administer vanc 15 mg/kg when random < 20 mcg/mL.   Will continue cefepime 2g IV q12h considering patient appears critical and could possibly deteriorate and CrCl not far off from 30 ml/min--expect renal function to improve w/ fluid administration.  Will continue to monitor renal function and will adjust dose as necessary.  Will continue to monitor s/sx of infx.  Weight: 130 lb (59 kg)  Temp (24hrs), Avg:103.1 F (39.5 C), Min:100.1 F (37.8 C), Max:105.1 F (40.6 C)  Recent Labs  Lab 08/21/18 1947  WBC 14.5*  CREATININE 1.37*  LATICACIDVEN 1.0    Estimated Creatinine Clearance: 25.9 mL/min (A) (by C-G formula based on SCr of 1.37 mg/dL (H)).    No Known Allergies  Thank you for allowing pharmacy to be a part of this patient's care.  Tobie Lords, PharmD, BCPS Clinical Pharmacist 08/22/2018 12:35 AM

## 2018-08-22 NOTE — Progress Notes (Signed)
Pharmacy Antibiotic Note  Sabrina Burke is a 85 y.o. female admitted on 08/21/2018 with sepsis.  Pharmacy has been consulted for vanc/cefepime dosing. Patient admitted s/t fall from [facility] w/ h/o dementia and presents w/ AMS; no documented CKD; however, patient appears to be in AKI. Patient meets 3/4 SIRS Tmax 105.1, RR 22, WBC 14.5, PCT 0.24, left shift on diff, UA leuko +, few bacteria, WBC UA > 50.  Plan: 08/02 @ 0500 VR 14 mcg/mL appropriate. Will re-dose w/ vanc 1.5g IV x 1 and recheck random level w/ am labs. Scr came down a little 1.37 >> 1.30 will continue to monitor.  Will continue cefepime 2g IV q12h considering patient appears critical and could possibly deteriorate and CrCl not far off from 30 ml/min--expect renal function to improve w/ fluid administration.  Will continue to monitor renal function and will adjust dose as necessary.  Will continue to monitor s/sx of infx.  Weight: 130 lb (59 kg)  Temp (24hrs), Avg:101.3 F (38.5 C), Min:98.4 F (36.9 C), Max:105.1 F (40.6 C)  Recent Labs  Lab 08/21/18 1947 08/22/18 0532 08/22/18 0539  WBC 14.5* 10.6*  --   CREATININE 1.37* 1.30*  --   LATICACIDVEN 1.0  --   --   VANCORANDOM  --   --  14    Estimated Creatinine Clearance: 27.3 mL/min (A) (by C-G formula based on SCr of 1.3 mg/dL (H)).    No Known Allergies  Thank you for allowing pharmacy to be a part of this patient's care.  Tobie Lords, PharmD, BCPS Clinical Pharmacist 08/22/2018 7:04 AM

## 2018-08-22 NOTE — Progress Notes (Signed)
PT Cancellation Note  Patient Details Name: Sabrina Burke MRN: [id number] DOB: 08/01/1927   Cancelled Treatment:    Reason Eval/Treat Not Completed: PT screened, no needs identified, will sign off. Author was called to evaluate this patient 8 weeks ago and performed a screening at that time. Per [facility], the patient is at baseline WC-bound, often able to self-propel. The patient typically self-feeds, but needs help with transfers, and requires/has assistance with all other ADLs/IADLs. Pt is non ambulatory at baseline. Pt will have required level of assist at DC. Pt is at her baseline level of functional independence. Any acute rehabilitative needs can be established as an outpatient after return to facility. PT signing off.   11:00 AM, 08/22/18 Sabrina Burke, PT, DPT Physical Therapist - [facility]  [phone] ([place])    [place] C 08/22/2018, 10:58 AM

## 2018-08-22 NOTE — Progress Notes (Addendum)
Patient ID: Sabrina Burke Mapps, female   DOB: 11/23/1927, 85 y.o.   MRN: [id number]  [facility] PROGRESS NOTE  Sabrina Burke Garczynski HYQ:[id number] DOB: 08/21/1927 DOA: 08/21/2018 PCP: Devoria GlassingLambert, Tracey, NP  HPI/Subjective: Patient feels okay.  Answered no to all questions asked.  Objective: Vitals:   08/22/18 0502 08/22/18 1028  BP: (!) 144/62 140/64  Pulse: 75 71  Resp: 18 18  Temp: 98.6 F (37 C) 98.4 F (36.9 C)  SpO2: 96% 96%    Filed Weights   08/21/18 1957  Weight: 59 kg    ROS: Review of Systems  Constitutional: Negative for chills and fever.  Eyes: Negative for blurred vision.  Respiratory: Negative for cough and shortness of breath.   Cardiovascular: Negative for chest pain.  Gastrointestinal: Negative for abdominal pain, constipation, diarrhea, nausea and vomiting.  Genitourinary: Negative for dysuria.  Musculoskeletal: Negative for joint pain.  Neurological: Negative for dizziness and headaches.   Exam: Physical Exam  HENT:  Nose: No mucosal edema.  Mouth/Throat: No oropharyngeal exudate or posterior oropharyngeal edema.  Eyes: Pupils are equal, round, and reactive to light. Conjunctivae, EOM and lids are normal.  Neck: No JVD present. Carotid bruit is not present. No edema present. No thyroid mass and no thyromegaly present.  Cardiovascular: S1 normal and S2 normal. Exam reveals no gallop.  No murmur heard. Pulses:      Dorsalis pedis pulses are 2+ on the right side and 2+ on the left side.  Respiratory: No respiratory distress. She has no wheezes. She has no rhonchi. She has no rales.  GI: Soft. Bowel sounds are normal. There is no abdominal tenderness.  Musculoskeletal:     Right ankle: She exhibits no swelling.     Left ankle: She exhibits no swelling.  Lymphadenopathy:    She has no cervical adenopathy.  Neurological: She is alert. No cranial nerve deficit.  Skin: Skin is warm. No rash noted. Nails show no clubbing.  Psychiatric: She has a normal mood  and affect.      Data Reviewed: Basic Metabolic Panel: Recent Labs  Lab 08/21/18 1947 08/22/18 0532  NA 137 141  K 4.7 3.9  CL 105 112*  CO2 24 24  GLUCOSE 149* 101*  BUN 39* 33*  CREATININE 1.37* 1.30*  CALCIUM 8.7* 8.2*   Liver Function Tests: Recent Labs  Lab 08/21/18 1947  AST 17  ALT 14  ALKPHOS 55  BILITOT 0.4  PROT 7.1  ALBUMIN 3.0*   CBC: Recent Labs  Lab 08/21/18 1947 08/22/18 0532  WBC 14.5* 10.6*  NEUTROABS 12.5*  --   HGB 10.1* 8.8*  HCT 30.7* 27.2*  MCV 98.1 100.7*  PLT 340 254     Recent Results (from the past 240 hour(s))  Blood Culture (routine x 2)     Status: None (Preliminary result)   Collection Time: 08/21/18  7:47 PM   Specimen: BLOOD  Result Value Ref Range Status   Specimen Description BLOOD LEFT ANTECUBITAL  Final   Special Requests   Final    BOTTLES DRAWN AEROBIC AND ANAEROBIC Blood Culture results may not be optimal due to an excessive volume of blood received in culture bottles   Culture   Final    NO GROWTH < 12 HOURS Performed at [facility], [address] [place], [place] [address]    Report Status PENDING  Incomplete  SARS Coronavirus 2 [facility](Hospital order, Performed in [facility] hospital lab) Nasopharyngeal Nasopharyngeal Swab     Status: None  Collection Time: 08/21/18  7:47 PM   Specimen: Nasopharyngeal Swab  Result Value Ref Range Status   SARS Coronavirus 2 NEGATIVE NEGATIVE Final    Comment: (NOTE) If result is NEGATIVE SARS-CoV-2 target nucleic acids are NOT DETECTED. The SARS-CoV-2 RNA is generally detectable in upper and lower  respiratory specimens during the acute phase of infection. The lowest  concentration of SARS-CoV-2 viral copies this assay can detect is 250  copies / mL. A negative result does not preclude SARS-CoV-2 infection  and should not be used as the sole basis for treatment or other  patient management decisions.  A negative result may occur with  improper specimen collection  / handling, submission of specimen other  than nasopharyngeal swab, presence of viral mutation(s) within the  areas targeted by this assay, and inadequate number of viral copies  (<250 copies / mL). A negative result must be combined with clinical  observations, patient history, and epidemiological information. If result is POSITIVE SARS-CoV-2 target nucleic acids are DETECTED. The SARS-CoV-2 RNA is generally detectable in upper and lower  respiratory specimens dur ing the acute phase of infection.  Positive  results are indicative of active infection with SARS-CoV-2.  Clinical  correlation with patient history and other diagnostic information is  necessary to determine patient infection status.  Positive results do  not rule out bacterial infection or co-infection with other viruses. If result is PRESUMPTIVE POSTIVE SARS-CoV-2 nucleic acids MAY BE PRESENT.   A presumptive positive result was obtained on the submitted specimen  and confirmed on repeat testing.  While 2019 novel coronavirus  (SARS-CoV-2) nucleic acids may be present in the submitted sample  additional confirmatory testing may be necessary for epidemiological  and / or clinical management purposes  to differentiate between  SARS-CoV-2 and other Sarbecovirus currently known to infect humans.  If clinically indicated additional testing with an alternate test  methodology [id number]) is advised. The SARS-CoV-2 RNA is generally  detectable in upper and lower respiratory sp ecimens during the acute  phase of infection. The expected result is Negative. Fact Sheet for Patients:  BoilerBrush.com.cyhttps://www.fda.gov/media/136312/download Fact Sheet for Healthcare Providers: https://pope.com/https://www.fda.gov/media/136313/download This test is not yet approved or cleared by the [place] FDA and has been authorized for detection and/or diagnosis of SARS-CoV-2 by FDA under an Emergency Use Authorization (EUA).  This EUA will remain in effect (meaning this  test can be used) for the duration of the COVID-19 declaration under Section 564(b)(1) of the Act, 21 U.S.C. section 360bbb-3(b)(1), unless the authorization is terminated or revoked sooner. Performed at [facility], [address] [place], [place] [address]   Blood Culture (routine x 2)     Status: None (Preliminary result)   Collection Time: 08/21/18  7:49 PM   Specimen: BLOOD  Result Value Ref Range Status   Specimen Description BLOOD RIGHT HAND  Final   Special Requests   Final    BOTTLES DRAWN AEROBIC AND ANAEROBIC Blood Culture adequate volume   Culture   Final    NO GROWTH < 12 HOURS Performed at [facility], [address] [place], [place] [address]    Report Status PENDING  Incomplete  MRSA PCR Screening     Status: None   Collection Time: 08/22/18  1:52 AM   Specimen: Nasopharyngeal  Result Value Ref Range Status   MRSA by PCR NEGATIVE NEGATIVE Final    Comment:        The GeneXpert MRSA Assay (FDA approved for NASAL specimens  only), is one component of a comprehensive MRSA colonization surveillance program. It is not intended to diagnose MRSA infection nor to guide or monitor treatment for MRSA infections. Performed at [facility], [address] [place], [place] [address]      Scheduled Meds: . amLODipine  10 mg Oral Daily  . aspirin EC  81 mg Oral Daily  . atenolol  12.5 mg Oral Daily  . docusate sodium  100 mg Oral QHS  . enoxaparin (LOVENOX) injection  30 mg Subcutaneous Q24H  . feeding supplement (ENSURE ENLIVE)  237 mL Oral BID BM  . levothyroxine  88 mcg Oral QAC breakfast  . LORazepam  0.25 mg Oral BID  . sodium chloride flush  3 mL Intravenous Q12H   Continuous Infusions: . sodium chloride 30 mL/hr at 08/22/18 1013  . ceFEPime (MAXIPIME) IV 2 g (08/22/18 1014)  . metronidazole 500 mg (08/22/18 1353)    Assessment/Plan:   1. Clinical sepsis likely urine source.  Patient started initially on triple  antibiotics.  I got rid of the vancomycin since MRSA PCR negative.  Follow-up blood cultures and urine cultures. 2. Leukocytosis secondary to sepsis 3. Acute metabolic encephalopathy secondary to sepsis.  Patient answering questions at this time. 4. Essential hypertension on atenolol and Norvasc 5. Hypothyroidism unspecified on levothyroxine 6. Dementia.  As per physical therapy note patient wheelchair-bound.  Code Status:     Code Status Orders  (From admission, onward)         Start     Ordered   08/22/18 0016  Do not attempt resuscitation (DNR)  Continuous    Question Answer Comment  In the event of cardiac or respiratory ARREST Do not call a "code blue"   In the event of cardiac or respiratory ARREST Do not perform Intubation, CPR, defibrillation or ACLS   In the event of cardiac or respiratory ARREST Use medication by any route, position, wound care, and other measures to relive pain and suffering. May use oxygen, suction and manual treatment of airway obstruction as needed for comfort.      08/22/18 0015        Code Status History    Date Active Date Inactive Code Status Order ID Comments User Context   06/26/2018 0626 06/28/2018 1904 DNR [id number]  Christel Mormon, MD Inpatient   06/26/2018 0412 06/26/2018 0626 Full Code [id number]  Mayer Camel, NP ED   Advance Care Planning Activity     Family Communication: Left message for daughter Disposition Plan: To be determined  Time spent: 36 minutes  [place]

## 2018-08-22 NOTE — H&P (Signed)
[facility] - [place] [facility]   PATIENT NAME: Sabrina Burke    MR#:  [id number]  DATE OF BIRTH:  12/03/1927  DATE OF ADMISSION:  08/21/2018  PRIMARY CARE PHYSICIAN: Devoria GlassingLambert, Tracey, NP   REQUESTING/REFERRING PHYSICIAN: Shaune Pollackameron Isaacs, MD  CHIEF COMPLAINT:   Chief Complaint  Patient presents with  . Fall  . Code Sepsis    HISTORY OF PRESENT ILLNESS:  Sabrina Burke  is a 85 y.o. female with a known history of dementia and hypertension currently a resident of skilled nursing facility.  She presented to the emergency room via EMS services after falling out of her wheelchair reportedly at the skilled nursing facility with continued altered mental status.  Although patient has a history of dementia, this was felt to be abnormal for her.  According to nursing home staff, she has been more agitated over the last 2 days.  On arrival to the emergency room, patient has fever of 100.5 with WBC of 14.  Urinalysis demonstrates large amount leukocytes, bacteria, and greater than 50 WBCs.  She has a history of urinalysis with Klebsiella growth in June 2020.  Patient is a very poor historian and therefore information has been obtained from patient's chart.  He has been admitted to the hospitalist service for further management  PAST MEDICAL HISTORY:   Past Medical History:  Diagnosis Date  . Dementia (HCC)   . Hypertension     PAST SURGICAL HISTORY:  No past surgical history on file.  SOCIAL HISTORY:   Social History   Tobacco Use  . Smoking status: Never Smoker  . Smokeless tobacco: Never Used  . Tobacco comment: pt with advance dementia  Substance Use Topics  . Alcohol use: Not Currently    FAMILY HISTORY:  No family history on file.  DRUG ALLERGIES:  No Known Allergies  REVIEW OF SYSTEMS:   ROS Unable to be performed given patient's altered mental status   MEDICATIONS AT HOME:   Prior to Admission medications   Medication Sig Start Date End  Date Taking? Authorizing Provider  acetaminophen (TYLENOL) 325 MG tablet Take 650 mg by mouth every 6 (six) hours as needed for mild pain or fever.   Yes [provider]  alum & mag hydroxide-simeth (MAALOX/MYLANTA) 200-200-20 MG/5ML suspension Take 30 mLs by mouth as needed for indigestion or heartburn.   Yes [provider]  amLODipine (NORVASC) 10 MG tablet Take 1 tablet (10 mg total) by mouth daily. 06/28/18  Yes Wieting, Richard, MD  aspirin EC 81 MG EC tablet Take 1 tablet (81 mg total) by mouth daily. 06/28/18  Yes Wieting, Richard, MD  atenolol (TENORMIN) 25 MG tablet Take 0.5 tablets (12.5 mg total) by mouth daily. 06/28/18  Yes Wieting, Richard, MD  bisacodyl (DULCOLAX) 5 MG EC tablet Take 5 mg by mouth daily as needed for moderate constipation.   Yes [provider]  cholecalciferol (VITAMIN D) 1000 units tablet Take 2,000 Units by mouth daily.    Yes [provider]  docusate sodium (COLACE) 100 MG capsule Take 100 mg by mouth at bedtime.   Yes [provider]  feeding supplement, ENSURE ENLIVE, (ENSURE ENLIVE) LIQD Take 237 mLs by mouth 2 (two) times daily between meals. 06/28/18  Yes Wieting, Richard, MD  furosemide (LASIX) 20 MG tablet Take 20 mg by mouth daily.   Yes [provider]  guaiFENesin (ROBITUSSIN) 100 MG/5ML liquid Take 200 mg by mouth 3 (three) times daily as needed for cough.  Yes [provider]  levothyroxine (SYNTHROID) 88 MCG tablet Take 88 mcg by mouth daily before breakfast.    Yes [provider]  loperamide (IMODIUM) 2 MG capsule Take 2 mg by mouth as needed for diarrhea or loose stools.   Yes [provider]  LORazepam (ATIVAN) 0.5 MG tablet Take 0.5 tablets (0.25 mg total) by mouth 2 (two) times daily. Takes at 1400 and 2100 06/28/18  Yes Wieting, Richard, MD  magnesium hydroxide (MILK OF MAGNESIA) 400 MG/5ML suspension Take 30 mLs by mouth daily as needed for mild constipation.   Yes  [provider]  Multiple Vitamin (DAILY VITE PO) Take 1 tablet by mouth daily.   Yes [provider]  neomycin-bacitracin-polymyxin (NEOSPORIN) ointment Apply 1 application topically as needed for wound care.   Yes [provider]      VITAL SIGNS:  Blood pressure (!) 144/62, pulse 75, temperature 98.6 F (37 C), temperature source Axillary, resp. rate 18, weight 59 kg, SpO2 96 %.  PHYSICAL EXAMINATION:  Physical Exam  GENERAL:  85 y.o.-year-old ill-appearing patient lying in the bed with no acute distress.  EYES: Pupils equal, round, reactive to light and accommodation. No scleral icterus. Extraocular muscles intact.  HEENT: Head atraumatic, normocephalic. Oropharynx and nasopharynx clear.  NECK:  Supple, no jugular venous distention. No thyroid enlargement, no tenderness.  LUNGS: Normal breath sounds bilaterally, no wheezing, rales,rhonchi or crepitation. No use of accessory muscles of respiration.  CARDIOVASCULAR: Regular rate and rhythm, S1, S2 normal. No murmurs, rubs, or gallops.  ABDOMEN: Soft, nondistended, nontender. Bowel sounds present. No organomegaly or mass.  EXTREMITIES: No pedal edema, cyanosis, or clubbing.  NEUROLOGIC: Patient is alert and disoriented  pSYCHIATRIC: Alert yet confused and disoriented SKIN: No obvious rash, lesion, or ulcer.   LABORATORY PANEL:   CBC Recent Labs  Lab 08/22/18 0532  WBC 10.6*  HGB 8.8*  HCT 27.2*  PLT 254   ------------------------------------------------------------------------------------------------------------------  Chemistries  Recent Labs  Lab 08/21/18 1947 08/22/18 0532  NA 137 141  K 4.7 3.9  CL 105 112*  CO2 24 24  GLUCOSE 149* 101*  BUN 39* 33*  CREATININE 1.37* 1.30*  CALCIUM 8.7* 8.2*  AST 17  --   ALT 14  --   ALKPHOS 55  --   BILITOT 0.4  --    ------------------------------------------------------------------------------------------------------------------  Cardiac  Enzymes No results for input(s): TROPONINI in the last 168 hours. ------------------------------------------------------------------------------------------------------------------  RADIOLOGY:  No results found.    IMPRESSION AND PLAN:   1. sepsis - Patient started on broad-spectrum IV antibiotic therapy with cefepime, Flagyl, and vancomycin - Likely secondary to urinary tract infection - She received IV fluid bolus on arrival currently with normal saline infusing to peripheral IV at 125 cc/h - Repeat BMP and CBC in the a.m. - Blood and urine cultures are pending  2.  Urinary tract infection - Broad-spectrum IV antibiotic therapy initiated - Will adjust therapy as indicated when urine cultures are available  3.  Leukocytosis - Likely secondary to  urinary tract infection - Patient is being treated with IV antibiotic therapy -Will monitor repeated CBC  4.  Altered mental status - Likely secondary to urinary tract infection with sepsis --fall precautions   DVT and PPI prophylaxis  All the records are reviewed and case discussed with ED provider. The plan of care was discussed in details with the patient (and family). I answered all questions. The patient agreed to proceed with the above mentioned plan. Further management  will depend upon hospital course.   CODE STATUS: Full code  TOTAL TIME TAKING CARE OF THIS PATIENT: 40minutes.    [place] on 08/22/2018 at 6:32 AM  Pager - [phone]  After 6pm go to [email or web address] - Proofreader  [facility]  Office  [phone]  CC: Primary care physician; Odessa Fleming, NP   Note: This dictation was prepared with Dragon dictation along with smaller phrase technology. Any transcriptional errors that result from this process are unintentional.

## 2018-08-23 LAB — BASIC METABOLIC PANEL
Anion gap: 5 (ref 5–15)
BUN: 31 mg/dL — ABNORMAL HIGH (ref 8–23)
CO2: 25 mmol/L (ref 22–32)
Calcium: 8.3 mg/dL — ABNORMAL LOW (ref 8.9–10.3)
Chloride: 112 mmol/L — ABNORMAL HIGH (ref 98–111)
Creatinine, Ser: 1.28 mg/dL — ABNORMAL HIGH (ref 0.44–1.00)
GFR calc Af Amer: 43 mL/min — ABNORMAL LOW (ref >60)
GFR calc non Af Amer: 37 mL/min — ABNORMAL LOW (ref >60)
Glucose, Bld: 88 mg/dL (ref 70–99)
Potassium: 3.5 mmol/L (ref 3.5–5.1)
Sodium: 142 mmol/L (ref 135–145)

## 2018-08-23 MED ORDER — CHLORHEXIDINE GLUCONATE 0.12 % MT SOLN
15.0000 mL | Freq: Two times a day (BID) | OROMUCOSAL | Status: DC
  Administered 2018-08-23 (×2): 15 mL via OROMUCOSAL
  Filled 2018-08-23: qty 15

## 2018-08-23 NOTE — Progress Notes (Signed)
Patient ID: Rifka Ramey, female   DOB: Feb 27, 1927, 85 y.o.   MRN: [id number]  [facility] PROGRESS NOTE  Melvie Paglia BOF:[id number] DOB: [birth year] DOA: 08/21/2018 PCP: Odessa Fleming, NP  HPI/Subjective: Patient feels okay.  Answers no to every question I asked her.  States she needs to be fed.  Objective: Vitals:   08/22/18 2040 08/23/18 0431  BP: (!) 142/74 128/76  Pulse: 67 (!) 58  Resp: 16 18  Temp: 99.5 F (37.5 C) 98.3 F (36.8 C)  SpO2: 98% 98%    Filed Weights   08/21/18 1957  Weight: 59 kg    ROS: Review of Systems  Constitutional: Negative for chills and fever.  Eyes: Negative for blurred vision.  Respiratory: Negative for cough and shortness of breath.   Cardiovascular: Negative for chest pain.  Gastrointestinal: Negative for abdominal pain, constipation, diarrhea, nausea and vomiting.  Genitourinary: Negative for dysuria.  Musculoskeletal: Negative for joint pain.  Neurological: Negative for dizziness and headaches.   Exam: Physical Exam  HENT:  Nose: No mucosal edema.  Mouth/Throat: No oropharyngeal exudate or posterior oropharyngeal edema.  Eyes: Pupils are equal, round, and reactive to light. Conjunctivae, EOM and lids are normal.  Neck: No JVD present. Carotid bruit is not present. No edema present. No thyroid mass and no thyromegaly present.  Cardiovascular: S1 normal and S2 normal. Exam reveals no gallop.  No murmur heard. Pulses:      Dorsalis pedis pulses are 2+ on the right side and 2+ on the left side.  Respiratory: No respiratory distress. She has no wheezes. She has no rhonchi. She has no rales.  GI: Soft. Bowel sounds are normal. There is no abdominal tenderness.  Musculoskeletal:     Right ankle: She exhibits no swelling.     Left ankle: She exhibits no swelling.  Lymphadenopathy:    She has no cervical adenopathy.  Neurological: She is alert. No cranial nerve deficit.  Skin: Skin is warm. No rash noted. Nails show no  clubbing.  Psychiatric: She has a normal mood and affect.      Data Reviewed: Basic Metabolic Panel: Recent Labs  Lab 08/21/18 1947 08/22/18 0532 08/23/18 0448  NA 137 141 142  K 4.7 3.9 3.5  CL 105 112* 112*  CO2 24 24 25   GLUCOSE 149* 101* 88  BUN 39* 33* 31*  CREATININE 1.37* 1.30* 1.28*  CALCIUM 8.7* 8.2* 8.3*   Liver Function Tests: Recent Labs  Lab 08/21/18 1947  AST 17  ALT 14  ALKPHOS 55  BILITOT 0.4  PROT 7.1  ALBUMIN 3.0*   CBC: Recent Labs  Lab 08/21/18 1947 08/22/18 0532  WBC 14.5* 10.6*  NEUTROABS 12.5*  --   HGB 10.1* 8.8*  HCT 30.7* 27.2*  MCV 98.1 100.7*  PLT 340 254     Recent Results (from the past 240 hour(s))  Blood Culture (routine x 2)     Status: None (Preliminary result)   Collection Time: 08/21/18  7:47 PM   Specimen: BLOOD  Result Value Ref Range Status   Specimen Description BLOOD LEFT ANTECUBITAL  Final   Special Requests   Final    BOTTLES DRAWN AEROBIC AND ANAEROBIC Blood Culture results may not be optimal due to an excessive volume of blood received in culture bottles   Culture   Final    NO GROWTH 2 DAYS Performed at [facility], [address], [place], [place] [address]    Report Status PENDING  Incomplete  SARS Coronavirus  2 [facility](Hospital order, Performed in [facility] hospital lab) Nasopharyngeal Nasopharyngeal Swab     Status: None   Collection Time: 08/21/18  7:47 PM   Specimen: Nasopharyngeal Swab  Result Value Ref Range Status   SARS Coronavirus 2 NEGATIVE NEGATIVE Final    Comment: (NOTE) If result is NEGATIVE SARS-CoV-2 target nucleic acids are NOT DETECTED. The SARS-CoV-2 RNA is generally detectable in upper and lower  respiratory specimens during the acute phase of infection. The lowest  concentration of SARS-CoV-2 viral copies this assay can detect is 250  copies / mL. A negative result does not preclude SARS-CoV-2 infection  and should not be used as the sole basis for treatment or other   patient management decisions.  A negative result may occur with  improper specimen collection / handling, submission of specimen other  than nasopharyngeal swab, presence of viral mutation(s) within the  areas targeted by this assay, and inadequate number of viral copies  (<250 copies / mL). A negative result must be combined with clinical  observations, patient history, and epidemiological information. If result is POSITIVE SARS-CoV-2 target nucleic acids are DETECTED. The SARS-CoV-2 RNA is generally detectable in upper and lower  respiratory specimens dur ing the acute phase of infection.  Positive  results are indicative of active infection with SARS-CoV-2.  Clinical  correlation with patient history and other diagnostic information is  necessary to determine patient infection status.  Positive results do  not rule out bacterial infection or co-infection with other viruses. If result is PRESUMPTIVE POSTIVE SARS-CoV-2 nucleic acids MAY BE PRESENT.   A presumptive positive result was obtained on the submitted specimen  and confirmed on repeat testing.  While 2019 novel coronavirus  (SARS-CoV-2) nucleic acids may be present in the submitted sample  additional confirmatory testing may be necessary for epidemiological  and / or clinical management purposes  to differentiate between  SARS-CoV-2 and other Sarbecovirus currently known to infect humans.  If clinically indicated additional testing with an alternate test  methodology [phone]([id number]) is advised. The SARS-CoV-2 RNA is generally  detectable in upper and lower respiratory sp ecimens during the acute  phase of infection. The expected result is Negative. Fact Sheet for Patients:  BoilerBrush.com.cyhttps://www.fda.gov/media/136312/download Fact Sheet for Healthcare Providers: https://pope.com/https://www.fda.gov/media/136313/download This test is not yet approved or cleared by the [place] FDA and has been authorized for detection and/or diagnosis of SARS-CoV-2  by FDA under an Emergency Use Authorization (EUA).  This EUA will remain in effect (meaning this test can be used) for the duration of the COVID-19 declaration under Section 564(b)(1) of the Act, 21 U.S.C. section 360bbb-3(b)(1), unless the authorization is terminated or revoked sooner. Performed at [facility], [address] [place], [place] [address]   Blood Culture (routine x 2)     Status: None (Preliminary result)   Collection Time: 08/21/18  7:49 PM   Specimen: BLOOD  Result Value Ref Range Status   Specimen Description BLOOD RIGHT HAND  Final   Special Requests   Final    BOTTLES DRAWN AEROBIC AND ANAEROBIC Blood Culture adequate volume   Culture   Final    NO GROWTH 2 DAYS Performed at [facility], [address] [place], [place] [address]    Report Status PENDING  Incomplete  Urine culture     Status: Abnormal (Preliminary result)   Collection Time: 08/21/18  7:50 PM   Specimen: In/Out Cath Urine  Result Value Ref Range Status   Specimen Description  Final    IN/OUT CATH URINE Performed at [facility], [address] [place], [place] [address]    Special Requests   Final    NONE Performed at [facility], [address] [place], [place] [address]    Culture >=100,000 COLONIES/mL KLEBSIELLA PNEUMONIAE (A)  Final   Report Status PENDING  Incomplete  MRSA PCR Screening     Status: None   Collection Time: 08/22/18  1:52 AM   Specimen: Nasopharyngeal  Result Value Ref Range Status   MRSA by PCR NEGATIVE NEGATIVE Final    Comment:        The GeneXpert MRSA Assay (FDA approved for NASAL specimens only), is one component of a comprehensive MRSA colonization surveillance program. It is not intended to diagnose MRSA infection nor to guide or monitor treatment for MRSA infections. Performed at [facility], [address] [place], [place] [address]      Scheduled Meds: . amLODipine  10 mg Oral Daily  .  aspirin EC  81 mg Oral Daily  . atenolol  12.5 mg Oral Daily  . chlorhexidine  15 mL Mouth/Throat BID  . docusate sodium  100 mg Oral QHS  . enoxaparin (LOVENOX) injection  30 mg Subcutaneous Q24H  . feeding supplement (ENSURE ENLIVE)  237 mL Oral BID BM  . levothyroxine  88 mcg Oral QAC breakfast  . LORazepam  0.25 mg Oral BID  . sodium chloride flush  3 mL Intravenous Q12H   Continuous Infusions: . sodium chloride 30 mL/hr at 08/22/18 2051  . ceFEPime (MAXIPIME) IV 2 g (08/23/18 0910)    Assessment/Plan:   1. Clinical sepsis likely secondary to urinary tract infection.  Klebsiella growing out of the urine culture but sensitivities are still pending.  Continue antibiotics and fine-tune antibiotics once sensitivities are back. 2. Leukocytosis secondary to sepsis 3. Acute metabolic encephalopathy secondary to sepsis.  Patient answering questions at this time. 4. Essential hypertension on atenolol and Norvasc 5. Hypothyroidism unspecified on levothyroxine 6. Dementia.  As per physical therapy note patient wheelchair-bound.  Code Status:     Code Status Orders  (From admission, onward)         Start     Ordered   08/22/18 0016  Do not attempt resuscitation (DNR)  Continuous    Question Answer Comment  In the event of cardiac or respiratory ARREST Do not call a "code blue"   In the event of cardiac or respiratory ARREST Do not perform Intubation, CPR, defibrillation or ACLS   In the event of cardiac or respiratory ARREST Use medication by any route, position, wound care, and other measures to relive pain and suffering. May use oxygen, suction and manual treatment of airway obstruction as needed for comfort.      08/22/18 0015        Code Status History    Date Active Date Inactive Code Status Order ID Comments User Context   06/26/2018 0626 06/28/2018 1904 DNR [id number]  Hannah BeatMansy, Jan A, MD Inpatient   06/26/2018 [phone] 06/26/2018 0626 Full Code [id number]  Pearletha AlfredSeals, Angela H, NP ED    Advance Care Planning Activity     Family Communication: Spoke with daughter on the phone Disposition Plan: Likely back to her facility tomorrow  Time spent: 28 minutes  Carlynn Leduc [employer]  [employer]

## 2018-08-24 LAB — BASIC METABOLIC PANEL
Anion gap: 7 (ref 5–15)
BUN: 25 mg/dL — ABNORMAL HIGH (ref 8–23)
CO2: 25 mmol/L (ref 22–32)
Calcium: 8.4 mg/dL — ABNORMAL LOW (ref 8.9–10.3)
Chloride: 109 mmol/L (ref 98–111)
Creatinine, Ser: 1.1 mg/dL — ABNORMAL HIGH (ref 0.44–1.00)
GFR calc Af Amer: 52 mL/min — ABNORMAL LOW (ref >60)
GFR calc non Af Amer: 44 mL/min — ABNORMAL LOW (ref >60)
Glucose, Bld: 95 mg/dL (ref 70–99)
Potassium: 3.3 mmol/L — ABNORMAL LOW (ref 3.5–5.1)
Sodium: 141 mmol/L (ref 135–145)

## 2018-08-24 LAB — URINE CULTURE: Culture: >=100000 — AB

## 2018-08-24 LAB — CBC
HCT: 29.2 % — ABNORMAL LOW (ref 36.0–46.0)
Hemoglobin: 9.6 g/dL — ABNORMAL LOW (ref 12.0–15.0)
MCH: 33 pg (ref 26.0–34.0)
MCHC: 32.9 g/dL (ref 30.0–36.0)
MCV: 100.3 fL — ABNORMAL HIGH (ref 80.0–100.0)
Platelets: 265 10*3/uL (ref 150–400)
RBC: 2.91 MIL/uL — ABNORMAL LOW (ref 3.87–5.11)
RDW: 12.5 % (ref 11.5–15.5)
WBC: 6 10*3/uL (ref 4.0–10.5)
nRBC: 0 % (ref 0.0–0.2)

## 2018-08-24 LAB — SARS CORONAVIRUS 2 BY RT PCR (HOSPITAL ORDER, PERFORMED IN ~~LOC~~ HOSPITAL LAB): SARS Coronavirus 2: NEGATIVE

## 2018-08-24 MED ORDER — POTASSIUM CHLORIDE 20 MEQ PO PACK
20.0000 meq | PACK | Freq: Every day | ORAL | Status: DC
  Administered 2018-08-24: 20 meq via ORAL
  Filled 2018-08-24: qty 1

## 2018-08-24 MED ORDER — POTASSIUM CHLORIDE 20 MEQ PO PACK: 20.0000 meq | PACK | Freq: Every day | ORAL | 0 refills | Status: DC

## 2018-08-24 MED ORDER — LORAZEPAM 0.5 MG PO TABS: 0.2500 mg | ORAL_TABLET | Freq: Two times a day (BID) | ORAL | 0 refills | Status: DC

## 2018-08-24 MED ORDER — CEPHALEXIN 250 MG PO CAPS: 250.0000 mg | ORAL_CAPSULE | Freq: Three times a day (TID) | ORAL | 0 refills | Status: DC

## 2018-08-24 MED ORDER — CEPHALEXIN 250 MG PO CAPS
250.0000 mg | ORAL_CAPSULE | Freq: Three times a day (TID) | ORAL | Status: DC
  Administered 2018-08-24: 09:00:00 250 mg via ORAL
  Filled 2018-08-24 (×3): qty 1

## 2018-08-24 NOTE — TOC Transition Note (Signed)
Transition of Care [facility]) - CM/SW Discharge Note   Patient Details  Name: Sabrina Burke MRN: [id number] Date of Birth: [birth year]  Transition of [facility] CM/SW Contact:  Shelbie Hutching, RN Phone Number: 08/24/2018, 9:28 AM   Clinical Narrative:    Patient will discharge back to [facility] today.  Daughter Modena Nunnery and [employer] both notified of discharge.  Patient will transport via EMS.  Repeat Covid will be completed before discharge.  RN will call report to [phone].   Final next level of care: Assisted Living Barriers to Discharge: Barriers Resolved   Patient Goals and CMS Choice        Discharge Placement              Patient chooses bed at: [facility]) Patient to be transferred to facility by: [facility] Name of family member notified: Einar Crow - daughter Patient and family notified of of transfer: 08/24/18  Discharge Plan and Services                                     Social Determinants of Health (SDOH) Interventions     Readmission Risk Interventions No flowsheet data found.

## 2018-08-24 NOTE — Progress Notes (Signed)
Pt being discharged back to [facility] today, report given to [place] at facility, EMS to transport, pt with no complaints

## 2018-08-24 NOTE — Discharge Summary (Signed)
[facility] - [place] at [facility]   PATIENT NAME: Sabrina Burke    MR#:  [id number]  DATE OF BIRTH:  06/21/1927  DATE OF ADMISSION:  08/21/2018 ADMITTING PHYSICIAN: Hannah BeatJan A Mansy, MD  DATE OF DISCHARGE: 08/24/2018  PRIMARY CARE PHYSICIAN: Devoria GlassingLambert, Tracey, NP    ADMISSION DIAGNOSIS:  Sepsis secondary to UTI (HCC) [A41.9, N39.0]  DISCHARGE DIAGNOSIS:  Active Problems:   Sepsis (HCC)   SECONDARY DIAGNOSIS:   Past Medical History:  Diagnosis Date  . Dementia (HCC)   . Hypertension     HOSPITAL COURSE:   1.  Clinical sepsis secondary to urinary tract infection.  Klebsiella growing out of the urine cultures and it is sensitive to oral agents.  The patient was given aggressive antibiotics with Maxipime initially and now switched over to p.o. Keflex for a few more days upon going home 2.  Leukocytosis secondary to sepsis.  White blood cell count normalized 3.  Acute metabolic encephalopathy secondary to sepsis.  Patient answering questions at this time 4.  Essential hypertension on atenolol Norvasc 5.  Hypothyroidism.  Continue levothyroxine 6.  Dementia.  As per physical therapy patient is wheelchair-bound at baseline.  Patient to go back to memory unit today. 7.  Anemia.  Hemoglobin actually up at 9.6 today 8.  Chronic kidney disease stage III.  Continue to watch closely for dehydration signs.  Careful with Lasix.  I held the Lasix while here in the hospital and gave IV fluids and creatinine improved to 1.1  DISCHARGE CONDITIONS:   Satisfactory  CONSULTS OBTAINED:  None  DRUG ALLERGIES:  No Known Allergies  DISCHARGE MEDICATIONS:   Allergies as of 08/24/2018   No Known Allergies     Medication List    TAKE these medications   acetaminophen 325 MG tablet Commonly known as: TYLENOL Take 650 mg by mouth every 6 (six) hours as needed for mild pain or fever.   alum & mag hydroxide-simeth 200-200-20 MG/5ML suspension Commonly known as:  MAALOX/MYLANTA Take 30 mLs by mouth as needed for indigestion or heartburn.   amLODipine 10 MG tablet Commonly known as: NORVASC Take 1 tablet (10 mg total) by mouth daily.   aspirin 81 MG EC tablet Take 1 tablet (81 mg total) by mouth daily.   atenolol 25 MG tablet Commonly known as: TENORMIN Take 0.5 tablets (12.5 mg total) by mouth daily.   bisacodyl 5 MG EC tablet Commonly known as: DULCOLAX Take 5 mg by mouth daily as needed for moderate constipation.   cephALEXin 250 MG capsule Commonly known as: KEFLEX Take 1 capsule (250 mg total) by mouth every 8 (eight) hours.   cholecalciferol 25 MCG (1000 UT) tablet Commonly known as: VITAMIN D Take 2,000 Units by mouth daily.   DAILY VITE PO Take 1 tablet by mouth daily.   docusate sodium 100 MG capsule Commonly known as: COLACE Take 100 mg by mouth at bedtime.   feeding supplement (ENSURE ENLIVE) Liqd Take 237 mLs by mouth 2 (two) times daily between meals.   furosemide 20 MG tablet Commonly known as: LASIX Take 20 mg by mouth daily.   guaiFENesin 100 MG/5ML liquid Commonly known as: ROBITUSSIN Take 200 mg by mouth 3 (three) times daily as needed for cough.   levothyroxine 88 MCG tablet Commonly known as: SYNTHROID Take 88 mcg by mouth daily before breakfast.   loperamide 2 MG capsule Commonly known as: IMODIUM Take 2 mg by mouth as needed for diarrhea or loose stools.  LORazepam 0.5 MG tablet Commonly known as: ATIVAN Take 0.5 tablets (0.25 mg total) by mouth 2 (two) times daily. Takes at 1400 and 2100   magnesium hydroxide 400 MG/5ML suspension Commonly known as: MILK OF MAGNESIA Take 30 mLs by mouth daily as needed for mild constipation.   neomycin-bacitracin-polymyxin ointment Commonly known as: NEOSPORIN Apply 1 application topically as needed for wound care.   potassium chloride 20 MEQ packet Commonly known as: KLOR-CON Take 20 mEq by mouth daily.        DISCHARGE INSTRUCTIONS:   Follow-up  with Dr. at facility 1 day  If you experience worsening of your admission symptoms, develop shortness of breath, life threatening emergency, suicidal or homicidal thoughts you must seek medical attention immediately by calling 911 or calling your MD immediately  if symptoms less severe.  You Must read complete instructions/literature along with all the possible adverse reactions/side effects for all the Medicines you take and that have been prescribed to you. Take any new Medicines after you have completely understood and accept all the possible adverse reactions/side effects.   Please note  You were cared for by a hospitalist during your hospital stay. If you have any questions about your discharge medications or the care you received while you were in the hospital after you are discharged, you can call the unit and asked to speak with the hospitalist on call if the hospitalist that took care of you is not available. Once you are discharged, your primary care physician will handle any further medical issues. Please note that NO REFILLS for any discharge medications will be authorized once you are discharged, as it is imperative that you return to your primary care physician (or establish a relationship with a primary care physician if you do not have one) for your aftercare needs so that they can reassess your need for medications and monitor your lab values.    Today   CHIEF COMPLAINT:   Chief Complaint  Patient presents with  . Fall  . Code Sepsis    HISTORY OF PRESENT ILLNESS:  Sabrina Burke Sabrina Burke  is a 85 y.o. female came in with altered mental status found to have sepsis   VITAL SIGNS:  Blood pressure (!) 166/66, pulse (!) 58, temperature 98 F (36.7 C), resp. rate 18, weight 59 kg, SpO2 96 %.   PHYSICAL EXAMINATION:  GENERAL:  85 y.o.-year-old patient lying in the bed with no acute distress.  EYES: Pupils equal, round, reactive to light and accommodation. No scleral icterus.   HEENT: Head atraumatic, normocephalic. Oropharynx and nasopharynx clear.  NECK:  Supple, no jugular venous distention. No thyroid enlargement, no tenderness.  LUNGS: Normal breath sounds bilaterally, no wheezing, rales,rhonchi or crepitation. No use of accessory muscles of respiration.  CARDIOVASCULAR: S1, S2 normal. No murmurs, rubs, or gallops.  ABDOMEN: Soft, non-tender, non-distended. Bowel sounds present. No organomegaly or mass.  EXTREMITIES: 2+ pedal edema, no cyanosis, or clubbing.  NEUROLOGIC: Cranial nerves II through XII are intact. Muscle strength 5/5 in all extremities. Sensation intact. Gait not checked.  PSYCHIATRIC: The patient is alert and answers yes or no questions.  SKIN: No obvious rash, lesion, or ulcer.   DATA REVIEW:   CBC Recent Labs  Lab 08/24/18 0531  WBC 6.0  HGB 9.6*  HCT 29.2*  PLT 265    Chemistries  Recent Labs  Lab 08/21/18 1947  08/24/18 0531  NA 137   < > 141  K 4.7   < > 3.3*  CL  105   < > 109  CO2 24   < > 25  GLUCOSE 149*   < > 95  BUN 39*   < > 25*  CREATININE 1.37*   < > 1.10*  CALCIUM 8.7*   < > 8.4*  AST 17  --   --   ALT 14  --   --   ALKPHOS 55  --   --   BILITOT 0.4  --   --    < > = values in this interval not displayed.      Microbiology Results  Results for orders placed or performed during the hospital encounter of 08/21/18  Blood Culture (routine x 2)     Status: None (Preliminary result)   Collection Time: 08/21/18  7:47 PM   Specimen: BLOOD  Result Value Ref Range Status   Specimen Description BLOOD LEFT ANTECUBITAL  Final   Special Requests   Final    BOTTLES DRAWN AEROBIC AND ANAEROBIC Blood Culture results may not be optimal due to an excessive volume of blood received in culture bottles   Culture   Final    NO GROWTH 3 DAYS Performed at [facility], [address], [place], [place] [address]    Report Status PENDING  Incomplete  SARS Coronavirus 2 [facility] order, Performed in [facility]  hospital lab) Nasopharyngeal Nasopharyngeal Swab     Status: None   Collection Time: 08/21/18  7:47 PM   Specimen: Nasopharyngeal Swab  Result Value Ref Range Status   SARS Coronavirus 2 NEGATIVE NEGATIVE Final    Comment: (NOTE) If result is NEGATIVE SARS-CoV-2 target nucleic acids are NOT DETECTED. The SARS-CoV-2 RNA is generally detectable in upper and lower  respiratory specimens during the acute phase of infection. The lowest  concentration of SARS-CoV-2 viral copies this assay can detect is 250  copies / mL. A negative result does not preclude SARS-CoV-2 infection  and should not be used as the sole basis for treatment or other  patient management decisions.  A negative result may occur with  improper specimen collection / handling, submission of specimen other  than nasopharyngeal swab, presence of viral mutation(s) within the  areas targeted by this assay, and inadequate number of viral copies  (<250 copies / mL). A negative result must be combined with clinical  observations, patient history, and epidemiological information. If result is POSITIVE SARS-CoV-2 target nucleic acids are DETECTED. The SARS-CoV-2 RNA is generally detectable in upper and lower  respiratory specimens dur ing the acute phase of infection.  Positive  results are indicative of active infection with SARS-CoV-2.  Clinical  correlation with patient history and other diagnostic information is  necessary to determine patient infection status.  Positive results do  not rule out bacterial infection or co-infection with other viruses. If result is PRESUMPTIVE POSTIVE SARS-CoV-2 nucleic acids MAY BE PRESENT.   A presumptive positive result was obtained on the submitted specimen  and confirmed on repeat testing.  While 2019 novel coronavirus  (SARS-CoV-2) nucleic acids may be present in the submitted sample  additional confirmatory testing may be necessary for epidemiological  and / or clinical management  purposes  to differentiate between  SARS-CoV-2 and other Sarbecovirus currently known to infect humans.  If clinically indicated additional testing with an alternate test  methodology [id number]) is advised. The SARS-CoV-2 RNA is generally  detectable in upper and lower respiratory sp ecimens during the acute  phase of infection. The expected result is Negative. Fact  Sheet for Patients:  BoilerBrush.com.cyhttps://www.fda.gov/media/136312/download Fact Sheet for Healthcare Providers: https://pope.com/https://www.fda.gov/media/136313/download This test is not yet approved or cleared by the [place] FDA and has been authorized for detection and/or diagnosis of SARS-CoV-2 by FDA under an Emergency Use Authorization (EUA).  This EUA will remain in effect (meaning this test can be used) for the duration of the COVID-19 declaration under Section 564(b)(1) of the Act, 21 U.S.C. section 360bbb-3(b)(1), unless the authorization is terminated or revoked sooner. Performed at [facility], [address] [place], [place] [address]   Blood Culture (routine x 2)     Status: None (Preliminary result)   Collection Time: 08/21/18  7:49 PM   Specimen: BLOOD  Result Value Ref Range Status   Specimen Description BLOOD RIGHT HAND  Final   Special Requests   Final    BOTTLES DRAWN AEROBIC AND ANAEROBIC Blood Culture adequate volume   Culture   Final    NO GROWTH 3 DAYS Performed at [facility], [address] [place], [place] [address]    Report Status PENDING  Incomplete  Urine culture     Status: Abnormal   Collection Time: 08/21/18  7:50 PM   Specimen: In/Out Cath Urine  Result Value Ref Range Status   Specimen Description   Final    IN/OUT CATH URINE Performed at [facility], [address] [place], [place] [address]    Special Requests   Final    NONE Performed at [facility], [address] [place], [place] [address]    Culture >=100,000 COLONIES/mL KLEBSIELLA PNEUMONIAE  (A)  Final   Report Status 08/24/2018 FINAL  Final   Organism ID, Bacteria KLEBSIELLA PNEUMONIAE (A)  Final      Susceptibility   Klebsiella pneumoniae - MIC*    AMPICILLIN RESISTANT Resistant     CEFAZOLIN <=4 SENSITIVE Sensitive     CEFTRIAXONE <=1 SENSITIVE Sensitive     CIPROFLOXACIN <=0.25 SENSITIVE Sensitive     GENTAMICIN <=1 SENSITIVE Sensitive     IMIPENEM <=0.25 SENSITIVE Sensitive     NITROFURANTOIN 64 INTERMEDIATE Intermediate     TRIMETH/SULFA <=20 SENSITIVE Sensitive     AMPICILLIN/SULBACTAM <=2 SENSITIVE Sensitive     PIP/TAZO <=4 SENSITIVE Sensitive     Extended ESBL NEGATIVE Sensitive     * >=100,000 COLONIES/mL KLEBSIELLA PNEUMONIAE  MRSA PCR Screening     Status: None   Collection Time: 08/22/18  1:52 AM   Specimen: Nasopharyngeal  Result Value Ref Range Status   MRSA by PCR NEGATIVE NEGATIVE Final    Comment:        The GeneXpert MRSA Assay (FDA approved for NASAL specimens only), is one component of a comprehensive MRSA colonization surveillance program. It is not intended to diagnose MRSA infection nor to guide or monitor treatment for MRSA infections. Performed at [facility], [address] [place], [place] [address]      Management plans discussed with the patient, family and they are in agreement.  CODE STATUS:     Code Status Orders  (From admission, onward)         Start     Ordered   08/22/18 0016  Do not attempt resuscitation (DNR)  Continuous    Question Answer Comment  In the event of cardiac or respiratory ARREST Do not call a "code blue"   In the event of cardiac or respiratory ARREST Do not perform Intubation, CPR, defibrillation or ACLS   In the event of cardiac  or respiratory ARREST Use medication by any route, position, wound care, and other measures to relive pain and suffering. May use oxygen, suction and manual treatment of airway obstruction as needed for comfort.      08/22/18 0015        Code Status  History    Date Active Date Inactive Code Status Order ID Comments User Context   06/26/2018 0626 06/28/2018 1904 DNR [id number]  Christel Mormon, MD Inpatient   06/26/2018 0412 06/26/2018 0626 Full Code [id number]  Mayer Camel, NP ED   Advance Care Planning Activity      TOTAL TIME TAKING CARE OF THIS PATIENT: 34 minutes.    Loletha Grayer M.D on 08/24/2018 at 8:34 AM  Between 7am to 6pm - Pager - [phone]  After 6pm go to [email or web address] - password [email or web address] [facility]  [phone]  CC: Primary care physician; Odessa Fleming, NP

## 2018-08-24 NOTE — NC FL2 (Addendum)
[place] LEVEL OF CARE SCREENING TOOL     IDENTIFICATION  Patient Name: Sabrina Burke Birthdate: [birth year] Sex: female Admission Date (Current Location): 08/21/2018  [place] and [place] Number:  Engineering geologist and Address:  [facility], [address] [place], [place] [address]      Provider Number: [id number]  Attending Physician Name and Address:  Loletha Grayer, MD  Relative Name and Phone Number:  Deyana Wnuk [phone]    Current Level of Care: Hospital Recommended Level of Care: Memory Care Prior Approval Number:    Date Approved/Denied:   PASRR Number:    Discharge Plan: Domiciliary (Rest home)(Memory Care)    Current Diagnoses: Patient Active Problem List   Diagnosis Date Noted  . Sepsis ([place]) 06/26/2018    Orientation RESPIRATION BLADDER Height & Weight     Self  Normal Incontinent Weight: 59 kg Height:     BEHAVIORAL SYMPTOMS/MOOD NEUROLOGICAL BOWEL NUTRITION STATUS      Incontinent Diet(heart healthy)  AMBULATORY STATUS COMMUNICATION OF NEEDS Skin   Extensive Assist Verbally Normal                       Personal Care Assistance Level of Assistance  Bathing, Feeding, Dressing Bathing Assistance: Maximum assistance Feeding assistance: Limited assistance Dressing Assistance: Maximum assistance     Functional Limitations Info             SPECIAL CARE FACTORS FREQUENCY                       Contractures Contractures Info: Not present    Additional Factors Info  Code Status, Allergies Code Status Info: DNR Allergies Info: NKA           Current Medications (08/24/2018):  This is the current hospital active medication list Current Facility-Administered Medications  Medication Dose Route Frequency Provider Last Rate Last Dose  . acetaminophen (TYLENOL) tablet 650 mg  650 mg Oral Q6H PRN Seals, Theo Dills, NP       Or  . acetaminophen (TYLENOL) suppository 650 mg  650 mg Rectal  Q6H PRN Seals, Theo Dills, NP      . amLODipine (NORVASC) tablet 10 mg  10 mg Oral Daily Seals, Levada Dy H, NP   10 mg at 08/24/18 0844  . aspirin EC tablet 81 mg  81 mg Oral Daily Mayer Camel, NP   81 mg at 08/24/18 0844  . atenolol (TENORMIN) tablet 12.5 mg  12.5 mg Oral Daily Seals, Angela H, NP   12.5 mg at 08/24/18 0909  . cephALEXin (KEFLEX) capsule 250 mg  250 mg Oral Q8H Loletha Grayer, MD   250 mg at 08/24/18 0909  . chlorhexidine (PERIDEX) 0.12 % solution 15 mL  15 mL Mouth/Throat BID Loletha Grayer, MD   15 mL at 08/23/18 2043  . docusate sodium (COLACE) capsule 100 mg  100 mg Oral QHS Seals, Angela H, NP   100 mg at 08/23/18 2042  . enoxaparin (LOVENOX) injection 30 mg  30 mg Subcutaneous Q24H Seals, Angela H, NP   30 mg at 08/24/18 0844  . feeding supplement (ENSURE ENLIVE) (ENSURE ENLIVE) liquid 237 mL  237 mL Oral BID BM Seals, Angela H, NP   237 mL at 08/24/18 0844  . levothyroxine (SYNTHROID) tablet 88 mcg  88 mcg Oral QAC breakfast Mayer Camel, NP   88 mcg at 08/24/18 0350  . LORazepam (ATIVAN) tablet 0.25 mg  0.25 mg Oral BID Janeann MerlSeals, Angela H, NP   0.25 mg at 08/23/18 2043  . ondansetron (ZOFRAN) tablet 4 mg  4 mg Oral Q6H PRN Seals, Milas KocherAngela H, NP       Or  . ondansetron (ZOFRAN) injection 4 mg  4 mg Intravenous Q6H PRN Seals, Angela H, NP      . polyethylene glycol (MIRALAX / GLYCOLAX) packet 17 g  17 g Oral Daily PRN Seals, Angela H, NP      . potassium chloride (KLOR-CON) packet 20 mEq  20 mEq Oral Daily Alford HighlandWieting, Richard, MD   20 mEq at 08/24/18 0844  . sodium chloride flush (NS) 0.9 % injection 3 mL  3 mL Intravenous Q12H Seals, Milas KocherAngela H, NP   3 mL at 08/24/18 0854     Discharge Medications: TAKE these medications   acetaminophen 325 MG tablet Commonly known as: TYLENOL Take 650 mg by mouth every 6 (six) hours as needed for mild pain or fever.   alum & mag hydroxide-simeth 200-200-20 MG/5ML suspension Commonly known as: MAALOX/MYLANTA Take 30 mLs by mouth as  needed for indigestion or heartburn.   amLODipine 10 MG tablet Commonly known as: NORVASC Take 1 tablet (10 mg total) by mouth daily.   aspirin 81 MG EC tablet Take 1 tablet (81 mg total) by mouth daily.   atenolol 25 MG tablet Commonly known as: TENORMIN Take 0.5 tablets (12.5 mg total) by mouth daily.   bisacodyl 5 MG EC tablet Commonly known as: DULCOLAX Take 5 mg by mouth daily as needed for moderate constipation.   cephALEXin 250 MG capsule Commonly known as: KEFLEX Take 1 capsule (250 mg total) by mouth every 8 (eight) hours.   cholecalciferol 25 MCG (1000 UT) tablet Commonly known as: VITAMIN D Take 2,000 Units by mouth daily.   DAILY VITE PO Take 1 tablet by mouth daily.   docusate sodium 100 MG capsule Commonly known as: COLACE Take 100 mg by mouth at bedtime.   feeding supplement (ENSURE ENLIVE) Liqd Take 237 mLs by mouth 2 (two) times daily between meals.   furosemide 20 MG tablet Commonly known as: LASIX Take 20 mg by mouth daily.   guaiFENesin 100 MG/5ML liquid Commonly known as: ROBITUSSIN Take 200 mg by mouth 3 (three) times daily as needed for cough.   levothyroxine 88 MCG tablet Commonly known as: SYNTHROID Take 88 mcg by mouth daily before breakfast.   loperamide 2 MG capsule Commonly known as: IMODIUM Take 2 mg by mouth as needed for diarrhea or loose stools.   LORazepam 0.5 MG tablet Commonly known as: ATIVAN Take 0.5 tablets (0.25 mg total) by mouth 2 (two) times daily. Takes at 1400 and 2100   magnesium hydroxide 400 MG/5ML suspension Commonly known as: MILK OF MAGNESIA Take 30 mLs by mouth daily as needed for mild constipation.   neomycin-bacitracin-polymyxin ointment Commonly known as: NEOSPORIN Apply 1 application topically as needed for wound care.   potassium chloride 20 MEQ packet Commonly known as: KLOR-CON Take 20 mEq by mouth daily.       Relevant Imaging Results:  Relevant Lab  Results:   Additional Information    Allayne ButcherJeanna M Devyon Keator, RN

## 2018-08-24 NOTE — Discharge Instructions (Signed)
Urinary Tract Infection, Adult A urinary tract infection (UTI) is an infection of any part of the urinary tract. The urinary tract includes:  The kidneys.  The ureters.  The bladder.  The urethra. These organs make, store, and get rid of pee (urine) in the body. What are the causes? This is caused by germs (bacteria) in your genital area. These germs grow and cause swelling (inflammation) of your urinary tract. What increases the risk? You are more likely to develop this condition if:  You have a small, thin tube (catheter) to drain pee.  You cannot control when you pee or poop (incontinence).  You are female, and: ? You use these methods to prevent pregnancy: ? A medicine that kills sperm (spermicide). ? A device that blocks sperm (diaphragm). ? You have low levels of a female hormone (estrogen). ? You are pregnant.  You have genes that add to your risk.  You are sexually active.  You take antibiotic medicines.  You have trouble peeing because of: ? A prostate that is bigger than normal, if you are female. ? A blockage in the part of your body that drains pee from the bladder (urethra). ? A kidney stone. ? A nerve condition that affects your bladder (neurogenic bladder). ? Not getting enough to drink. ? Not peeing often enough.  You have other conditions, such as: ? Diabetes. ? A weak disease-fighting system (immune system). ? Sickle cell disease. ? Gout. ? Injury of the spine. What are the signs or symptoms? Symptoms of this condition include:  Needing to pee right away (urgently).  Peeing often.  Peeing small amounts often.  Pain or burning when peeing.  Blood in the pee.  Pee that smells bad or not like normal.  Trouble peeing.  Pee that is cloudy.  Fluid coming from the vagina, if you are female.  Pain in the belly or lower back. Other symptoms include:  Throwing up (vomiting).  No urge to eat.  Feeling mixed up (confused).  Being tired  and grouchy (irritable).  A fever.  Watery poop (diarrhea). How is this treated? This condition may be treated with:  Antibiotic medicine.  Other medicines.  Drinking enough water. Follow these instructions at home:  Medicines  Take over-the-counter and prescription medicines only as told by your doctor.  If you were prescribed an antibiotic medicine, take it as told by your doctor. Do not stop taking it even if you start to feel better. General instructions  Make sure you: ? Pee until your bladder is empty. ? Do not hold pee for a long time. ? Empty your bladder after sex. ? Wipe from front to back after pooping if you are a female. Use each tissue one time when you wipe.  Drink enough fluid to keep your pee pale yellow.  Keep all follow-up visits as told by your doctor. This is important. Contact a doctor if:  You do not get better after 1-2 days.  Your symptoms go away and then come back. Get help right away if:  You have very bad back pain.  You have very bad pain in your lower belly.  You have a fever.  You are sick to your stomach (nauseous).  You are throwing up. Summary  A urinary tract infection (UTI) is an infection of any part of the urinary tract.  This condition is caused by germs in your genital area.  There are many risk factors for a UTI. These include having a small, thin   tube to drain pee and not being able to control when you pee or poop.  Treatment includes antibiotic medicines for germs.  Drink enough fluid to keep your pee pale yellow. This information is not intended to replace advice given to you by your health care provider. Make sure you discuss any questions you have with your health care provider. Document Released: 06/25/2007 Document Revised: 12/24/2017 Document Reviewed: 07/16/2017 Elsevier Patient Education  2020 [employer]  

## 2018-08-26 LAB — CULTURE, BLOOD (ROUTINE X 2)
Culture: NO GROWTH
Culture: NO GROWTH
Special Requests: ADEQUATE

## 2018-10-21 ENCOUNTER — Emergency Department: Payer: Medicare Other

## 2018-10-21 ENCOUNTER — Encounter: Payer: Self-pay | Admitting: Emergency Medicine

## 2018-10-21 ENCOUNTER — Other Ambulatory Visit: Payer: Self-pay

## 2018-10-21 ENCOUNTER — Inpatient Hospital Stay
Admission: EM | Admit: 2018-10-21 | Discharge: 2018-10-24 | DRG: 871 | Disposition: A | Discharge status: Nursing Home (Includes ICF) | Payer: Medicare Other | Attending: Internal Medicine | Admitting: Internal Medicine

## 2018-10-21 DIAGNOSIS — N183 Chronic kidney disease, stage 3 unspecified: Secondary | ICD-10-CM | POA: Diagnosis present

## 2018-10-21 DIAGNOSIS — E872 Acidosis: Secondary | ICD-10-CM | POA: Diagnosis present

## 2018-10-21 DIAGNOSIS — F0390 Unspecified dementia without behavioral disturbance: Secondary | ICD-10-CM | POA: Diagnosis present

## 2018-10-21 DIAGNOSIS — I739 Peripheral vascular disease, unspecified: Secondary | ICD-10-CM | POA: Diagnosis present

## 2018-10-21 DIAGNOSIS — N3000 Acute cystitis without hematuria: Secondary | ICD-10-CM

## 2018-10-21 DIAGNOSIS — N179 Acute kidney failure, unspecified: Secondary | ICD-10-CM | POA: Diagnosis present

## 2018-10-21 DIAGNOSIS — D631 Anemia in chronic kidney disease: Secondary | ICD-10-CM | POA: Diagnosis present

## 2018-10-21 DIAGNOSIS — Z66 Do not resuscitate: Secondary | ICD-10-CM | POA: Diagnosis present

## 2018-10-21 DIAGNOSIS — Z20828 Contact with and (suspected) exposure to other viral communicable diseases: Secondary | ICD-10-CM | POA: Diagnosis present

## 2018-10-21 DIAGNOSIS — G9341 Metabolic encephalopathy: Secondary | ICD-10-CM | POA: Diagnosis present

## 2018-10-21 DIAGNOSIS — A419 Sepsis, unspecified organism: Secondary | ICD-10-CM | POA: Diagnosis not present

## 2018-10-21 DIAGNOSIS — I4891 Unspecified atrial fibrillation: Secondary | ICD-10-CM

## 2018-10-21 DIAGNOSIS — Z79899 Other long term (current) drug therapy: Secondary | ICD-10-CM

## 2018-10-21 DIAGNOSIS — B965 Pseudomonas (aeruginosa) (mallei) (pseudomallei) as the cause of diseases classified elsewhere: Secondary | ICD-10-CM | POA: Diagnosis present

## 2018-10-21 DIAGNOSIS — I129 Hypertensive chronic kidney disease with stage 1 through stage 4 chronic kidney disease, or unspecified chronic kidney disease: Secondary | ICD-10-CM | POA: Diagnosis present

## 2018-10-21 DIAGNOSIS — Z7982 Long term (current) use of aspirin: Secondary | ICD-10-CM

## 2018-10-21 DIAGNOSIS — E039 Hypothyroidism, unspecified: Secondary | ICD-10-CM | POA: Diagnosis present

## 2018-10-21 DIAGNOSIS — R6520 Severe sepsis without septic shock: Secondary | ICD-10-CM | POA: Diagnosis present

## 2018-10-21 DIAGNOSIS — R4182 Altered mental status, unspecified: Secondary | ICD-10-CM | POA: Diagnosis not present

## 2018-10-21 DIAGNOSIS — Z79890 Hormone replacement therapy (postmenopausal): Secondary | ICD-10-CM

## 2018-10-21 LAB — COMPREHENSIVE METABOLIC PANEL
ALT: 14 U/L (ref 0–44)
AST: 20 U/L (ref 15–41)
Albumin: 2.9 g/dL — ABNORMAL LOW (ref 3.5–5.0)
Alkaline Phosphatase: 55 U/L (ref 38–126)
Anion gap: 11 (ref 5–15)
BUN: 41 mg/dL — ABNORMAL HIGH (ref 8–23)
CO2: 24 mmol/L (ref 22–32)
Calcium: 8.8 mg/dL — ABNORMAL LOW (ref 8.9–10.3)
Chloride: 101 mmol/L (ref 98–111)
Creatinine, Ser: 1.61 mg/dL — ABNORMAL HIGH (ref 0.44–1.00)
GFR calc Af Amer: 33 mL/min — ABNORMAL LOW (ref >60)
GFR calc non Af Amer: 28 mL/min — ABNORMAL LOW (ref >60)
Glucose, Bld: 187 mg/dL — ABNORMAL HIGH (ref 70–99)
Potassium: 4.2 mmol/L (ref 3.5–5.1)
Sodium: 136 mmol/L (ref 135–145)
Total Bilirubin: 0.5 mg/dL (ref 0.3–1.2)
Total Protein: 8.1 g/dL (ref 6.5–8.1)

## 2018-10-21 LAB — URINALYSIS, COMPLETE (UACMP) WITH MICROSCOPIC
Bilirubin Urine: NEGATIVE
Glucose, UA: NEGATIVE mg/dL
Ketones, ur: NEGATIVE mg/dL
Nitrite: NEGATIVE
Protein, ur: 100 mg/dL — AB
Specific Gravity, Urine: 1.013 (ref 1.005–1.030)
Squamous Epithelial / HPF: NONE SEEN (ref 0–5)
WBC, UA: >50 WBC/hpf — ABNORMAL HIGH (ref 0–5)
pH: 6 (ref 5.0–8.0)

## 2018-10-21 LAB — CBC WITH DIFFERENTIAL/PLATELET
Abs Immature Granulocytes: 0.07 10*3/uL (ref 0.00–0.07)
Basophils Absolute: 0.1 10*3/uL (ref 0.0–0.1)
Basophils Relative: 0 %
Eosinophils Absolute: 0 10*3/uL (ref 0.0–0.5)
Eosinophils Relative: 0 %
HCT: 29.9 % — ABNORMAL LOW (ref 36.0–46.0)
Hemoglobin: 9.7 g/dL — ABNORMAL LOW (ref 12.0–15.0)
Immature Granulocytes: 1 %
Lymphocytes Relative: 8 %
Lymphs Abs: 1.3 10*3/uL (ref 0.7–4.0)
MCH: 31.6 pg (ref 26.0–34.0)
MCHC: 32.4 g/dL (ref 30.0–36.0)
MCV: 97.4 fL (ref 80.0–100.0)
Monocytes Absolute: 1.3 10*3/uL — ABNORMAL HIGH (ref 0.1–1.0)
Monocytes Relative: 8 %
Neutro Abs: 12.5 10*3/uL — ABNORMAL HIGH (ref 1.7–7.7)
Neutrophils Relative %: 83 %
Platelets: 406 10*3/uL — ABNORMAL HIGH (ref 150–400)
RBC: 3.07 MIL/uL — ABNORMAL LOW (ref 3.87–5.11)
RDW: 11.8 % (ref 11.5–15.5)
WBC: 15.2 10*3/uL — ABNORMAL HIGH (ref 4.0–10.5)
nRBC: 0 % (ref 0.0–0.2)

## 2018-10-21 LAB — CREATININE, SERUM
Creatinine, Ser: 1.52 mg/dL — ABNORMAL HIGH (ref 0.44–1.00)
GFR calc Af Amer: 35 mL/min — ABNORMAL LOW (ref >60)
GFR calc non Af Amer: 30 mL/min — ABNORMAL LOW (ref >60)

## 2018-10-21 LAB — BLOOD GAS, VENOUS
Acid-Base Excess: 2.2 mmol/L — ABNORMAL HIGH (ref 0.0–2.0)
Bicarbonate: 25.3 mmol/L (ref 20.0–28.0)
O2 Saturation: 93.1 %
Patient temperature: 37
pCO2, Ven: 34 mmHg — ABNORMAL LOW (ref 44.0–60.0)
pH, Ven: 7.48 — ABNORMAL HIGH (ref 7.250–7.430)
pO2, Ven: 62 mmHg — ABNORMAL HIGH (ref 32.0–45.0)

## 2018-10-21 LAB — CBC
HCT: 33.5 % — ABNORMAL LOW (ref 36.0–46.0)
Hemoglobin: 10.4 g/dL — ABNORMAL LOW (ref 12.0–15.0)
MCH: 31.6 pg (ref 26.0–34.0)
MCHC: 31 g/dL (ref 30.0–36.0)
MCV: 101.8 fL — ABNORMAL HIGH (ref 80.0–100.0)
Platelets: 349 10*3/uL (ref 150–400)
RBC: 3.29 MIL/uL — ABNORMAL LOW (ref 3.87–5.11)
RDW: 11.7 % (ref 11.5–15.5)
WBC: 18.9 10*3/uL — ABNORMAL HIGH (ref 4.0–10.5)
nRBC: 0 % (ref 0.0–0.2)

## 2018-10-21 LAB — TROPONIN I (HIGH SENSITIVITY)
Troponin I (High Sensitivity): 17 ng/L (ref <18)
Troponin I (High Sensitivity): 19 ng/L — ABNORMAL HIGH (ref <18)

## 2018-10-21 LAB — LACTIC ACID, PLASMA
Lactic Acid, Venous: 1.3 mmol/L (ref 0.5–1.9)
Lactic Acid, Venous: 2.4 mmol/L (ref 0.5–1.9)

## 2018-10-21 LAB — GLUCOSE, CAPILLARY: Glucose-Capillary: 186 mg/dL — ABNORMAL HIGH (ref 70–99)

## 2018-10-21 LAB — SARS CORONAVIRUS 2 BY RT PCR (HOSPITAL ORDER, PERFORMED IN ~~LOC~~ HOSPITAL LAB): SARS Coronavirus 2: NEGATIVE

## 2018-10-21 MED ORDER — SODIUM CHLORIDE 0.9 % IV SOLN
INTRAVENOUS | Status: DC
  Administered 2018-10-21 – 2018-10-24 (×4): via INTRAVENOUS

## 2018-10-21 MED ORDER — POTASSIUM CHLORIDE 20 MEQ PO PACK
20.0000 meq | PACK | Freq: Every day | ORAL | Status: DC
  Administered 2018-10-22 – 2018-10-24 (×3): 20 meq via ORAL
  Filled 2018-10-21 (×3): qty 1

## 2018-10-21 MED ORDER — ASPIRIN EC 81 MG PO TBEC
81.0000 mg | DELAYED_RELEASE_TABLET | Freq: Every day | ORAL | Status: DC
  Administered 2018-10-22 – 2018-10-24 (×3): 81 mg via ORAL
  Filled 2018-10-21 (×3): qty 1

## 2018-10-21 MED ORDER — DOCUSATE SODIUM 100 MG PO CAPS
100.0000 mg | ORAL_CAPSULE | Freq: Every day | ORAL | Status: DC
  Administered 2018-10-22 – 2018-10-23 (×2): 100 mg via ORAL
  Filled 2018-10-21 (×2): qty 1

## 2018-10-21 MED ORDER — LORAZEPAM 0.5 MG PO TABS: 0.2500 mg | ORAL_TABLET | Freq: Two times a day (BID) | ORAL | Status: DC | PRN

## 2018-10-21 MED ORDER — ACETAMINOPHEN 325 MG PO TABS
325.0000 mg | ORAL_TABLET | Freq: Four times a day (QID) | ORAL | Status: DC | PRN
  Administered 2018-10-21: 22:00:00 325 mg via ORAL
  Filled 2018-10-21: qty 1

## 2018-10-21 MED ORDER — VITAMIN D 25 MCG (1000 UNIT) PO TABS
2000.0000 [IU] | ORAL_TABLET | Freq: Every day | ORAL | Status: DC
  Administered 2018-10-22 – 2018-10-24 (×3): 2000 [IU] via ORAL
  Filled 2018-10-21 (×3): qty 2

## 2018-10-21 MED ORDER — ONDANSETRON HCL 4 MG PO TABS: 4.0000 mg | ORAL_TABLET | Freq: Four times a day (QID) | ORAL | Status: DC | PRN

## 2018-10-21 MED ORDER — ENOXAPARIN SODIUM 30 MG/0.3ML ~~LOC~~ SOLN
30.0000 mg | SUBCUTANEOUS | Status: DC
  Administered 2018-10-21 – 2018-10-23 (×3): 30 mg via SUBCUTANEOUS
  Filled 2018-10-21 (×3): qty 0.3

## 2018-10-21 MED ORDER — ATENOLOL 25 MG PO TABS
25.0000 mg | ORAL_TABLET | Freq: Every day | ORAL | Status: DC
  Filled 2018-10-21: qty 1

## 2018-10-21 MED ORDER — GUAIFENESIN 100 MG/5ML PO SOLN: 200.0000 mg | Freq: Four times a day (QID) | ORAL | Status: DC | PRN

## 2018-10-21 MED ORDER — ENSURE ENLIVE PO LIQD
237.0000 mL | Freq: Two times a day (BID) | ORAL | Status: DC
  Administered 2018-10-22 – 2018-10-24 (×5): 237 mL via ORAL

## 2018-10-21 MED ORDER — AMLODIPINE BESYLATE 10 MG PO TABS: 10.0000 mg | ORAL_TABLET | Freq: Every day | ORAL | Status: DC

## 2018-10-21 MED ORDER — ALUM & MAG HYDROXIDE-SIMETH 200-200-20 MG/5ML PO SUSP: 30.0000 mL | ORAL | Status: DC | PRN

## 2018-10-21 MED ORDER — PIPERACILLIN-TAZOBACTAM 3.375 G IVPB 30 MIN: 3.3750 g | Freq: Once | INTRAVENOUS | Status: DC

## 2018-10-21 MED ORDER — POLYETHYLENE GLYCOL 3350 17 G PO PACK: 17.0000 g | PACK | Freq: Every day | ORAL | Status: DC | PRN

## 2018-10-21 MED ORDER — MAGNESIUM HYDROXIDE 400 MG/5ML PO SUSP
30.0000 mL | Freq: Every day | ORAL | Status: DC | PRN
  Filled 2018-10-21: qty 30

## 2018-10-21 MED ORDER — LOPERAMIDE HCL 2 MG PO CAPS: 2.0000 mg | ORAL_CAPSULE | ORAL | Status: DC | PRN

## 2018-10-21 MED ORDER — ACETAMINOPHEN 325 MG PO TABS
650.0000 mg | ORAL_TABLET | Freq: Four times a day (QID) | ORAL | Status: DC | PRN
  Administered 2018-10-21 – 2018-10-23 (×3): 650 mg via ORAL
  Filled 2018-10-21 (×2): qty 2

## 2018-10-21 MED ORDER — SODIUM CHLORIDE 0.9 % IV SOLN
1.0000 g | INTRAVENOUS | Status: DC
  Administered 2018-10-21 – 2018-10-22 (×2): 1 g via INTRAVENOUS
  Filled 2018-10-21 (×2): qty 1
  Filled 2018-10-21: qty 10

## 2018-10-21 MED ORDER — LABETALOL HCL 5 MG/ML IV SOLN
10.0000 mg | INTRAVENOUS | Status: DC | PRN
  Administered 2018-10-21: 22:00:00 10 mg via INTRAVENOUS
  Filled 2018-10-21: qty 4

## 2018-10-21 MED ORDER — VANCOMYCIN HCL IN DEXTROSE 1-5 GM/200ML-% IV SOLN: 1000.0000 mg | Freq: Once | INTRAVENOUS | Status: DC

## 2018-10-21 MED ORDER — ONDANSETRON HCL 4 MG/2ML IJ SOLN: 4.0000 mg | Freq: Four times a day (QID) | INTRAMUSCULAR | Status: DC | PRN

## 2018-10-21 MED ORDER — ACETAMINOPHEN 650 MG RE SUPP: 650.0000 mg | Freq: Four times a day (QID) | RECTAL | Status: DC | PRN

## 2018-10-21 MED ORDER — LEVOTHYROXINE SODIUM 88 MCG PO TABS
88.0000 ug | ORAL_TABLET | Freq: Every day | ORAL | Status: DC
  Administered 2018-10-22 – 2018-10-24 (×3): 88 ug via ORAL
  Filled 2018-10-21 (×3): qty 1

## 2018-10-21 MED ORDER — SODIUM CHLORIDE 0.9 % IV SOLN
2.0000 g | Freq: Once | INTRAVENOUS | Status: AC
  Administered 2018-10-21: 2 g via INTRAVENOUS
  Filled 2018-10-21: qty 2

## 2018-10-21 MED ORDER — ADULT MULTIVITAMIN W/MINERALS CH
1.0000 | ORAL_TABLET | Freq: Every day | ORAL | Status: DC
  Administered 2018-10-22 – 2018-10-24 (×3): 1 via ORAL
  Filled 2018-10-21 (×3): qty 1

## 2018-10-21 MED ORDER — LACTATED RINGERS IV BOLUS
1000.0000 mL | Freq: Once | INTRAVENOUS | Status: AC
  Administered 2018-10-21: 1000 mL via INTRAVENOUS

## 2018-10-21 MED ORDER — ACETAMINOPHEN 650 MG RE SUPP
650.0000 mg | Freq: Once | RECTAL | Status: AC
  Administered 2018-10-21: 650 mg via RECTAL
  Filled 2018-10-21: qty 1

## 2018-10-21 MED ORDER — ACETAMINOPHEN 325 MG PO TABS
ORAL_TABLET | ORAL | Status: AC
  Filled 2018-10-21: qty 2

## 2018-10-21 MED ORDER — LORAZEPAM 0.5 MG PO TABS
0.2500 mg | ORAL_TABLET | Freq: Two times a day (BID) | ORAL | Status: DC
  Administered 2018-10-22 – 2018-10-23 (×4): 0.25 mg via ORAL
  Filled 2018-10-21 (×4): qty 1

## 2018-10-21 MED ORDER — HYDROCODONE-ACETAMINOPHEN 5-325 MG PO TABS: 1.0000 | ORAL_TABLET | ORAL | Status: DC | PRN

## 2018-10-21 MED ORDER — BISACODYL 5 MG PO TBEC: 5.0000 mg | DELAYED_RELEASE_TABLET | ORAL | Status: DC | PRN

## 2018-10-21 NOTE — ED Provider Notes (Signed)
[facility] Emergency Department Provider Note   ____________________________________________   First MD Initiated Contact with Patient 10/21/18 1507     (approximate)  I have reviewed the triage vital signs and the nursing notes.   HISTORY  Chief Complaint Altered Mental Status   HPI Sabrina Burke is a 85 y.o. female with past medical history of hypertension and dementia who presents to the ED for altered mental status.  History is limited secondary to patient's dementia.  Per EMS and nursing facility, patient is ambulatory at baseline and interactive, but was noted to be slumped over in her chair today and nonambulatory.  Patient is somnolent but easily arousable, currently denies any pain or other complaints.  EMS reports patient has not been short of breath or had a cough.  Patient does have a history of UTI and sepsis.        Past Medical History:  Diagnosis Date  . Dementia (HCC)   . Hypertension     Patient Active Problem List   Diagnosis Date Noted  . Sepsis (HCC) 06/26/2018    History reviewed. No pertinent surgical history.  Prior to Admission medications   Medication Sig Start Date End Date Taking? Authorizing Provider  acetaminophen (TYLENOL) 325 MG tablet Take 325 mg by mouth every 6 (six) hours as needed for mild pain or fever.    Yes [provider]  alum & mag hydroxide-simeth (MAALOX/MYLANTA) 200-200-20 MG/5ML suspension Take 30 mLs by mouth as needed for indigestion or heartburn (max 4 doses in 24 hours).    Yes [provider]  amLODipine (NORVASC) 10 MG tablet Take 1 tablet (10 mg total) by mouth daily. 06/28/18  Yes Wieting, Richard, MD  aspirin EC 81 MG EC tablet Take 1 tablet (81 mg total) by mouth daily. 06/28/18  Yes Wieting, Richard, MD  atenolol (TENORMIN) 25 MG tablet Take 0.5 tablets (12.5 mg total) by mouth daily. Patient taking differently: Take 25 mg by mouth daily.  06/28/18  Yes Wieting, Richard, MD   bisacodyl (DULCOLAX) 5 MG EC tablet Take 5 mg by mouth every other day as needed for moderate constipation.    Yes [provider]  Cholecalciferol (VITAMIN D3) 50 MCG (2000 UT) TABS Take 2,000 Units by mouth daily.    Yes [provider]  docusate sodium (COLACE) 100 MG capsule Take 100 mg by mouth at bedtime.   Yes [provider]  feeding supplement, ENSURE ENLIVE, (ENSURE ENLIVE) LIQD Take 237 mLs by mouth 2 (two) times daily between meals. 06/28/18  Yes Wieting, Richard, MD  furosemide (LASIX) 20 MG tablet Take 10 mg by mouth daily at 2 PM.    Yes [provider]  furosemide (LASIX) 20 MG tablet Take 20 mg by mouth daily.   Yes [provider]  guaiFENesin (ROBITUSSIN) 100 MG/5ML liquid Take 200 mg by mouth every 6 (six) hours as needed for cough.    Yes [provider]  levothyroxine (SYNTHROID) 88 MCG tablet Take 88 mcg by mouth daily before breakfast.    Yes [provider]  loperamide (IMODIUM) 2 MG capsule Take 2 mg by mouth as needed for diarrhea or loose stools (max 8 doses in 24 hours).    Yes [provider]  LORazepam (ATIVAN) 0.5 MG tablet Take 0.5 tablets (0.25 mg total) by mouth 2 (two) times daily. Takes at 1400 and 2100 08/24/18  Yes Wieting, Richard, MD  LORazepam (ATIVAN) 0.5 MG tablet Take 0.25 mg by mouth  2 (two) times daily as needed for anxiety.   Yes [provider]  magnesium hydroxide (MILK OF MAGNESIA) 400 MG/5ML suspension Take 30 mLs by mouth daily as needed for mild constipation.   Yes [provider]  Multiple Vitamin (DAILY VITE PO) Take 1 tablet by mouth daily.   Yes [provider]  neomycin-bacitracin-polymyxin (NEOSPORIN) ointment Apply 1 application topically as needed for wound care.   Yes [provider]  potassium chloride (KLOR-CON) 20 MEQ packet Take 20 mEq by mouth daily. 08/24/18  Yes Wieting, Richard, MD  cephALEXin (KEFLEX) 250 MG capsule Take 1  capsule (250 mg total) by mouth every 8 (eight) hours. Patient not taking: Reported on 10/21/2018 08/24/18   Alford HighlandWieting, Richard, MD    Allergies Patient has no known allergies.  No family history on file.  Social History Social History   Tobacco Use  . Smoking status: Never Smoker  . Smokeless tobacco: Never Used  . Tobacco comment: pt with advance dementia  Substance Use Topics  . Alcohol use: Not Currently  . Drug use: Not Currently    Review of Systems Unable to obtain secondary to patient's dementia ____________________________________________   PHYSICAL EXAM:  VITAL SIGNS: ED Triage Vitals  Enc Vitals Group     BP 10/21/18 1431 (!) 175/67     Pulse Rate 10/21/18 1431 84     Resp 10/21/18 1427 16     Temp 10/21/18 1449 (!) 102 F (38.9 C)     Temp Source 10/21/18 1449 Rectal     SpO2 10/21/18 1431 94 %     Weight --      Height --      Head Circumference --      Peak Flow --      Pain Score --      Pain Loc --      Pain Edu? --      Excl. in GC? --     Constitutional: Somnolent but arousable to voice Eyes: Conjunctivae are normal. Head: Atraumatic. Nose: No congestion/rhinnorhea. Mouth/Throat: Mucous membranes are moist. Neck: Normal ROM Cardiovascular: Normal rate, regular rhythm. Grossly normal heart sounds. Respiratory: Normal respiratory effort.  No retractions. Lungs CTAB. Gastrointestinal: Soft and nontender. No distention. Genitourinary: deferred Musculoskeletal: No lower extremity tenderness, 1+ pitting edema to bilateral lower extremities with overlying Unna boot dressings. Neurologic: Responds with single words. No gross focal neurologic deficits are appreciated. Skin:  Skin is warm, dry and intact. No rash noted. Psychiatric: Mood and affect are normal. Speech and behavior are normal.  ____________________________________________   LABS (all labs ordered are listed, but only abnormal results are displayed)  Labs Reviewed  LACTIC ACID,  PLASMA - Abnormal; Notable for the following components:      Result Value   Lactic Acid, Venous 2.4 (*)    All other components within normal limits  COMPREHENSIVE METABOLIC PANEL - Abnormal; Notable for the following components:   Glucose, Bld 187 (*)    BUN 41 (*)    Creatinine, Ser 1.61 (*)    Calcium 8.8 (*)    Albumin 2.9 (*)    GFR calc non Af Amer 28 (*)    GFR calc Af Amer 33 (*)    All other components within normal limits  CBC WITH DIFFERENTIAL/PLATELET - Abnormal; Notable for the following components:   WBC 15.2 (*)    RBC 3.07 (*)    Hemoglobin 9.7 (*)    HCT 29.9 (*)    Platelets 406 (*)  Neutro Abs 12.5 (*)    Monocytes Absolute 1.3 (*)    All other components within normal limits  BLOOD GAS, VENOUS - Abnormal; Notable for the following components:   pH, Ven 7.48 (*)    pCO2, Ven 34 (*)    pO2, Ven 62.0 (*)    Acid-Base Excess 2.2 (*)    All other components within normal limits  GLUCOSE, CAPILLARY - Abnormal; Notable for the following components:   Glucose-Capillary 186 (*)    All other components within normal limits  URINALYSIS, COMPLETE (UACMP) WITH MICROSCOPIC - Abnormal; Notable for the following components:   Color, Urine AMBER (*)    APPearance TURBID (*)    Hgb urine dipstick SMALL (*)    Protein, ur 100 (*)    Leukocytes,Ua MODERATE (*)    WBC, UA >50 (*)    Bacteria, UA MANY (*)    All other components within normal limits  CBC - Abnormal; Notable for the following components:   WBC 18.9 (*)    RBC 3.29 (*)    Hemoglobin 10.4 (*)    HCT 33.5 (*)    MCV 101.8 (*)    All other components within normal limits  CREATININE, SERUM - Abnormal; Notable for the following components:   Creatinine, Ser 1.52 (*)    GFR calc non Af Amer 30 (*)    GFR calc Af Amer 35 (*)    All other components within normal limits  TROPONIN I (HIGH SENSITIVITY) - Abnormal; Notable for the following components:   Troponin I (High Sensitivity) 19 (*)    All other  components within normal limits  SARS CORONAVIRUS 2 (HOSPITAL ORDER, PERFORMED IN [facility])  CULTURE, BLOOD (ROUTINE X 2)  CULTURE, BLOOD (ROUTINE X 2)  URINE CULTURE  MRSA PCR SCREENING  LACTIC ACID, PLASMA  BASIC METABOLIC PANEL  CBC  TROPONIN I (HIGH SENSITIVITY)   ____________________________________________  EKG  ED ECG REPORT I, Chesley Noon, the attending physician, personally viewed and interpreted this ECG.   Date: 10/21/2018  EKG Time: 14:41  Rate: 79  Rhythm: atrial fibrillation, rate 79  Axis: Normal  Intervals:none  ST&T Change: None obvious, significant artifact   PROCEDURES  Procedure(s) performed (including Critical Care):  Procedures   ____________________________________________   INITIAL IMPRESSION / ASSESSMENT AND PLAN / ED COURSE       85 year old female with history of dementia presents to the ED for worsening mental status noted at her nursing facility.  She is noted to be febrile to 102 here and given her altered mental status, there is concern for sepsis.  Most likely source appears to be urine as patient admitted for sepsis secondary to UTI in August, no abdominal tenderness or signs of soft tissue infection, no respiratory symptoms noted.  Will treat with cefepime, blood cultures and lactate drawn.  Will initiate fluid resuscitation.  Patient noted to have what appears to be A. fib on her EKG, no reported history of this.  Rate is controlled at this time, will continue to monitor.  UA consistent with UTI, labs show leukocytosis but are otherwise reassuring with lactate within normal limits.  Patient continues to be hemodynamically stable.  Case discussed with hospitalist, who accepts patient for admission.      ____________________________________________   FINAL CLINICAL IMPRESSION(S) / ED DIAGNOSES  Final diagnoses:  Sepsis without acute organ dysfunction, due to unspecified organism (HCC)  Acute cystitis without  hematuria  Dementia without behavioral disturbance, unspecified dementia type (  [facility])     ED Discharge Orders    None       Note:  This document was prepared using Dragon voice recognition software and may include unintentional dictation errors.   Blake Divine, MD 10/21/18 [phone]

## 2018-10-21 NOTE — ED Notes (Signed)
Sitter with pt.  afib on monitor.   

## 2018-10-21 NOTE — ED Notes (Signed)
ED TO INPATIENT HANDOFF REPORT  ED Nurse Name and Phone #: Aarit Kashuba  S Name/Age/Gender Sabrina Burke 85 y.o. female Room/Bed: ED25A/ED25A  Code Status   Code Status: Prior  Home/SNF/Other Nursing Home Patient oriented to: self Is this baseline? Yes   Triage Complete: Triage complete  Chief Complaint ams  Triage Note Pt via EMS from [facility], c/o " slumping over in wheelchair" and becoming altered at 1400. PT is A&O with some confusion and ambulatory at baseline. Hx of dementia. PT has noted emesis on pants. VSS per EMS    Allergies No Known Allergies  Level of Care/Admitting Diagnosis ED Disposition    ED Disposition Condition Comment   Admit  Hospital Area: [facility] [[id number]]  Level of Care: Med-Surg [16]  Covid Evaluation: N/A  Diagnosis: Sepsis [facility]) [[id number]]  Admitting Physician: Adrian Saran [[id number]]  Attending Physician: MODY, Patricia Pesa [[id number]]  Estimated length of stay: past midnight tomorrow  Certification:: I certify this patient will need inpatient services for at least 2 midnights  PT Class (Do Not Modify): Inpatient [101]  PT Acc Code (Do Not Modify): Private [1]       B Medical/Surgery History Past Medical History:  Diagnosis Date  . Dementia (HCC)   . Hypertension    History reviewed. No pertinent surgical history.   A IV Location/Drains/Wounds Patient Lines/Drains/Airways Status   Active Line/Drains/Airways    Name:   Placement date:   Placement time:   Site:   Days:   Peripheral IV 10/21/18 Left Wrist   10/21/18    1437    Wrist   less than 1   Peripheral IV 10/21/18 Right Wrist   10/21/18    1440    Wrist   less than 1   External Urinary Catheter   08/22/18    0150    -   60          Intake/Output Last 24 hours No intake or output data in the 24 hours ending 10/21/18 1856  Labs/Imaging Results for orders placed or performed during the hospital encounter of 10/21/18 (from the past 48 hour(s))  Glucose,  capillary     Status: Abnormal   Collection Time: 10/21/18  2:30 PM  Result Value Ref Range   Glucose-Capillary 186 (H) 70 - 99 mg/dL  Lactic acid, plasma     Status: None   Collection Time: 10/21/18  2:37 PM  Result Value Ref Range   Lactic Acid, Venous 1.3 0.5 - 1.9 mmol/L    Comment: Performed at [facility], [address] [place], [place] [address]  Comprehensive metabolic panel     Status: Abnormal   Collection Time: 10/21/18  2:37 PM  Result Value Ref Range   Sodium 136 135 - 145 mmol/L   Potassium 4.2 3.5 - 5.1 mmol/L   Chloride 101 98 - 111 mmol/L   CO2 24 22 - 32 mmol/L   Glucose, Bld 187 (H) 70 - 99 mg/dL   BUN 41 (H) 8 - 23 mg/dL   Creatinine, Ser 1.66 (H) 0.44 - 1.00 mg/dL   Calcium 8.8 (L) 8.9 - 10.3 mg/dL   Total Protein 8.1 6.5 - 8.1 g/dL   Albumin 2.9 (L) 3.5 - 5.0 g/dL   AST 20 15 - 41 U/L   ALT 14 0 - 44 U/L   Alkaline Phosphatase 55 38 - 126 U/L   Total Bilirubin 0.5 0.3 - 1.2 mg/dL   GFR calc non Af Amer 28 (L) >  60 mL/min   GFR calc Af Amer 33 (L) >60 mL/min   Anion gap 11 5 - 15    Comment: Performed at [facility], [address] [place], [place] [address]  CBC WITH DIFFERENTIAL     Status: Abnormal   Collection Time: 10/21/18  2:37 PM  Result Value Ref Range   WBC 15.2 (H) 4.0 - 10.5 K/uL   RBC 3.07 (L) 3.87 - 5.11 MIL/uL   Hemoglobin 9.7 (L) 12.0 - 15.0 g/dL   HCT 60.429.9 (L) 54.036.0 - 98.146.0 %   MCV 97.4 80.0 - 100.0 fL   MCH 31.6 26.0 - 34.0 pg   MCHC 32.4 30.0 - 36.0 g/dL   RDW 19.111.8 47.811.5 - 29.515.5 %   Platelets 406 (H) 150 - 400 K/uL   nRBC 0.0 0.0 - 0.2 %   Neutrophils Relative % 83 %   Neutro Abs 12.5 (H) 1.7 - 7.7 K/uL   Lymphocytes Relative 8 %   Lymphs Abs 1.3 0.7 - 4.0 K/uL   Monocytes Relative 8 %   Monocytes Absolute 1.3 (H) 0.1 - 1.0 K/uL   Eosinophils Relative 0 %   Eosinophils Absolute 0.0 0.0 - 0.5 K/uL   Basophils Relative 0 %   Basophils Absolute 0.1 0.0 - 0.1 K/uL   Immature Granulocytes 1 %   Abs Immature  Granulocytes 0.07 0.00 - 0.07 K/uL    Comment: Performed at [facility], [address] [place], [place] [address]  Blood gas, venous ([facility], [facility], [facility])     Status: Abnormal   Collection Time: 10/21/18  2:37 PM  Result Value Ref Range   pH, Ven 7.48 (H) 7.250 - 7.430   pCO2, Ven 34 (L) 44.0 - 60.0 mmHg   pO2, Ven 62.0 (H) 32.0 - 45.0 mmHg   Bicarbonate 25.3 20.0 - 28.0 mmol/L   Acid-Base Excess 2.2 (H) 0.0 - 2.0 mmol/L   O2 Saturation 93.1 %   Patient temperature 37.0    Collection site VEIN    Sample type VENOUS     Comment: Performed at [facility], [address] [place], [place] [address]  SARS Coronavirus 2 [facility](Hospital order, Performed in [facility] hospital lab) Nasopharyngeal Nasopharyngeal Swab     Status: None   Collection Time: 10/21/18  2:37 PM   Specimen: Nasopharyngeal Swab  Result Value Ref Range   SARS Coronavirus 2 NEGATIVE NEGATIVE    Comment: (NOTE) If result is NEGATIVE SARS-CoV-2 target nucleic acids are NOT DETECTED. The SARS-CoV-2 RNA is generally detectable in upper and lower  respiratory specimens during the acute phase of infection. The lowest  concentration of SARS-CoV-2 viral copies this assay can detect is 250  copies / mL. A negative result does not preclude SARS-CoV-2 infection  and should not be used as the sole basis for treatment or other  patient management decisions.  A negative result may occur with  improper specimen collection / handling, submission of specimen other  than nasopharyngeal swab, presence of viral mutation(s) within the  areas targeted by this assay, and inadequate number of viral copies  (<250 copies / mL). A negative result must be combined with clinical  observations, patient history, and epidemiological information. If result is POSITIVE SARS-CoV-2 target nucleic acids are DETECTED. The SARS-CoV-2 RNA is generally detectable in upper and lower  respiratory specimens dur ing the acute phase of infection.   Positive  results are indicative of active infection with SARS-CoV-2.  Clinical  correlation with patient  history and other diagnostic information is  necessary to determine patient infection status.  Positive results do  not rule out bacterial infection or co-infection with other viruses. If result is PRESUMPTIVE POSTIVE SARS-CoV-2 nucleic acids MAY BE PRESENT.   A presumptive positive result was obtained on the submitted specimen  and confirmed on repeat testing.  While 2019 novel coronavirus  (SARS-CoV-2) nucleic acids may be present in the submitted sample  additional confirmatory testing may be necessary for epidemiological  and / or clinical management purposes  to differentiate between  SARS-CoV-2 and other Sarbecovirus currently known to infect humans.  If clinically indicated additional testing with an alternate test  methodology [phone]) is advised. The SARS-CoV-2 RNA is generally  detectable in upper and lower respiratory sp ecimens during the acute  phase of infection. The expected result is Negative. Fact Sheet for Patients:  StrictlyIdeas.no Fact Sheet for Healthcare Providers: BankingDealers.co.za This test is not yet approved or cleared by the [place] FDA and has been authorized for detection and/or diagnosis of SARS-CoV-2 by FDA under an Emergency Use Authorization (EUA).  This EUA will remain in effect (meaning this test can be used) for the duration of the COVID-19 declaration under Section 564(b)(1) of the Act, 21 U.S.C. section 360bbb-3(b)(1), unless the authorization is terminated or revoked sooner. Performed at [facility], [address] [place], [place] [address]   Urinalysis, Complete w Microscopic     Status: Abnormal   Collection Time: 10/21/18  2:37 PM  Result Value Ref Range   Color, Urine AMBER (A) YELLOW    Comment: BIOCHEMICALS MAY BE AFFECTED BY COLOR   APPearance TURBID (A) CLEAR    Specific Gravity, Urine 1.013 1.005 - 1.030   pH 6.0 5.0 - 8.0   Glucose, UA NEGATIVE NEGATIVE mg/dL   Hgb urine dipstick SMALL (A) NEGATIVE   Bilirubin Urine NEGATIVE NEGATIVE   Ketones, ur NEGATIVE NEGATIVE mg/dL   Protein, ur 100 (A) NEGATIVE mg/dL   Nitrite NEGATIVE NEGATIVE   Leukocytes,Ua MODERATE (A) NEGATIVE   RBC / HPF 11-20 0 - 5 RBC/hpf   WBC, UA >50 (H) 0 - 5 WBC/hpf   Bacteria, UA MANY (A) NONE SEEN   Squamous Epithelial / LPF NONE SEEN 0 - 5   WBC Clumps PRESENT     Comment: Performed at [facility], [place], [place] [address]  Troponin I (High Sensitivity)     Status: None   Collection Time: 10/21/18  2:37 PM  Result Value Ref Range   Troponin I (High Sensitivity) 17 <18 ng/L    Comment: (NOTE) Elevated high sensitivity troponin I (hsTnI) values and significant  changes across serial measurements may suggest ACS but many other  chronic and acute conditions are known to elevate hsTnI results.  Refer to the "Links" section for chest pain algorithms and additional  guidance. Performed at [facility], [place], [place] [address]   Troponin I (High Sensitivity)     Status: Abnormal   Collection Time: 10/21/18  5:21 PM  Result Value Ref Range   Troponin I (High Sensitivity) 19 (H) <18 ng/L    Comment: (NOTE) Elevated high sensitivity troponin I (hsTnI) values and significant  changes across serial measurements may suggest ACS but many other  chronic and acute conditions are known to elevate hsTnI results.  Refer to the "Links" section for chest pain algorithms and additional  guidance. Performed at [facility], [address] [place],  [place] [address]    Dg Chest Port 1 View  Result Date: 10/21/2018 CLINICAL DATA:  Fevers EXAM: PORTABLE CHEST 1 VIEW COMPARISON:  06/26/2018 FINDINGS: Cardiac shadow is at the upper limits of normal in size. Aortic calcifications are again noted and stable.  Elevation of the right hemidiaphragm is noted. Multiple skin folds are noted over the left lung. No focal infiltrate or effusion is seen. IMPRESSION: No active disease. Electronically Signed   By: Alcide Clever M.D.   On: 10/21/2018 15:19    Pending Labs Unresulted Labs (From admission, onward)    Start     Ordered   10/21/18 1437  Lactic acid, plasma  STAT Now then every 3 hours,   STAT     10/21/18 1437   10/21/18 1437  Blood Culture (routine x 2)  BLOOD CULTURE X 2,   STAT     10/21/18 1437   10/21/18 1437  Urine culture  ONCE - STAT,   STAT     10/21/18 1437   Signed and Held  CBC  (enoxaparin (LOVENOX)    CrCl >/= 30 ml/min)  Once,   R    Comments: Baseline for enoxaparin therapy IF NOT ALREADY DRAWN.  Notify MD if PLT < 100 K.    Signed and Held   Signed and Held  Creatinine, serum  (enoxaparin (LOVENOX)    CrCl >/= 30 ml/min)  Once,   R    Comments: Baseline for enoxaparin therapy IF NOT ALREADY DRAWN.    Signed and Held   Signed and Held  Creatinine, serum  (enoxaparin (LOVENOX)    CrCl >/= 30 ml/min)  Weekly,   R    Comments: while on enoxaparin therapy    Signed and Held   Signed and Held  Basic metabolic panel  Tomorrow morning,   R     Signed and Held   Signed and Held  CBC  Tomorrow morning,   R     Signed and Held          Vitals/Pain Today's Vitals   10/21/18 1530 10/21/18 1545 10/21/18 1600 10/21/18 1700  BP:   (!) 149/97 (!) 170/67  Pulse: 79 87  80  Resp: (!) (!) 24  Temp:      TempSrc:      SpO2: 96% 95%  96%    Isolation Precautions No active isolations  Medications Medications  lactated ringers bolus 1,000 mL (0 mLs Intravenous Stopped 10/21/18 1559)  acetaminophen (TYLENOL) suppository 650 mg (650 mg Rectal Given 10/21/18 1527)  ceFEPIme (MAXIPIME) 2 g in sodium chloride 0.9 % 100 mL IVPB (0 g Intravenous Stopped 10/21/18 1559)    Mobility non-ambulatory     Focused Assessments Cardiac Assessment Handoff:  Cardiac Rhythm: Atrial  fibrillation No results found for: CKTOTAL, CKMB, CKMBINDEX, TROPONINI No results found for: DDIMER Does the Patient currently have chest pain? No      R Recommendations: See Admitting Provider Note  Report given to:   Additional Notes: none

## 2018-10-21 NOTE — ED Notes (Signed)
Myriam Jacobson rn floor nurse states mews score to high and will not take pt at this time.  Charge nurse andrea rn aware.

## 2018-10-21 NOTE — ED Notes (Signed)
Report called to casey rn floor nurse 

## 2018-10-21 NOTE — ED Notes (Signed)
Pure wic applied to pt

## 2018-10-21 NOTE — ED Notes (Signed)
Pt pulled iv out.  Charge nurse aware so we can get a Air cabin crew.

## 2018-10-21 NOTE — ED Notes (Signed)
Resumed care from [place] rn.  Iv meds infusing.  Pt nonverbal.  afib on monitor.

## 2018-10-21 NOTE — Progress Notes (Signed)
Rapid Response Event Note  Overview:   Pt was taken to floor by ED.  Initial VS was a MEWS of 6.  Pt is febrile and hypertensive.  Pt has baseline advanced dementia    Initial Focused Assessment: Pt is nonverbal. Has tremors. Afebrile.  Appears in no acute distress  Interventions: Gave tylenol 325 per the current order, started maintenance IVF.  Paged Dr Jannifer Franklin.  Additional tylenol of 650mg  given.    Plan of Care (if not transferred):   Red MEWS VS protocol of Q15 min VS initiated. Recheck temp in 1 hour Event Summary:   at      at          [employer]

## 2018-10-21 NOTE — ED Triage Notes (Addendum)
Pt via EMS from [facility], c/o " slumping over in wheelchair" and becoming altered at 1400. PT is A&O with some confusion and ambulatory at baseline. Hx of dementia. PT has noted emesis on pants. VSS per EMS

## 2018-10-21 NOTE — ED Notes (Signed)
Pt with sitter.  Pt sleeping.

## 2018-10-21 NOTE — ED Notes (Signed)
ED Provider at bedside. 

## 2018-10-21 NOTE — Progress Notes (Addendum)
Pt brought to floor without report.  Initial MEWS 6 with temp 103.4,  RR 24, blood pressure 220/ 80.  Now bp 187/98.  Pt with dementia and unable to answer questions.  I speaking in foreign language.  Started IVF and called a rapid.  AC notified.  Dorna Bloom RN Spoke with Dr Jannifer Franklin and received order to give additional 650 po tylenol for temperature and prn medication for bp. Text paged Dr Jannifer Franklin with new lactic acid of 2.4 with no new orders. IVF running and IV ABX started. Dorna Bloom RN

## 2018-10-21 NOTE — Consult Note (Signed)
PHARMACY -  BRIEF ANTIBIOTIC NOTE   Pharmacy has received consult(s) for cefepime from an ED provider.  The patient's profile has been reviewed for ht/wt/allergies/indication/available labs.    One time order(s) placed for 2 grams IV cefepime  Further antibiotics/pharmacy consults should be ordered by admitting physician if indicated.                       Thank you,  Dallie Piles, PharmD 10/21/2018  3:34 PM

## 2018-10-21 NOTE — H&P (Addendum)
[facility] [place] at [facility]   PATIENT NAME: Sabrina Burke    MR#:  [id number]  DATE OF BIRTH:  10/13/1927  DATE OF ADMISSION:  10/21/2018  PRIMARY CARE PHYSICIAN: Devoria GlassingLambert, Tracey, NP   REQUESTING/REFERRING PHYSICIAN:  Dr Larinda Butteryjessup  CHIEF COMPLAINT:    AMS HISTORY OF PRESENT ILLNESS:  Sabrina Burke  is a 85 y.o. female with a known history of dementia and admission in early August 2020 for sepsis from [place] UTI who presents from [facility], c/o " slumping over in wheelchair". She is ambulatory at baseline and interactive but was noted to be slumped over in her chair this afternoon.  She is treated for sepsis in ED and found to have UTI.  Patient with dementia unable to provide HPI. HPI from ED MD.   PAST MEDICAL HISTORY:   Past Medical History:  Diagnosis Date  . Dementia (HCC)   . Hypertension   Hypothyroid Peripheral vascular disease  PAST SURGICAL HISTORY:  None recorded on medical chart  SOCIAL HISTORY:   Social History   Tobacco Use  . Smoking status: Never Smoker  . Smokeless tobacco: Never Used  . Tobacco comment: pt with advance dementia  Substance Use Topics  . Alcohol use: Not Currently    FAMILY HISTORY:  Unreliable patient due to dementia  DRUG ALLERGIES:  No Known Allergies  REVIEW OF SYSTEMS:   Review of Systems  Unable to perform ROS: Dementia    MEDICATIONS AT HOME:   Prior to Admission medications   Medication Sig Start Date End Date Taking? Authorizing Provider  acetaminophen (TYLENOL) 325 MG tablet Take 325 mg by mouth every 6 (six) hours as needed for mild pain or fever.    Yes [provider]  alum & mag hydroxide-simeth (MAALOX/MYLANTA) 200-200-20 MG/5ML suspension Take 30 mLs by mouth as needed for indigestion or heartburn (max 4 doses in 24 hours).    Yes [provider]  amLODipine (NORVASC) 10 MG tablet Take 1 tablet (10 mg total) by mouth daily. 06/28/18  Yes Wieting,  Richard, MD  aspirin EC 81 MG EC tablet Take 1 tablet (81 mg total) by mouth daily. 06/28/18  Yes Wieting, Richard, MD  atenolol (TENORMIN) 25 MG tablet Take 0.5 tablets (12.5 mg total) by mouth daily. Patient taking differently: Take 25 mg by mouth daily.  06/28/18  Yes Wieting, Richard, MD  bisacodyl (DULCOLAX) 5 MG EC tablet Take 5 mg by mouth every other day as needed for moderate constipation.    Yes [provider]  Cholecalciferol (VITAMIN D3) 50 MCG (2000 UT) TABS Take 2,000 Units by mouth daily.    Yes [provider]  docusate sodium (COLACE) 100 MG capsule Take 100 mg by mouth at bedtime.   Yes [provider]  feeding supplement, ENSURE ENLIVE, (ENSURE ENLIVE) LIQD Take 237 mLs by mouth 2 (two) times daily between meals. 06/28/18  Yes Wieting, Richard, MD  furosemide (LASIX) 20 MG tablet Take 10 mg by mouth daily at 2 PM.    Yes [provider]  furosemide (LASIX) 20 MG tablet Take 20 mg by mouth daily.   Yes [provider]  guaiFENesin (ROBITUSSIN) 100 MG/5ML liquid Take 200 mg by mouth every 6 (six) hours as needed for cough.    Yes [provider]  levothyroxine (SYNTHROID) 88 MCG tablet Take 88 mcg by mouth daily before breakfast.    Yes [provider]  loperamide (IMODIUM) 2 MG capsule Take 2 mg by  mouth as needed for diarrhea or loose stools (max 8 doses in 24 hours).    Yes [provider]  LORazepam (ATIVAN) 0.5 MG tablet Take 0.5 tablets (0.25 mg total) by mouth 2 (two) times daily. Takes at 1400 and 2100 08/24/18  Yes Wieting, Richard, MD  LORazepam (ATIVAN) 0.5 MG tablet Take 0.25 mg by mouth 2 (two) times daily as needed for anxiety.   Yes [provider]  magnesium hydroxide (MILK OF MAGNESIA) 400 MG/5ML suspension Take 30 mLs by mouth daily as needed for mild constipation.   Yes [provider]  Multiple Vitamin (DAILY VITE PO) Take 1 tablet by mouth daily.   Yes [provider]   neomycin-bacitracin-polymyxin (NEOSPORIN) ointment Apply 1 application topically as needed for wound care.   Yes [provider]  potassium chloride (KLOR-CON) 20 MEQ packet Take 20 mEq by mouth daily. 08/24/18  Yes Wieting, Richard, MD  cephALEXin (KEFLEX) 250 MG capsule Take 1 capsule (250 mg total) by mouth every 8 (eight) hours. Patient not taking: Reported on 10/21/2018 08/24/18   Loletha Grayer, MD      VITAL SIGNS:  Blood pressure (!) 149/97, pulse 87, temperature (!) 102 F (38.9 C), temperature source Rectal, resp. rate 17, SpO2 95 %.  PHYSICAL EXAMINATION:   Physical Exam Constitutional:      General: She is not in acute distress. HENT:     Head: Normocephalic.  Eyes:     General: No scleral icterus. Neck:     Musculoskeletal: Normal range of motion and neck supple.     Vascular: No JVD.     Trachea: No tracheal deviation.  Cardiovascular:     Rate and Rhythm: Normal rate. Rhythm irregular.     Heart sounds: Normal heart sounds. No murmur. No friction rub. No gallop.   Pulmonary:     Effort: Pulmonary effort is normal. No respiratory distress.     Breath sounds: Normal breath sounds. No wheezing or rales.  Chest:     Chest wall: No tenderness.  Abdominal:     General: Bowel sounds are normal. There is no distension.     Palpations: Abdomen is soft. There is no mass.     Tenderness: There is no abdominal tenderness. There is no guarding or rebound.  Musculoskeletal: Normal range of motion.     Right lower leg: Edema present.     Left lower leg: Edema present.  Skin:    General: Skin is warm.     Findings: Bruising present. No erythema or rash.     Comments: Bruising noted on her arms Bilateral Unna wraps lower extremities with 1+ pedal edema  Neurological:     General: No focal deficit present.     Mental Status: She is alert. Mental status is at baseline.     Comments: Does not follow commands well Does not speak much  Psychiatric:     Comments:  dementia       LABORATORY PANEL:   CBC Recent Labs  Lab 10/21/18 1437  WBC 15.2*  HGB 9.7*  HCT 29.9*  PLT 406*   ------------------------------------------------------------------------------------------------------------------  Chemistries  Recent Labs  Lab 10/21/18 1437  NA 136  K 4.2  CL 101  CO2 24  GLUCOSE 187*  BUN 41*  CREATININE 1.61*  CALCIUM 8.8*  AST 20  ALT 14  ALKPHOS 55  BILITOT 0.5   ------------------------------------------------------------------------------------------------------------------  Cardiac Enzymes No results for input(s): TROPONINI in the last 168 hours. ------------------------------------------------------------------------------------------------------------------  RADIOLOGY:  Dg Chest Port 1 View  Result Date: 10/21/2018 CLINICAL DATA:  Fevers EXAM: PORTABLE CHEST 1 VIEW COMPARISON:  06/26/2018 FINDINGS: Cardiac shadow is at the upper limits of normal in size. Aortic calcifications are again noted and stable. Elevation of the right hemidiaphragm is noted. Multiple skin folds are noted over the left lung. No focal infiltrate or effusion is seen. IMPRESSION: No active disease. Electronically Signed   By: Alcide Clever M.D.   On: 10/21/2018 15:19    EKG:  Reading as a fib but baseline is very shaky   IMPRESSION AND PLAN:   85 y/o female with dementia and CKD stage 3  from [facility] who comes to ED with AMS.  1. Sepsis with tachycardia and elevated WBC. Sepsis is from UTI. Patient recently discharged in early August with Klebsiella UTI sepsis.  I will continue Rocephin as per previous sensitivities.  2 Acute metabolic encephalopathy in the setting of sepsis and UTI 3.  Essential hypertension: Continue Norvasc and atenolol  4.  Dementia: Patient appears to be at baseline  5.  Hypothyroidism: Continue Synthroid  6. Stage III: Creatinine is at baseline.   All the records are reviewed and case discussed with ED  provider.  CODE STATUS: SNR TOTAL TIME TAKING CARE OF THIS PATIENT: 41 minutes.    Adrian Saran M.D on 10/21/2018 at 5:39 PM  Between 7am to 6pm - Pager - [phone]  After 6pm go to [email or web address] - Social research officer, government  [facility]  Office  [phone]  CC: Primary care physician; Devoria Glassing, NP

## 2018-10-21 NOTE — Progress Notes (Signed)
PHARMACIST - PHYSICIAN COMMUNICATION  CONCERNING:  Enoxaparin (Lovenox) for DVT Prophylaxis    RECOMMENDATION: Patient was prescribed enoxaprin 40mg  q24 hours for VTE prophylaxis.   There were no vitals filed for this visit.  There is no height or weight on file to calculate BMI.  Previous weight noted to be 59Kg.  Asked nursing to update current height and weight.    CrCl cannot be calculated (Unknown ideal weight.).  SCr = 1.52 on 10/1 at 2119  Patient is candidate for enoxaparin 30mg  every 24 hours based on CrCl <77ml/min or Weight less then 45kg   DESCRIPTION: Pharmacy has adjusted enoxaparin dose per [facility] policy.  Patient is now receiving enoxaparin 30mg  every 24 hours.  Will continue to follow and adjust as warranted.    Ena Dawley, PharmD Clinical Pharmacist  10/21/2018 9:48 PM

## 2018-10-21 NOTE — ED Notes (Signed)
Pt had large diarrhea noted in brief. PT clean and changed at this time

## 2018-10-21 NOTE — Progress Notes (Signed)
   10/21/18 2200  Clinical Encounter Type  Visited With Patient not available  Visit Type Initial  Referral From Nurse  Consult/Referral To Chaplain  Spiritual Encounters  Spiritual Needs Other (Comment)  Chaplain received page for RR arrived and made pastoral presence known. AC said it was ok to leave.

## 2018-10-22 ENCOUNTER — Other Ambulatory Visit: Payer: Self-pay

## 2018-10-22 LAB — BASIC METABOLIC PANEL
Anion gap: 10 (ref 5–15)
BUN: 40 mg/dL — ABNORMAL HIGH (ref 8–23)
CO2: 25 mmol/L (ref 22–32)
Calcium: 8.4 mg/dL — ABNORMAL LOW (ref 8.9–10.3)
Chloride: 104 mmol/L (ref 98–111)
Creatinine, Ser: 1.44 mg/dL — ABNORMAL HIGH (ref 0.44–1.00)
GFR calc Af Amer: 37 mL/min — ABNORMAL LOW (ref >60)
GFR calc non Af Amer: 32 mL/min — ABNORMAL LOW (ref >60)
Glucose, Bld: 151 mg/dL — ABNORMAL HIGH (ref 70–99)
Potassium: 3.6 mmol/L (ref 3.5–5.1)
Sodium: 139 mmol/L (ref 135–145)

## 2018-10-22 LAB — BLOOD CULTURE ID PANEL (REFLEXED)

## 2018-10-22 LAB — CBC
HCT: 27.3 % — ABNORMAL LOW (ref 36.0–46.0)
Hemoglobin: 8.6 g/dL — ABNORMAL LOW (ref 12.0–15.0)
MCH: 31.3 pg (ref 26.0–34.0)
MCHC: 31.5 g/dL (ref 30.0–36.0)
MCV: 99.3 fL (ref 80.0–100.0)
Platelets: 286 10*3/uL (ref 150–400)
RBC: 2.75 MIL/uL — ABNORMAL LOW (ref 3.87–5.11)
RDW: 11.7 % (ref 11.5–15.5)
WBC: 11.8 10*3/uL — ABNORMAL HIGH (ref 4.0–10.5)
nRBC: 0 % (ref 0.0–0.2)

## 2018-10-22 LAB — MRSA PCR SCREENING: MRSA by PCR: NEGATIVE

## 2018-10-22 MED ORDER — ORAL CARE MOUTH RINSE
15.0000 mL | Freq: Two times a day (BID) | OROMUCOSAL | Status: DC
  Administered 2018-10-22 – 2018-10-24 (×5): 15 mL via OROMUCOSAL

## 2018-10-22 MED ORDER — NYSTATIN 100000 UNIT/ML MT SUSP
5.0000 mL | Freq: Four times a day (QID) | OROMUCOSAL | Status: DC
  Administered 2018-10-22 – 2018-10-24 (×7): 500000 [IU] via ORAL
  Filled 2018-10-22 (×7): qty 5

## 2018-10-22 MED ORDER — AMLODIPINE BESYLATE 5 MG PO TABS
5.0000 mg | ORAL_TABLET | Freq: Every day | ORAL | Status: DC
  Administered 2018-10-22 – 2018-10-24 (×3): 5 mg via ORAL
  Filled 2018-10-22 (×3): qty 1

## 2018-10-22 NOTE — Progress Notes (Signed)
Removed unna boots as pt has pitting edema to tops of feet outside of wrap.  Place scd's on and elevated legs on pillow.  Pt has an abrasion to right shin. Dorna Bloom RN

## 2018-10-22 NOTE — Progress Notes (Addendum)
PHARMACY - PHYSICIAN COMMUNICATION CRITICAL VALUE ALERT - BLOOD CULTURE IDENTIFICATION (BCID)  Sabrina Burke is an 85 y.o. female who presented to [facility] on 10/21/2018 with a chief complaint of AMS 2/2 UTI.  Assessment:  1/4 bottles (anaerobic bottle) of Staph species, MecA detected. Likely a contaminant. WBC trended down from yesterday (11.8 today), temperature down trending to normal range and is now 97.39F (temperature in the past 24 hours 97.4 - 103.4). Patient is currently on ceftriaxone.  Name of physician (or Provider) Contacted: Dr/ Leslye Peer  Current antibiotics: Ceftriaxone 1 g Q24H   Changes to prescribed antibiotics recommended:  May consider continuing antibiotic for now.  Recommendations accepted by provider  No results found for this or any previous visit.  Rowland Lathe 10/22/2018  8:36 AM

## 2018-10-22 NOTE — Evaluation (Signed)
Clinical/Bedside Swallow Evaluation Patient Details  Name: Sabrina Burke MRN: [id number] Date of Birth: 07-28-1927  Today's Date: 10/22/2018 Time: SLP Start Time (ACUTE ONLY): 0945 SLP Stop Time (ACUTE ONLY): 1045 SLP Time Calculation (min) (ACUTE ONLY): 60 min  Past Medical History:  Past Medical History:  Diagnosis Date  . Dementia ([place])   . Hypertension    Past Surgical History: History reviewed. No pertinent surgical history. HPI:  Pt is an 85 y.o. female with past medical history of hypertension and Dementia who presents to the ED for altered mental status.  History is limited secondary to patient's dementia.  Per EMS and nursing facility, patient is ambulatory at baseline and interactive, but was noted to be slumped over in her chair today and nonambulatory.  Patient is somnolent but easily arousable, currently denies any pain or other complaints.  EMS reports patient has not been short of breath or had a cough.  Patient does have a history of UTI and sepsis prior.  Pt resides at ALF nursing center and requires assistance w/ all ADLs.    Assessment / Plan / Recommendation Clinical Impression  Pt appears to present w/ adequate pharyngeal phase swallowing function w/ No overt s/s of aspiration noted w/ all po trials of thin liquids and purees given at eval; no decline in respiratory status or vocal quality during/post po trials. However, pt does exhibit oral phase dysphagia c/b increased oral phase time intermittently, poor attention to mashing/gumming increased textured foods, and decreased attention overall during po tasks(cues required) -- suspect this is impacted by pt's baseline dx'd Dementia. Pt is also Edentulous. Pt demonstrated adequate bolus maangement and control of trials given; no anterior leakage/spillage. A-P transfer achieved w/ each trial, and full oral clearing post swallows noted. OM exam revealed no unilateral weakness. Tongue appeared to have a Coating -- NSG to f/u w/ MD  for potential Thrush. Pt required mod+ verbal cues for follow through w/ tasks d/t overall Confusion. Recommend Dietician f/u for support; recommend a Dysphagia level 1 (PUREE) w/ Thin liquids. General aspiration and Reflux precautions; Feeding Support at meals; Pills CRUSHED in Puree for safer swallowing. NSG to reconsult ST services if any decline in status is noted during admission.  SLP Visit Diagnosis: Dysphagia, oral phase (R13.11)(impact of Cognitive decline/Dementia)    Aspiration Risk  Mild aspiration risk(but reduced following general precautions)    Diet Recommendation  Dysphagia level 1 (PUREE) w/ thin liquids; general aspiration and Reflux precautions; feeding Support at all meals d/t Cognitive decline  Medication Administration: Crushed with puree(for safer swallowing)    Other  Recommendations Recommended Consults: (Dietician f/u) Oral Care Recommendations: Oral care BID;Staff/trained caregiver to provide oral care Other Recommendations: (n/a)   Follow up Recommendations None      Frequency and Duration (n/a)  (n/a)       Prognosis Prognosis for Safe Diet Advancement: Fair Barriers to Reach Goals: Cognitive deficits;Time post onset;Severity of deficits      Swallow Study   General Date of Onset: 10/21/18 HPI: Pt is an 85 y.o. female with past medical history of hypertension and Dementia who presents to the ED for altered mental status.  History is limited secondary to patient's dementia.  Per EMS and nursing facility, patient is ambulatory at baseline and interactive, but was noted to be slumped over in her chair today and nonambulatory.  Patient is somnolent but easily arousable, currently denies any pain or other complaints.  EMS reports patient has not been short of breath or had  a cough.  Patient does have a history of UTI and sepsis prior.  Pt resides at ALF nursing center and requires assistance w/ all ADLs.  Type of Study: Bedside Swallow Evaluation Previous  Swallow Assessment: none noted Diet Prior to this Study: NPO Temperature Spikes Noted: No(prev. elevation yesterday; 11.8 wbc) Respiratory Status: Room air History of Recent Intubation: No Behavior/Cognition: Alert;Cooperative;Pleasant mood;Confused;Distractible;Requires cueing;Doesn't follow directions Oral Cavity Assessment: (tongue coated -- suspect Thrush) Oral Care Completed by SLP: Recent completion by staff Oral Cavity - Dentition: Edentulous Vision: (n/a) Self-Feeding Abilities: Total assist Patient Positioning: Upright in bed(needed positioning) Baseline Vocal Quality: Low vocal intensity(mumbled speech at times; few verbalizations) Volitional Cough: Cognitively unable to elicit Volitional Swallow: Unable to elicit    Oral/Motor/Sensory Function Overall Oral Motor/Sensory Function: Within functional limits(w/ bolus management and oral clearing)   Ice Chips Ice chips: Within functional limits Presentation: Spoon(fed; 2 trials)   Thin Liquid Thin Liquid: Within functional limits Presentation: Cup;Straw(mostly straw; ~8+ ozs total) Other Comments: water, ensure    Nectar Thick Nectar Thick Liquid: Not tested   Honey Thick Honey Thick Liquid: Not tested   Puree Puree: Within functional limits Presentation: Spoon(fed; 4 ozs)   Solid     Solid: Not tested Other Comments: d/t Cognitive decline and edentulous status        Jerilynn Som, MS, CCC-SLP Sabrina Burke 10/22/2018,2:44 PM

## 2018-10-22 NOTE — Progress Notes (Signed)
BP 108/35 (58), HR 54-58, T 97.4 oral, O2Sat 97-100% RA, RR 16.  Notified hospitalist via text page of low bp. Place warm blanket on pt. IVF .9@50  infusing. Dorna Bloom RN

## 2018-10-22 NOTE — Progress Notes (Signed)
Patient ID: Sabrina Burke, female   DOB: 27-Aug-1927, 85 y.o.   MRN: [id number]  [employer] PROGRESS NOTE  Sabrina Burke NID:[id number] DOB: 12-24-1927 DOA: 10/21/2018 PCP: Odessa Fleming, NP  HPI/Subjective:   Objective: Vitals:   10/22/18 0546 10/22/18 1001  BP: (!) 151/77 (!) 151/69  Pulse: (!) 58 65  Resp:  18  Temp: (!) 97.4 F (36.3 C) 97.6 F (36.4 C)  SpO2:  97%    Intake/Output Summary (Last 24 hours) at 10/22/2018 1545 Last data filed at 10/22/2018 1500 Gross per 24 hour  Intake 520 ml  Output 550 ml  Net -30 ml   Filed Weights   10/22/18 1447  Weight: 62.8 kg    ROS: Review of Systems  Unable to perform ROS: Acuity of condition  Respiratory: Negative for shortness of breath.   Cardiovascular: Negative for chest pain.  Gastrointestinal: Negative for abdominal pain.   Exam: Physical Exam  HENT:  Nose: No mucosal edema.  Mouth/Throat: No oropharyngeal exudate or posterior oropharyngeal edema.  Eyes: Pupils are equal, round, and reactive to light. Conjunctivae, EOM and lids are normal.  Neck: No JVD present. Carotid bruit is not present. No edema present. No thyroid mass and no thyromegaly present.  Cardiovascular: S1 normal and S2 normal. Exam reveals no gallop.  No murmur heard. Pulses:      Dorsalis pedis pulses are 2+ on the right side and 2+ on the left side.  Respiratory: No respiratory distress. She has no wheezes. She has no rhonchi. She has no rales.  GI: Soft. Bowel sounds are normal. There is no abdominal tenderness.  Musculoskeletal:     Right ankle: She exhibits swelling.     Left ankle: She exhibits swelling.  Lymphadenopathy:    She has no cervical adenopathy.  Neurological: She is alert.  Able to straight leg raise  Skin: Skin is warm. No rash noted. Nails show no clubbing.  Psychiatric: She has a normal mood and affect.  Answers some questions      Data Reviewed: Basic Metabolic Panel: Recent Labs  Lab 10/21/18 1437  10/21/18 2119 10/22/18 0538  NA 136  --  139  K 4.2  --  3.6  CL 101  --  104  CO2 24  --  25  GLUCOSE 187*  --  151*  BUN 41*  --  40*  CREATININE 1.61* 1.52* 1.44*  CALCIUM 8.8*  --  8.4*   Liver Function Tests: Recent Labs  Lab 10/21/18 1437  AST 20  ALT 14  ALKPHOS 55  BILITOT 0.5  PROT 8.1  ALBUMIN 2.9*  CBC: Recent Labs  Lab 10/21/18 1437 10/21/18 2119 10/22/18 0538  WBC 15.2* 18.9* 11.8*  NEUTROABS 12.5*  --   --   HGB 9.7* 10.4* 8.6*  HCT 29.9* 33.5* 27.3*  MCV 97.4 101.8* 99.3  PLT 406* 349 286   CBG: Recent Labs  Lab 10/21/18 1430  GLUCAP 186*    Recent Results (from the past 240 hour(s))  Blood Culture (routine x 2)     Status: None (Preliminary result)   Collection Time: 10/21/18  2:30 PM   Specimen: BLOOD  Result Value Ref Range Status   Specimen Description   Final    BLOOD BLOOD LEFT HAND Performed at [facility], [address] [place], [place] [address]    Special Requests   Final    BOTTLES DRAWN AEROBIC AND ANAEROBIC Blood Culture adequate volume Performed at [facility], [address] [place], [place] [address]    Culture  Setup Time   Final    GRAM POSITIVE COCCI IN BOTH AEROBIC AND ANAEROBIC BOTTLES CRITICAL RESULT CALLED TO, READ BACK BY AND VERIFIED WITH: Wilkie AyeINA WANG AT [id number] 10/22/2018.PMF Performed at [facility], [address] [place], [place] [address]    Culture GRAM POSITIVE COCCI  Final   Report Status PENDING  Incomplete  Blood Culture ID Panel (Reflexed)     Status: Abnormal   Collection Time: 10/21/18  2:30 PM  Result Value Ref Range Status   Enterococcus species NOT DETECTED NOT DETECTED Final   Listeria monocytogenes NOT DETECTED NOT DETECTED Final   Staphylococcus species DETECTED (A) NOT DETECTED Final    Comment: Methicillin (oxacillin) resistant coagulase negative staphylococcus. Possible blood culture contaminant (unless isolated from more than one blood culture draw or clinical  case suggests pathogenicity). No antibiotic treatment is indicated for blood  culture contaminants. CRITICAL RESULT CALLED TO, READ BACK BY AND VERIFIED WITH: Wilkie AyeINA WANG AT [id number] 10/22/2018.PMF    Staphylococcus aureus (BCID) NOT DETECTED NOT DETECTED Final   Methicillin resistance DETECTED (A) NOT DETECTED Final    Comment: CRITICAL RESULT CALLED TO, READ BACK BY AND VERIFIED WITH: Wilkie AyeINA WANG AT [id number] 10/22/2018.PMF    Streptococcus species NOT DETECTED NOT DETECTED Final   Streptococcus agalactiae NOT DETECTED NOT DETECTED Final   Streptococcus pneumoniae NOT DETECTED NOT DETECTED Final   Streptococcus pyogenes NOT DETECTED NOT DETECTED Final   Acinetobacter baumannii NOT DETECTED NOT DETECTED Final   Enterobacteriaceae species NOT DETECTED NOT DETECTED Final   Enterobacter cloacae complex NOT DETECTED NOT DETECTED Final   Escherichia coli NOT DETECTED NOT DETECTED Final   Klebsiella oxytoca NOT DETECTED NOT DETECTED Final   Klebsiella pneumoniae NOT DETECTED NOT DETECTED Final   Proteus species NOT DETECTED NOT DETECTED Final   Serratia marcescens NOT DETECTED NOT DETECTED Final   Haemophilus influenzae NOT DETECTED NOT DETECTED Final   Neisseria meningitidis NOT DETECTED NOT DETECTED Final   Pseudomonas aeruginosa NOT DETECTED NOT DETECTED Final   Candida albicans NOT DETECTED NOT DETECTED Final   Candida glabrata NOT DETECTED NOT DETECTED Final   Candida krusei NOT DETECTED NOT DETECTED Final   Candida parapsilosis NOT DETECTED NOT DETECTED Final   Candida tropicalis NOT DETECTED NOT DETECTED Final    Comment: Performed at [facility], [address] [place], [place] [address]  Blood Culture (routine x 2)     Status: None (Preliminary result)   Collection Time: 10/21/18  2:37 PM   Specimen: BLOOD  Result Value Ref Range Status   Specimen Description BLOOD BLOOD RIGHT HAND  Final   Special Requests   Final    BOTTLES DRAWN AEROBIC AND ANAEROBIC Blood Culture adequate  volume   Culture   Final    NO GROWTH < 24 HOURS Performed at [facility], [address] [place], [place] [address]    Report Status PENDING  Incomplete  Urine culture     Status: Abnormal (Preliminary result)   Collection Time: 10/21/18  2:37 PM   Specimen: In/Out Cath Urine  Result Value Ref Range Status   Specimen Description   Final    IN/OUT CATH URINE Performed at [facility], [address] [place], [place] [address]    Special Requests   Final    NONE Performed at [facility], [address] [place], [place] [address]    Culture >=100,000 COLONIES/mL GRAM NEGATIVE RODS (A)  Final   Report Status PENDING  Incomplete  SARS Coronavirus 2 [facility] order, Performed in [facility] hospital lab) Nasopharyngeal Nasopharyngeal Swab     Status: None   Collection Time: 10/21/18  2:37 PM   Specimen: Nasopharyngeal Swab  Result Value Ref Range Status   SARS Coronavirus 2 NEGATIVE NEGATIVE Final    Comment: (NOTE) If result is NEGATIVE SARS-CoV-2 target nucleic acids are NOT DETECTED. The SARS-CoV-2 RNA is generally detectable in upper and lower  respiratory specimens during the acute phase of infection. The lowest  concentration of SARS-CoV-2 viral copies this assay can detect is 250  copies / mL. A negative result does not preclude SARS-CoV-2 infection  and should not be used as the sole basis for treatment or other  patient management decisions.  A negative result may occur with  improper specimen collection / handling, submission of specimen other  than nasopharyngeal swab, presence of viral mutation(s) within the  areas targeted by this assay, and inadequate number of viral copies  (<250 copies / mL). A negative result must be combined with clinical  observations, patient history, and epidemiological information. If result is POSITIVE SARS-CoV-2 target nucleic acids are DETECTED. The SARS-CoV-2 RNA is generally detectable in upper and  lower  respiratory specimens dur ing the acute phase of infection.  Positive  results are indicative of active infection with SARS-CoV-2.  Clinical  correlation with patient history and other diagnostic information is  necessary to determine patient infection status.  Positive results do  not rule out bacterial infection or co-infection with other viruses. If result is PRESUMPTIVE POSTIVE SARS-CoV-2 nucleic acids MAY BE PRESENT.   A presumptive positive result was obtained on the submitted specimen  and confirmed on repeat testing.  While 2019 novel coronavirus  (SARS-CoV-2) nucleic acids may be present in the submitted sample  additional confirmatory testing may be necessary for epidemiological  and / or clinical management purposes  to differentiate between  SARS-CoV-2 and other Sarbecovirus currently known to infect humans.  If clinically indicated additional testing with an alternate test  methodology [phone]) is advised. The SARS-CoV-2 RNA is generally  detectable in upper and lower respiratory sp ecimens during the acute  phase of infection. The expected result is Negative. Fact Sheet for Patients:  BoilerBrush.com.cy Fact Sheet for Healthcare Providers: https://pope.com/ This test is not yet approved or cleared by the [place] FDA and has been authorized for detection and/or diagnosis of SARS-CoV-2 by FDA under an Emergency Use Authorization (EUA).  This EUA will remain in effect (meaning this test can be used) for the duration of the COVID-19 declaration under Section 564(b)(1) of the Act, 21 U.S.C. section 360bbb-3(b)(1), unless the authorization is terminated or revoked sooner. Performed at [facility], [address] [place], [place] [address]   MRSA PCR Screening     Status: None   Collection Time: 10/22/18 12:03 AM   Specimen: Nasopharyngeal  Result Value Ref Range Status   MRSA by PCR NEGATIVE  NEGATIVE Final    Comment:        The GeneXpert MRSA Assay (FDA approved for NASAL specimens only), is one component of a comprehensive MRSA colonization surveillance program. It is not intended to diagnose MRSA infection nor to guide or monitor treatment for MRSA infections. Performed at [facility], [address] [place], [place] [address]      Studies: Dg Chest [facility] 1 View  Result Date: 10/21/2018 CLINICAL DATA:  Fevers EXAM: PORTABLE CHEST 1 VIEW  COMPARISON:  06/26/2018 FINDINGS: Cardiac shadow is at the upper limits of normal in size. Aortic calcifications are again noted and stable. Elevation of the right hemidiaphragm is noted. Multiple skin folds are noted over the left lung. No focal infiltrate or effusion is seen. IMPRESSION: No active disease. Electronically Signed   By: Alcide Clever M.D.   On: 10/21/2018 15:19    Scheduled Meds: . amLODipine  5 mg Oral Daily  . aspirin EC  81 mg Oral Daily  . cholecalciferol  2,000 Units Oral Daily  . docusate sodium  100 mg Oral QHS  . enoxaparin (LOVENOX) injection  30 mg Subcutaneous Q24H  . feeding supplement (ENSURE ENLIVE)  237 mL Oral BID BM  . levothyroxine  88 mcg Oral Q0600  . LORazepam  0.25 mg Oral BID  . mouth rinse  15 mL Mouth Rinse BID  . multivitamin with minerals  1 tablet Oral Daily  . potassium chloride  20 mEq Oral Daily   Continuous Infusions: . sodium chloride 50 mL/hr at 10/21/18 2123  . cefTRIAXone (ROCEPHIN)  IV 1 g (10/21/18 2220)    Assessment/Plan:  1. Clinical sepsis with fever, tachycardia and leukocytosis.  Acute cystitis.  Patient on Rocephin.  Blood culture likely a contamination.  Continue to watch for culture results. 2. Acute metabolic encephalopathy secondary to sepsis. 3. Essential hypertension.  Blood pressure was high then very low.  Can restart Norvasc half dose. 4. Dementia 5. Hypothyroidism on levothyroxine 6. Acute kidney injury on chronic kidney disease stage III.   Creatinine elevated to 1.61 and improved down to 1.44. 7. Lactic acidosis.  Gentle IV fluids. 8. Anemia.  Continue to watch hemoglobin.  Guaiac stools.  Code Status:     Code Status Orders  (From admission, onward)         Start     Ordered   10/21/18 2112  Do not attempt resuscitation (DNR)  Continuous    Question Answer Comment  In the event of cardiac or respiratory ARREST Do not call a "code blue"   In the event of cardiac or respiratory ARREST Do not perform Intubation, CPR, defibrillation or ACLS   In the event of cardiac or respiratory ARREST Use medication by any route, position, wound care, and other measures to relive pain and suffering. May use oxygen, suction and manual treatment of airway obstruction as needed for comfort.      10/21/18 2111        Code Status History    Date Active Date Inactive Code Status Order ID Comments User Context   08/22/2018 0016 08/24/2018 1456 DNR [id number]  Pearletha Alfred, NP ED   06/26/2018 0626 06/28/2018 1904 DNR [id number]  Hannah Beat, MD Inpatient   06/26/2018 0412 06/26/2018 0626 Full Code [id number]  Seals, Milas Kocher, NP ED   Advance Care Planning Activity    Advance Directive Documentation     Most Recent Value  Type of Advance Directive  Healthcare Power of Attorney, Living will, Out of facility DNR (pink MOST or yellow form)  Pre-existing out of facility DNR order (yellow form or pink MOST form)  Yellow form placed in chart (order not valid for inpatient use)  "MOST" Form in Place?  -     Family Communication: Spoke with daughter on the phone Disposition Plan: To be determined  Antibiotics:  Rocephin  Time spent: 28 minutes  Evamae Rowen [employer]

## 2018-10-22 NOTE — Evaluation (Signed)
Physical Therapy Evaluation Patient Details Name: Sabrina Burke MRN: [id number] DOB: 01-05-1928 Today's Date: 10/22/2018   History of Present Illness  Per MD: Pt is a 85 y.o. female from [place] c/o "slumping over in wheelchair" this afternoon. MD assessment includes: Sepsis due to UTI with tachycardia and elevated WBC, acute metabolic encephalopathy. PMH includes: dementia and an admission in early August 2020 for sepsis from [place] UTI.  Clinical Impression  Pt presented with deficits in strength, transfers, mobility, gait, balance, and activity tolerance. Pt was somewhat agitated 2/2 the process of removing her Purewick, did not report being in any pain. Pt required +2 mod A for mobility and +2 max A for attempted transfers. With cuing pt corrected her sitting balance and sat at EOB with BLE supported. Pt attempted to transfer from bed to recliner and rose from the surface of the bed, but did not come to full standing or shift weight toward the recliner. Per staff at [facility], pt presents with a functional status decline for her PLOF. Pt will benefit from [facility] upon discharge to safely address above deficits for decreased caregiver assistance and eventual return to PLOF.     Follow Up Recommendations Home health PT    Equipment Recommendations  None recommended by PT;Other (comment)(Pt has W/C for facility mobility)    Recommendations for Other Services       Precautions / Restrictions Precautions Precautions: Fall Precaution Comments: High fall risk Restrictions Weight Bearing Restrictions: No      Mobility  Bed Mobility Overal bed mobility: Needs Assistance Bed Mobility: Rolling;Supine to Sit;Sit to Supine Rolling: Mod assist;+2 for physical assistance   Supine to sit: Mod assist;+2 for physical assistance Sit to supine: Mod assist;+2 for physical assistance   General bed mobility comments: Pt not able to follow  cues  Transfers Overall transfer level: Needs assistance Equipment used: 2 person hand held assist Transfers: Sit to/from Stand Sit to Stand: +2 physical assistance;Max assist         General transfer comment: Pt unweighted off bed surface with +2 max A, but too weak to safely attempt stand pivot transfer to chair.  Ambulation/Gait             General Gait Details: N/A  Stairs            Wheelchair Mobility    Modified Rankin (Stroke Patients Only)       Balance Overall balance assessment: Needs assistance Sitting-balance support: Single extremity supported;Feet supported Sitting balance-Leahy Scale: Fair Sitting balance - Comments: Pt needed cuing to hold self up Postural control: Left lateral lean Standing balance support: Bilateral upper extremity supported Standing balance-Leahy Scale: Poor                               Pertinent Vitals/Pain No/denies pain     Home Living Family/patient expects to be discharged to:: Skilled nursing facility(Memory Care)                      Prior Function Level of Independence: Needs assistance   Gait / Transfers Assistance Needed: W/C ambulation status, requires mod A for pivot transfers W/C<>bed, mod-max A for bed mobility  ADL's / Homemaking Assistance Needed: Total care        Hand Dominance        Extremity/Trunk Assessment   Upper Extremity Assessment Upper Extremity Assessment: Generalized weakness  Lower Extremity Assessment Lower Extremity Assessment: Generalized weakness    Cervical / Trunk Assessment Cervical / Trunk Assessment: Kyphotic  Communication   Communication: Other (comment)  Cognition Arousal/Alertness: Awake/alert Behavior During Therapy: Anxious Overall Cognitive Status: No family/caregiver present to determine baseline cognitive functioning                                        General Comments      Exercises Total Joint  Exercises Heel Slides: AROM;Both;5 reps;10 reps Hip ABduction/ADduction: AAROM;Both;5 reps Straight Leg Raises: AAROM;Both;5 reps Knee Flexion: AAROM;Both;5 reps Other Exercises Other Exercises: Pt completed static sitting on EOB with weighshifting   Assessment/Plan    PT Assessment Patient needs continued PT services  PT Problem List Decreased strength;Decreased activity tolerance;Decreased balance;Decreased mobility;Decreased coordination       PT Treatment Interventions      PT Goals (Current goals can be found in the Care Plan section)  Acute Rehab PT Goals Patient Stated Goal: Return to PLOF including ability to aid in pivot transfer bed<>W/C PT Goal Formulation: Patient unable to participate in goal setting Time For Goal Achievement: 11/04/18 Potential to Achieve Goals: Fair    Frequency Min 2X/week   Barriers to discharge        Co-evaluation               AM-PAC PT "6 Clicks" Mobility  Outcome Measure Help needed turning from your back to your side while in a flat bed without using bedrails?: A Lot Help needed moving from lying on your back to sitting on the side of a flat bed without using bedrails?: A Lot Help needed moving to and from a bed to a chair (including a wheelchair)?: A Lot Help needed standing up from a chair using your arms (e.g., wheelchair or bedside chair)?: A Lot Help needed to walk in hospital room?: A Lot Help needed climbing 3-5 steps with a railing? : A Lot 6 Click Score: 12    End of Session Equipment Utilized During Treatment: Gait belt Activity Tolerance: Patient limited by fatigue Patient left: in bed;with call bell/phone within reach;with bed alarm set;with SCD's reapplied Nurse Communication: Mobility status PT Visit Diagnosis: Unsteadiness on feet (R26.81);Muscle weakness (generalized) (M62.81)    Time: 1749-4496 PT Time Calculation (min) (ACUTE ONLY): 29 min   Charges:             Leonette Most "Gus" Jaylanni Eltringham,  SPT  10/22/18, 5:11 PM

## 2018-10-22 NOTE — TOC Initial Note (Addendum)
Transition of Care [facility]) - Initial/Assessment Note    Patient Details  Name: Sabrina Burke MRN: [id number] Date of Birth: 10-11-1927  Transition of Care [facility]) CM/SW Contact:    Margarito Liner, LCSW Phone Number: 10/22/2018, 12:38 PM  Clinical Narrative: Patient only oriented to self. Patient is a resident at [facility] on their memory care unit. Per MD, there is a possibility she will discharge over the weekend. CSW spoke with [place] at [facility] and she confirmed they would be able to accept her back over the weekend if stable. Patient will need a new COVID test prior to discharge. Patient will have to return by EMS if discharging over the weekend.         1:39 pm: CSW updated daughter.      3:43 pm: Tried [facility] to see if they had a preferred home health provider. No answer.       Expected Discharge Plan: Assisted Living Barriers to Discharge: Continued Medical Work up   Patient Goals and CMS Choice Patient states their goals for this hospitalization and ongoing recovery are:: Patient is not fully oriented.   Choice offered to / list presented to : NA  Expected Discharge Plan and Services Expected Discharge Plan: Assisted Living     Post Acute Care Choice: NA Living arrangements for the past 2 months: Assisted Living Facility                                      Prior Living Arrangements/Services Living arrangements for the past 2 months: Assisted Living Facility Lives with:: Facility Resident Patient language and need for interpreter reviewed:: Yes Do you feel safe going back to the place where you live?: Yes      Need for Family Participation in Patient Care: Yes (Comment) Care giver support system in place?: Yes (comment)   Criminal Activity/Legal Involvement Pertinent to Current Situation/Hospitalization: No - Comment as needed  Activities of Daily Living Home Assistive Devices/Equipment: Wheelchair ADL Screening (condition  at time of admission) Patient's cognitive ability adequate to safely complete daily activities?: No Is the patient deaf or have difficulty hearing?: No Does the patient have difficulty seeing, even when wearing glasses/contacts?: No Does the patient have difficulty concentrating, remembering, or making decisions?: Yes Patient able to express need for assistance with ADLs?: No Does the patient have difficulty dressing or bathing?: Yes Independently performs ADLs?: No Communication: Needs assistance Is this a change from baseline?: Pre-admission baseline Dressing (OT): Needs assistance Is this a change from baseline?: Pre-admission baseline Grooming: Needs assistance Is this a change from baseline?: Pre-admission baseline Feeding: Needs assistance Is this a change from baseline?: Pre-admission baseline Bathing: Dependent Is this a change from baseline?: Pre-admission baseline Toileting: Dependent Is this a change from baseline?: Pre-admission baseline In/Out Bed: Needs assistance Is this a change from baseline?: Pre-admission baseline Walks in Home: Dependent Is this a change from baseline?: Pre-admission baseline Does the patient have difficulty walking or climbing stairs?: Yes Weakness of Legs: Both Weakness of Arms/Hands: Both  Permission Sought/Granted Permission sought to share information with : Photographer granted to share info w AGENCY: [facility]        Emotional Assessment Appearance:: Appears stated age Attitude/Demeanor/Rapport: Unable to Assess Affect (typically observed): Unable to Assess Orientation: : Oriented to Self Alcohol / Substance Use: Never Used Psych  Involvement: No (comment)  Admission diagnosis:  Acute cystitis without hematuria [N30.00] Dementia without behavioral disturbance, unspecified dementia type ([place]) [F03.90] Sepsis without acute organ dysfunction, due to unspecified organism [facility])  [A41.9] Patient Active Problem List   Diagnosis Date Noted  . Sepsis ([place]) 06/26/2018   PCP:  Odessa Fleming, NP Pharmacy:  No Pharmacies Listed    Social Determinants of Health (SDOH) Interventions    Readmission Risk Interventions No flowsheet data found.

## 2018-10-23 LAB — CBC
HCT: 26.1 % — ABNORMAL LOW (ref 36.0–46.0)
Hemoglobin: 8.2 g/dL — ABNORMAL LOW (ref 12.0–15.0)
MCH: 31.4 pg (ref 26.0–34.0)
MCHC: 31.4 g/dL (ref 30.0–36.0)
MCV: 100 fL (ref 80.0–100.0)
Platelets: 300 10*3/uL (ref 150–400)
RBC: 2.61 MIL/uL — ABNORMAL LOW (ref 3.87–5.11)
RDW: 11.7 % (ref 11.5–15.5)
WBC: 10.1 10*3/uL (ref 4.0–10.5)
nRBC: 0 % (ref 0.0–0.2)

## 2018-10-23 LAB — BASIC METABOLIC PANEL
Anion gap: 8 (ref 5–15)
BUN: 32 mg/dL — ABNORMAL HIGH (ref 8–23)
CO2: 26 mmol/L (ref 22–32)
Calcium: 8.4 mg/dL — ABNORMAL LOW (ref 8.9–10.3)
Chloride: 107 mmol/L (ref 98–111)
Creatinine, Ser: 1.42 mg/dL — ABNORMAL HIGH (ref 0.44–1.00)
GFR calc Af Amer: 38 mL/min — ABNORMAL LOW (ref >60)
GFR calc non Af Amer: 33 mL/min — ABNORMAL LOW (ref >60)
Glucose, Bld: 109 mg/dL — ABNORMAL HIGH (ref 70–99)
Potassium: 3.7 mmol/L (ref 3.5–5.1)
Sodium: 141 mmol/L (ref 135–145)

## 2018-10-23 LAB — URINE CULTURE: Culture: >=100000 — AB

## 2018-10-23 LAB — LACTIC ACID, PLASMA
Lactic Acid, Venous: 0.9 mmol/L (ref 0.5–1.9)
Lactic Acid, Venous: 1.3 mmol/L (ref 0.5–1.9)

## 2018-10-23 MED ORDER — CIPROFLOXACIN HCL 500 MG PO TABS
500.0000 mg | ORAL_TABLET | Freq: Every day | ORAL | Status: DC
  Administered 2018-10-23: 500 mg via ORAL
  Filled 2018-10-23: qty 1

## 2018-10-23 MED ORDER — CEFDINIR 300 MG PO CAPS
300.0000 mg | ORAL_CAPSULE | Freq: Two times a day (BID) | ORAL | Status: DC
  Filled 2018-10-23 (×2): qty 1
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
## 2018-10-23 NOTE — Progress Notes (Signed)
Patient ID: Sabrina Burke, female   DOB: 1927-05-24, 85 y.o.   MRN: [id number]  [employer] PROGRESS NOTE  Sabrina Burke TIR:[id number] DOB: [birth year] DOA: 10/21/2018 PCP: Odessa Fleming, NP  HPI/Subjective:   Patient is a poor historian from dementia, febrile last night no overnight incidents per RNs report Objective: Vitals:   10/23/18 0800 10/23/18 1405  BP: (!) 172/57 (!) 148/55  Pulse: 70 71  Resp:  18  Temp: 98.1 F (36.7 C) 98.4 F (36.9 C)  SpO2: 97% 97%    Intake/Output Summary (Last 24 hours) at 10/23/2018 1506 Last data filed at 10/23/2018 1426 Gross per 24 hour  Intake 2020.49 ml  Output 200 ml  Net 1820.49 ml   Filed Weights   10/22/18 1447  Weight: 62.8 kg    ROS: Review of Systems  Unable to perform ROS: Acuity of condition  Respiratory: Negative for shortness of breath.   Cardiovascular: Negative for chest pain.  Gastrointestinal: Negative for abdominal pain.   Exam: Physical Exam  HENT:  Nose: No mucosal edema.  Mouth/Throat: No oropharyngeal exudate or posterior oropharyngeal edema.  Eyes: Pupils are equal, round, and reactive to light. Conjunctivae, EOM and lids are normal.  Neck: No JVD present. Carotid bruit is not present. No edema present. No thyroid mass and no thyromegaly present.  Cardiovascular: S1 normal and S2 normal. Exam reveals no gallop.  No murmur heard. Pulses:      Dorsalis pedis pulses are 2+ on the right side and 2+ on the left side.  Respiratory: No respiratory distress. She has no wheezes. She has no rhonchi. She has no rales.  GI: Soft. Bowel sounds are normal. There is no abdominal tenderness.  Musculoskeletal:     Right ankle: She exhibits swelling.     Left ankle: She exhibits swelling.  Lymphadenopathy:    She has no cervical adenopathy.  Neurological: She is alert.  Able to straight leg raise  Skin: Skin is warm. No rash noted. Nails show no clubbing.  Psychiatric: She has a normal mood and affect.   Answers some questions      Data Reviewed: Basic Metabolic Panel: Recent Labs  Lab 10/21/18 1437 10/21/18 2119 10/22/18 0538 10/23/18 0450  NA 136  --  139 141  K 4.2  --  3.6 3.7  CL 101  --  104 107  CO2 24  --  25 26  GLUCOSE 187*  --  151* 109*  BUN 41*  --  40* 32*  CREATININE 1.61* 1.52* 1.44* 1.42*  CALCIUM 8.8*  --  8.4* 8.4*   Liver Function Tests: Recent Labs  Lab 10/21/18 1437  AST 20  ALT 14  ALKPHOS 55  BILITOT 0.5  PROT 8.1  ALBUMIN 2.9*  CBC: Recent Labs  Lab 10/21/18 1437 10/21/18 2119 10/22/18 0538 10/23/18 0450  WBC 15.2* 18.9* 11.8* 10.1  NEUTROABS 12.5*  --   --   --   HGB 9.7* 10.4* 8.6* 8.2*  HCT 29.9* 33.5* 27.3* 26.1*  MCV 97.4 101.8* 99.3 100.0  PLT 406* 349 286 300   CBG: Recent Labs  Lab 10/21/18 1430  GLUCAP 186*    Recent Results (from the past 240 hour(s))  Blood Culture (routine x 2)     Status: Abnormal (Preliminary result)   Collection Time: 10/21/18  2:30 PM   Specimen: BLOOD  Result Value Ref Range Status   Specimen Description   Final    BLOOD BLOOD LEFT HAND Performed at [facility], [address] [place], [place] [address]    Special Requests   Final    BOTTLES DRAWN AEROBIC AND ANAEROBIC Blood Culture adequate volume Performed at [facility], [address] [place], [place] [address]    Culture  Setup Time   Final    GRAM POSITIVE COCCI IN BOTH AEROBIC AND ANAEROBIC BOTTLES CRITICAL RESULT CALLED TO, READ BACK BY AND VERIFIED WITH: Wilkie AyeINA WANG AT [id number] 10/22/2018.PMF    Culture (A)  Final    STAPHYLOCOCCUS SPECIES (COAGULASE NEGATIVE) THE SIGNIFICANCE OF ISOLATING THIS ORGANISM FROM A SINGLE SET OF BLOOD CULTURES WHEN MULTIPLE SETS ARE DRAWN IS UNCERTAIN. PLEASE NOTIFY THE MICROBIOLOGY DEPARTMENT WITHIN ONE WEEK IF SPECIATION AND SENSITIVITIES ARE REQUIRED. Performed at [facility], [address] [place], [place] [address]    Report Status PENDING  Incomplete  Blood  Culture ID Panel (Reflexed)     Status: Abnormal   Collection Time: 10/21/18  2:30 PM  Result Value Ref Range Status   Enterococcus species NOT DETECTED NOT DETECTED Final   Listeria monocytogenes NOT DETECTED NOT DETECTED Final   Staphylococcus species DETECTED (A) NOT DETECTED Final    Comment: Methicillin (oxacillin) resistant coagulase negative staphylococcus. Possible blood culture contaminant (unless isolated from more than one blood culture draw or clinical case suggests pathogenicity). No antibiotic treatment is indicated for blood  culture contaminants. CRITICAL RESULT CALLED TO, READ BACK BY AND VERIFIED WITH: Wilkie AyeINA WANG AT [phone] 10/22/2018.PMF    Staphylococcus aureus (BCID) NOT DETECTED NOT DETECTED Final   Methicillin resistance DETECTED (A) NOT DETECTED Final    Comment: CRITICAL RESULT CALLED TO, READ BACK BY AND VERIFIED WITH: Wilkie AyeINA WANG AT [phone] 10/22/2018.PMF    Streptococcus species NOT DETECTED NOT DETECTED Final   Streptococcus agalactiae NOT DETECTED NOT DETECTED Final   Streptococcus pneumoniae NOT DETECTED NOT DETECTED Final   Streptococcus pyogenes NOT DETECTED NOT DETECTED Final   Acinetobacter baumannii NOT DETECTED NOT DETECTED Final   Enterobacteriaceae species NOT DETECTED NOT DETECTED Final   Enterobacter cloacae complex NOT DETECTED NOT DETECTED Final   Escherichia coli NOT DETECTED NOT DETECTED Final   Klebsiella oxytoca NOT DETECTED NOT DETECTED Final   Klebsiella pneumoniae NOT DETECTED NOT DETECTED Final   Proteus species NOT DETECTED NOT DETECTED Final   Serratia marcescens NOT DETECTED NOT DETECTED Final   Haemophilus influenzae NOT DETECTED NOT DETECTED Final   Neisseria meningitidis NOT DETECTED NOT DETECTED Final   Pseudomonas aeruginosa NOT DETECTED NOT DETECTED Final   Candida albicans NOT DETECTED NOT DETECTED Final   Candida glabrata NOT DETECTED NOT DETECTED Final   Candida krusei NOT DETECTED NOT DETECTED Final   Candida parapsilosis NOT  DETECTED NOT DETECTED Final   Candida tropicalis NOT DETECTED NOT DETECTED Final    Comment: Performed at [facility], [address] [place], [place] [address]  Blood Culture (routine x 2)     Status: None (Preliminary result)   Collection Time: 10/21/18  2:37 PM   Specimen: BLOOD  Result Value Ref Range Status   Specimen Description BLOOD BLOOD RIGHT HAND  Final   Special Requests   Final    BOTTLES DRAWN AEROBIC AND ANAEROBIC Blood Culture adequate volume   Culture   Final    NO GROWTH 2 DAYS Performed at [facility], [address] [place], [place] [address]    Report Status PENDING  Incomplete  Urine culture     Status: Abnormal   Collection Time: 10/21/18  2:37 PM  Specimen: In/Out Cath Urine  Result Value Ref Range Status   Specimen Description   Final    IN/OUT CATH URINE Performed at [facility], [address] [place], [place] [address]    Special Requests   Final    NONE Performed at [facility], [address] [place], [place] [address]    Culture >=100,000 COLONIES/mL PSEUDOMONAS AERUGINOSA (A)  Final   Report Status 10/23/2018 FINAL  Final   Organism ID, Bacteria PSEUDOMONAS AERUGINOSA (A)  Final      Susceptibility   Pseudomonas aeruginosa - MIC*    CEFTAZIDIME <=1 SENSITIVE Sensitive     CIPROFLOXACIN <=0.25 SENSITIVE Sensitive     GENTAMICIN <=1 SENSITIVE Sensitive     IMIPENEM 1 SENSITIVE Sensitive     PIP/TAZO <=4 SENSITIVE Sensitive     CEFEPIME <=1 SENSITIVE Sensitive     * >=100,000 COLONIES/mL PSEUDOMONAS AERUGINOSA  SARS Coronavirus 2 [facility] order, Performed in [facility] hospital lab) Nasopharyngeal Nasopharyngeal Swab     Status: None   Collection Time: 10/21/18  2:37 PM   Specimen: Nasopharyngeal Swab  Result Value Ref Range Status   SARS Coronavirus 2 NEGATIVE NEGATIVE Final    Comment: (NOTE) If result is NEGATIVE SARS-CoV-2 target nucleic acids are NOT DETECTED. The SARS-CoV-2 RNA is  generally detectable in upper and lower  respiratory specimens during the acute phase of infection. The lowest  concentration of SARS-CoV-2 viral copies this assay can detect is 250  copies / mL. A negative result does not preclude SARS-CoV-2 infection  and should not be used as the sole basis for treatment or other  patient management decisions.  A negative result may occur with  improper specimen collection / handling, submission of specimen other  than nasopharyngeal swab, presence of viral mutation(s) within the  areas targeted by this assay, and inadequate number of viral copies  (<250 copies / mL). A negative result must be combined with clinical  observations, patient history, and epidemiological information. If result is POSITIVE SARS-CoV-2 target nucleic acids are DETECTED. The SARS-CoV-2 RNA is generally detectable in upper and lower  respiratory specimens dur ing the acute phase of infection.  Positive  results are indicative of active infection with SARS-CoV-2.  Clinical  correlation with patient history and other diagnostic information is  necessary to determine patient infection status.  Positive results do  not rule out bacterial infection or co-infection with other viruses. If result is PRESUMPTIVE POSTIVE SARS-CoV-2 nucleic acids MAY BE PRESENT.   A presumptive positive result was obtained on the submitted specimen  and confirmed on repeat testing.  While 2019 novel coronavirus  (SARS-CoV-2) nucleic acids may be present in the submitted sample  additional confirmatory testing may be necessary for epidemiological  and / or clinical management purposes  to differentiate between  SARS-CoV-2 and other Sarbecovirus currently known to infect humans.  If clinically indicated additional testing with an alternate test  methodology [phone]) is advised. The SARS-CoV-2 RNA is generally  detectable in upper and lower respiratory sp ecimens during the acute  phase of  infection. The expected result is Negative. Fact Sheet for Patients:  BoilerBrush.com.cy Fact Sheet for Healthcare Providers: https://pope.com/ This test is not yet approved or cleared by the [place] FDA and has been authorized for detection and/or diagnosis of SARS-CoV-2 by FDA under an Emergency Use Authorization (EUA).  This EUA will remain in effect (meaning this test can be used) for the duration of the COVID-19 declaration under Section  564(b)(1) of the Act, 21 U.S.C. section 360bbb-3(b)(1), unless the authorization is terminated or revoked sooner. Performed at [facility], [address] [place], [place] [address]   MRSA PCR Screening     Status: None   Collection Time: 10/22/18 12:03 AM   Specimen: Nasopharyngeal  Result Value Ref Range Status   MRSA by PCR NEGATIVE NEGATIVE Final    Comment:        The GeneXpert MRSA Assay (FDA approved for NASAL specimens only), is one component of a comprehensive MRSA colonization surveillance program. It is not intended to diagnose MRSA infection nor to guide or monitor treatment for MRSA infections. Performed at [facility], [address] [place], [place] [address]      Studies: Dg Chest [facility] 1 View  Result Date: 10/21/2018 CLINICAL DATA:  Fevers EXAM: PORTABLE CHEST 1 VIEW COMPARISON:  06/26/2018 FINDINGS: Cardiac shadow is at the upper limits of normal in size. Aortic calcifications are again noted and stable. Elevation of the right hemidiaphragm is noted. Multiple skin folds are noted over the left lung. No focal infiltrate or effusion is seen. IMPRESSION: No active disease. Electronically Signed   By: Alcide Clever M.D.   On: 10/21/2018 15:19    Scheduled Meds: . amLODipine  5 mg Oral Daily  . aspirin EC  81 mg Oral Daily  . cholecalciferol  2,000 Units Oral Daily  . docusate sodium  100 mg Oral QHS  . enoxaparin (LOVENOX) injection  30 mg  Subcutaneous Q24H  . feeding supplement (ENSURE ENLIVE)  237 mL Oral BID BM  . levothyroxine  88 mcg Oral Q0600  . LORazepam  0.25 mg Oral BID  . mouth rinse  15 mL Mouth Rinse BID  . multivitamin with minerals  1 tablet Oral Daily  . nystatin  5 mL Oral QID  . potassium chloride  20 mEq Oral Daily   Continuous Infusions: . sodium chloride 50 mL/hr at 10/23/18 1418  . cefTRIAXone (ROCEPHIN)  IV Stopped (10/22/18 2315)    Assessment/Plan:  1.  sepsis with fever, tachycardia and leukocytosis.  Acute cystitis.  Patient on Rocephin.  Blood culture likely a contamination.  Urine culture with pseudomonas aeruginosa pansensitive including cefepime, Zosyn and ciprofloxacin will de-escalate the antibiotics leukocytosis resolved 2. Acute metabolic encephalopathy secondary to sepsis. 3. Essential hypertension.  Blood pressure was high then very low.  Can restart Norvasc half dose. 4. Dementia 5. Hypothyroidism on levothyroxine 6. Acute kidney injury on chronic kidney disease stage III.  Creatinine elevated to 1.61 and improved down to 1.44.  Baseline creatinine seems to be around 1.2-1.3 check BMP in a.m. 7. Lactic acidosis.  Gentle IV fluids. 8. Anemia.  Continue to watch hemoglobin.  Guaiac stools. 9. Generalized weakness PT is recommending home health PT disposition [facility] with home health PT  Code Status:     Code Status Orders  (From admission, onward)         Start     Ordered   10/21/18 2112  Do not attempt resuscitation (DNR)  Continuous    Question Answer Comment  In the event of cardiac or respiratory ARREST Do not call a "code blue"   In the event of cardiac or respiratory ARREST Do not perform Intubation, CPR, defibrillation or ACLS   In the event of cardiac or respiratory ARREST Use medication by any route, position, wound care, and other measures to relive pain and suffering. May use oxygen, suction and manual treatment  of airway obstruction  as needed for comfort.      10/21/18 2111        Code Status History    Date Active Date Inactive Code Status Order ID Comments User Context   08/22/2018 0016 08/24/2018 1456 DNR [id number]  Pearletha Alfred, NP ED   06/26/2018 0626 06/28/2018 1904 DNR [id number]  Hannah Beat, MD Inpatient   06/26/2018 0412 06/26/2018 0626 Full Code [id number]  Seals, Milas Kocher, NP ED   Advance Care Planning Activity    Advance Directive Documentation     Most Recent Value  Type of Advance Directive  Healthcare Power of Attorney, Living will, Out of facility DNR (pink MOST or yellow form)  Pre-existing out of facility DNR order (yellow form or pink MOST form)  Yellow form placed in chart (order not valid for inpatient use)  "MOST" Form in Place?  -     Family Communication: Spoke with daughter on the phone Disposition Plan: To be determined  Antibiotics:  Rocephin  Time spent: 33 minutes  Sabrina Burke  [employer]

## 2018-10-24 LAB — CULTURE, BLOOD (ROUTINE X 2): Special Requests: ADEQUATE

## 2018-10-24 LAB — CBC
HCT: 26.2 % — ABNORMAL LOW (ref 36.0–46.0)
Hemoglobin: 8.4 g/dL — ABNORMAL LOW (ref 12.0–15.0)
MCH: 32.1 pg (ref 26.0–34.0)
MCHC: 32.1 g/dL (ref 30.0–36.0)
MCV: 100 fL (ref 80.0–100.0)
Platelets: 293 10*3/uL (ref 150–400)
RBC: 2.62 MIL/uL — ABNORMAL LOW (ref 3.87–5.11)
RDW: 11.7 % (ref 11.5–15.5)
WBC: 7.3 10*3/uL (ref 4.0–10.5)
nRBC: 0 % (ref 0.0–0.2)

## 2018-10-24 LAB — BASIC METABOLIC PANEL
Anion gap: 5 (ref 5–15)
BUN: 32 mg/dL — ABNORMAL HIGH (ref 8–23)
CO2: 26 mmol/L (ref 22–32)
Calcium: 8.4 mg/dL — ABNORMAL LOW (ref 8.9–10.3)
Chloride: 108 mmol/L (ref 98–111)
Creatinine, Ser: 1.31 mg/dL — ABNORMAL HIGH (ref 0.44–1.00)
GFR calc Af Amer: 42 mL/min — ABNORMAL LOW (ref >60)
GFR calc non Af Amer: 36 mL/min — ABNORMAL LOW (ref >60)
Glucose, Bld: 102 mg/dL — ABNORMAL HIGH (ref 70–99)
Potassium: 3.8 mmol/L (ref 3.5–5.1)
Sodium: 139 mmol/L (ref 135–145)

## 2018-10-24 MED ORDER — NYSTATIN 100000 UNIT/ML MT SUSP: 5.0000 mL | Freq: Four times a day (QID) | OROMUCOSAL | 0 refills | Status: AC

## 2018-10-24 MED ORDER — LORAZEPAM 0.5 MG PO TABS: 0.2500 mg | ORAL_TABLET | Freq: Two times a day (BID) | ORAL | 0 refills | Status: AC

## 2018-10-24 MED ORDER — POLYETHYLENE GLYCOL 3350 17 G PO PACK: 17.0000 g | PACK | Freq: Every day | ORAL | 0 refills | Status: AC | PRN

## 2018-10-24 MED ORDER — CIPROFLOXACIN HCL 500 MG PO TABS: 500.0000 mg | ORAL_TABLET | Freq: Every day | ORAL | 0 refills | Status: DC

## 2018-10-24 NOTE — Progress Notes (Signed)
Pt has been discharged to [facility].  Copy of AVS placed in discharge packet for receiving facility. Called report to [place], [employer].  Transported by EMS.

## 2018-10-24 NOTE — Discharge Summary (Signed)
[facility] [place] at [facility]   PATIENT NAME: Sabrina Burke    MR#:  [id number]  DATE OF BIRTH:  10/01/1927  DATE OF ADMISSION:  10/21/2018 ADMITTING PHYSICIAN: Adrian Saran, MD  DATE OF DISCHARGE:  10/24/18   PRIMARY CARE PHYSICIAN: Devoria Glassing, NP    ADMISSION DIAGNOSIS:  Acute cystitis without hematuria [N30.00] Dementia without behavioral disturbance, unspecified dementia type (HCC) [F03.90] Sepsis without acute organ dysfunction, due to unspecified organism (HCC) [A41.9]  DISCHARGE DIAGNOSIS:  Active Problems:   Sepsis (HCC) UTI   SECONDARY DIAGNOSIS:   Past Medical History:  Diagnosis Date  . Dementia (HCC)   . Hypertension     HOSPITAL COURSE:  HPI  Sabrina Burke  is a 85 y.o. female with a known history of dementia and admission in early August 2020 for sepsis from [place] UTI who presents from [facility], c/o " slumping over in wheelchair". She is ambulatory at baseline and interactive but was noted to be slumped over in her chair this afternoon.  She is treated for sepsis in ED and found to have UTI.  Patient with dementia unable to provide HPI. HPI from ED MD.  Hospital course :   1.  sepsis with fever, tachycardia and leukocytosis.  Acute cystitis.  Patient on Rocephin.  Blood culture likely a contamination.  Urine culture with pseudomonas aeruginosa pansensitive including cefepime, Zosyn and ciprofloxacin will de-escalate the antibiotics to p.o. ciprofloxacin, leukocytosis resolved 2. Acute metabolic encephalopathy secondary to sepsis.-Patient seems to be at her baseline from underlying dementia 3. Essential hypertension.  Blood pressure was high then very low.  Can restart Norvasc half dose. 4. Dementia-chronic 5. Hypothyroidism on levothyroxine 6. Acute kidney injury on chronic kidney disease stage III.  Creatinine elevated to 1.61 and improved down to 1.44-1.3 at her baseline.  Baseline creatinine seems to be  around 1.2-1.3 check  7. Lactic acidosis.  Gentle IV fluids provided. 8. Anemia.  Continue to watch hemoglobin.  Guaiac stools ordered but not collected 9. Generalized weakness PT is recommending home health PT disposition [facility] with home health PT 10. Thrush-nystatin swish and swallow  DISCHARGE CONDITIONS:   FAIR  CONSULTS OBTAINED:     PROCEDURES  None   DRUG ALLERGIES:  No Known Allergies  DISCHARGE MEDICATIONS:   Allergies as of 10/24/2018   No Known Allergies     Medication List    STOP taking these medications   atenolol 25 MG tablet Commonly known as: TENORMIN   cephALEXin 250 MG capsule Commonly known as: KEFLEX     TAKE these medications   acetaminophen 325 MG tablet Commonly known as: TYLENOL Take 325 mg by mouth every 6 (six) hours as needed for mild pain or fever.   alum & mag hydroxide-simeth 200-200-20 MG/5ML suspension Commonly known as: MAALOX/MYLANTA Take 30 mLs by mouth as needed for indigestion or heartburn (max 4 doses in 24 hours).   amLODipine 10 MG tablet Commonly known as: NORVASC Take 1 tablet (10 mg total) by mouth daily.   aspirin 81 MG EC tablet Take 1 tablet (81 mg total) by mouth daily.   bisacodyl 5 MG EC tablet Commonly known as: DULCOLAX Take 5 mg by mouth every other day as needed for moderate constipation.   ciprofloxacin 500 MG tablet Commonly known as: CIPRO Take 1 tablet (500 mg total) by mouth daily at 6 PM.   DAILY VITE PO Take 1 tablet by mouth daily.   docusate sodium  100 MG capsule Commonly known as: COLACE Take 100 mg by mouth at bedtime.   feeding supplement (ENSURE ENLIVE) Liqd Take 237 mLs by mouth 2 (two) times daily between meals.   furosemide 20 MG tablet Commonly known as: LASIX Take 20 mg by mouth daily. What changed: Another medication with the same name was removed. Continue taking this medication, and follow the directions you see here.   guaiFENesin 100  MG/5ML liquid Commonly known as: ROBITUSSIN Take 200 mg by mouth every 6 (six) hours as needed for cough.   levothyroxine 88 MCG tablet Commonly known as: SYNTHROID Take 88 mcg by mouth daily before breakfast.   loperamide 2 MG capsule Commonly known as: IMODIUM Take 2 mg by mouth as needed for diarrhea or loose stools (max 8 doses in 24 hours).   LORazepam 0.5 MG tablet Commonly known as: ATIVAN Take 0.25 mg by mouth 2 (two) times daily as needed for anxiety.   LORazepam 0.5 MG tablet Commonly known as: ATIVAN Take 0.5 tablets (0.25 mg total) by mouth 2 (two) times daily. Takes at 1400 and 2100   magnesium hydroxide 400 MG/5ML suspension Commonly known as: MILK OF MAGNESIA Take 30 mLs by mouth daily as needed for mild constipation.   neomycin-bacitracin-polymyxin ointment Commonly known as: NEOSPORIN Apply 1 application topically as needed for wound care.   nystatin 100000 UNIT/ML suspension Commonly known as: MYCOSTATIN Take 5 mLs (500,000 Units total) by mouth 4 (four) times daily for 6 days.   polyethylene glycol 17 g packet Commonly known as: MIRALAX / GLYCOLAX Take 17 g by mouth daily as needed for mild constipation.   potassium chloride 20 MEQ packet Commonly known as: KLOR-CON Take 20 mEq by mouth daily.   Vitamin D3 50 MCG (2000 UT) Tabs Take 2,000 Units by mouth daily.        DISCHARGE INSTRUCTIONS:   F/u with pcp in 3 days   DIET:  Dysphagia level 1 (PUREE) w/ condiments/gravy; w/ Thin liquids. General aspiration and Reflux precautions; Feeding Support and Supervision at meals; Pills CRUSHED in Puree for safer swallowing. Dietician f/u for Drink Supplement support  DISCHARGE CONDITION:  Fair  ACTIVITY:  Activity as tolerated per PT -HHPT   OXYGEN:  Home Oxygen: No.   Oxygen Delivery: room air  DISCHARGE LOCATION:  Assisted living facility at [facility] house  If you experience worsening of your admission symptoms, develop shortness of  breath, life threatening emergency, suicidal or homicidal thoughts you must seek medical attention immediately by calling 911 or calling your MD immediately  if symptoms less severe.  You Must read complete instructions/literature along with all the possible adverse reactions/side effects for all the Medicines you take and that have been prescribed to you. Take any new Medicines after you have completely understood and accpet all the possible adverse reactions/side effects.   Please note  You were cared for by a hospitalist during your hospital stay. If you have any questions about your discharge medications or the care you received while you were in the hospital after you are discharged, you can call the unit and asked to speak with the hospitalist on call if the hospitalist that took care of you is not available. Once you are discharged, your primary care physician will handle any further medical issues. Please note that NO REFILLS for any discharge medications will be authorized once you are discharged, as it is imperative that you return to your primary care physician (or establish a relationship with a primary  care physician if you do not have one) for your aftercare needs so that they can reassess your need for medications and monitor your lab values.     Today  No chief complaint on file.  Patient is doing fine no fever no overnight incidents  ROS: Unobtainable as the patient is chronically demented  VITAL SIGNS:  Blood pressure (!) 150/65, pulse 100, temperature 98.1 F (36.7 C), temperature source Oral, resp. rate 18, height 5\' 4"  (1.626 m), weight 62.8 kg, SpO2 100 %.  I/O:    Intake/Output Summary (Last 24 hours) at 10/24/2018 1225 Last data filed at 10/24/2018 0330 Gross per 24 hour  Intake 240 ml  Output 1225 ml  Net -985 ml    PHYSICAL EXAMINATION:  GENERAL:  85 y.o.-year-old patient lying in the bed with no acute distress.  EYES: Pupils equal, round, reactive to light  and accommodation. No scleral icterus. Extraocular muscles intact.  HEENT: Head atraumatic, normocephalic. Oropharynx and nasopharynx clear.  NECK:  Supple, no jugular venous distention. No thyroid enlargement, no tenderness.  LUNGS: Normal breath sounds bilaterally, no wheezing, rales,rhonchi or crepitation. No use of accessory muscles of respiration.  CARDIOVASCULAR: S1, S2 normal. No murmurs, rubs, or gallops.  ABDOMEN: Soft, non-tender, non-distended. Bowel sounds present.  EXTREMITIES: No pedal edema, cyanosis, or clubbing.  NEUROLOGIC: Awake, alert and disoriented PSYCHIATRIC: The patient is alert and disoriented.  SKIN: No obvious rash, lesion, or ulcer.   DATA REVIEW:   CBC Recent Labs  Lab 10/24/18 0432  WBC 7.3  HGB 8.4*  HCT 26.2*  PLT 293    Chemistries  Recent Labs  Lab 10/21/18 1437  10/24/18 0432  NA 136   < > 139  K 4.2   < > 3.8  CL 101   < > 108  CO2 24   < > 26  GLUCOSE 187*   < > 102*  BUN 41*   < > 32*  CREATININE 1.61*   < > 1.31*  CALCIUM 8.8*   < > 8.4*  AST 20  --   --   ALT 14  --   --   ALKPHOS 55  --   --   BILITOT 0.5  --   --    < > = values in this interval not displayed.    Cardiac Enzymes No results for input(s): TROPONINI in the last 168 hours.  Microbiology Results  Results for orders placed or performed during the hospital encounter of 10/21/18  Blood Culture (routine x 2)     Status: Abnormal   Collection Time: 10/21/18  2:30 PM   Specimen: BLOOD  Result Value Ref Range Status   Specimen Description   Final    BLOOD BLOOD LEFT HAND Performed at [facility], [address], [place], [place] [address]    Special Requests   Final    BOTTLES DRAWN AEROBIC AND ANAEROBIC Blood Culture adequate volume Performed at [facility], [address] [place], [place] [address]    Culture  Setup Time   Final    GRAM POSITIVE COCCI IN BOTH AEROBIC AND ANAEROBIC BOTTLES CRITICAL RESULT CALLED TO, READ BACK BY AND  VERIFIED WITH: Clarita Leber AT [id number] 10/22/2018.PMF    Culture (A)  Final    STAPHYLOCOCCUS SPECIES (COAGULASE NEGATIVE) THE SIGNIFICANCE OF ISOLATING THIS ORGANISM FROM A SINGLE SET OF BLOOD CULTURES WHEN MULTIPLE SETS ARE DRAWN IS UNCERTAIN. PLEASE NOTIFY THE MICROBIOLOGY DEPARTMENT WITHIN ONE WEEK IF SPECIATION AND SENSITIVITIES ARE  REQUIRED. Performed at [facility], [address] [place], [place] [address]    Report Status 10/24/2018 FINAL  Final  Blood Culture ID Panel (Reflexed)     Status: Abnormal   Collection Time: 10/21/18  2:30 PM  Result Value Ref Range Status   Enterococcus species NOT DETECTED NOT DETECTED Final   Listeria monocytogenes NOT DETECTED NOT DETECTED Final   Staphylococcus species DETECTED (A) NOT DETECTED Final    Comment: Methicillin (oxacillin) resistant coagulase negative staphylococcus. Possible blood culture contaminant (unless isolated from more than one blood culture draw or clinical case suggests pathogenicity). No antibiotic treatment is indicated for blood  culture contaminants. CRITICAL RESULT CALLED TO, READ BACK BY AND VERIFIED WITH: Wilkie Aye AT 5621 10/22/2018.PMF    Staphylococcus aureus (BCID) NOT DETECTED NOT DETECTED Final   Methicillin resistance DETECTED (A) NOT DETECTED Final    Comment: CRITICAL RESULT CALLED TO, READ BACK BY AND VERIFIED WITH: Wilkie Aye AT 3086 10/22/2018.PMF    Streptococcus species NOT DETECTED NOT DETECTED Final   Streptococcus agalactiae NOT DETECTED NOT DETECTED Final   Streptococcus pneumoniae NOT DETECTED NOT DETECTED Final   Streptococcus pyogenes NOT DETECTED NOT DETECTED Final   Acinetobacter baumannii NOT DETECTED NOT DETECTED Final   Enterobacteriaceae species NOT DETECTED NOT DETECTED Final   Enterobacter cloacae complex NOT DETECTED NOT DETECTED Final   Escherichia coli NOT DETECTED NOT DETECTED Final   Klebsiella oxytoca NOT DETECTED NOT DETECTED Final   Klebsiella pneumoniae NOT DETECTED NOT  DETECTED Final   Proteus species NOT DETECTED NOT DETECTED Final   Serratia marcescens NOT DETECTED NOT DETECTED Final   Haemophilus influenzae NOT DETECTED NOT DETECTED Final   Neisseria meningitidis NOT DETECTED NOT DETECTED Final   Pseudomonas aeruginosa NOT DETECTED NOT DETECTED Final   Candida albicans NOT DETECTED NOT DETECTED Final   Candida glabrata NOT DETECTED NOT DETECTED Final   Candida krusei NOT DETECTED NOT DETECTED Final   Candida parapsilosis NOT DETECTED NOT DETECTED Final   Candida tropicalis NOT DETECTED NOT DETECTED Final    Comment: Performed at [facility], [address] [place], [place] [address]  Blood Culture (routine x 2)     Status: None (Preliminary result)   Collection Time: 10/21/18  2:37 PM   Specimen: BLOOD  Result Value Ref Range Status   Specimen Description BLOOD BLOOD RIGHT HAND  Final   Special Requests   Final    BOTTLES DRAWN AEROBIC AND ANAEROBIC Blood Culture adequate volume   Culture   Final    NO GROWTH 3 DAYS Performed at [facility], [address] [place], [place] [address]    Report Status PENDING  Incomplete  Urine culture     Status: Abnormal   Collection Time: 10/21/18  2:37 PM   Specimen: In/Out Cath Urine  Result Value Ref Range Status   Specimen Description   Final    IN/OUT CATH URINE Performed at [facility], [address], [place], [place] [address]    Special Requests   Final    NONE Performed at [facility], [address] [place], [place] [address]    Culture >=100,000 COLONIES/mL PSEUDOMONAS AERUGINOSA (A)  Final   Report Status 10/23/2018 FINAL  Final   Organism ID, Bacteria PSEUDOMONAS AERUGINOSA (A)  Final      Susceptibility   Pseudomonas aeruginosa - MIC*    CEFTAZIDIME <=1 SENSITIVE Sensitive     CIPROFLOXACIN <=0.25 SENSITIVE Sensitive     GENTAMICIN <=  1 SENSITIVE Sensitive     IMIPENEM 1 SENSITIVE Sensitive     PIP/TAZO <=4 SENSITIVE Sensitive      CEFEPIME <=1 SENSITIVE Sensitive     * >=100,000 COLONIES/mL PSEUDOMONAS AERUGINOSA  SARS Coronavirus 2 [facility] order, Performed in [facility] hospital lab) Nasopharyngeal Nasopharyngeal Swab     Status: None   Collection Time: 10/21/18  2:37 PM   Specimen: Nasopharyngeal Swab  Result Value Ref Range Status   SARS Coronavirus 2 NEGATIVE NEGATIVE Final    Comment: (NOTE) If result is NEGATIVE SARS-CoV-2 target nucleic acids are NOT DETECTED. The SARS-CoV-2 RNA is generally detectable in upper and lower  respiratory specimens during the acute phase of infection. The lowest  concentration of SARS-CoV-2 viral copies this assay can detect is 250  copies / mL. A negative result does not preclude SARS-CoV-2 infection  and should not be used as the sole basis for treatment or other  patient management decisions.  A negative result may occur with  improper specimen collection / handling, submission of specimen other  than nasopharyngeal swab, presence of viral mutation(s) within the  areas targeted by this assay, and inadequate number of viral copies  (<250 copies / mL). A negative result must be combined with clinical  observations, patient history, and epidemiological information. If result is POSITIVE SARS-CoV-2 target nucleic acids are DETECTED. The SARS-CoV-2 RNA is generally detectable in upper and lower  respiratory specimens dur ing the acute phase of infection.  Positive  results are indicative of active infection with SARS-CoV-2.  Clinical  correlation with patient history and other diagnostic information is  necessary to determine patient infection status.  Positive results do  not rule out bacterial infection or co-infection with other viruses. If result is PRESUMPTIVE POSTIVE SARS-CoV-2 nucleic acids MAY BE PRESENT.   A presumptive positive result was obtained on the submitted specimen  and confirmed on repeat testing.  While 2019 novel coronavirus  (SARS-CoV-2) nucleic acids  may be present in the submitted sample  additional confirmatory testing may be necessary for epidemiological  and / or clinical management purposes  to differentiate between  SARS-CoV-2 and other Sarbecovirus currently known to infect humans.  If clinically indicated additional testing with an alternate test  methodology [phone]) is advised. The SARS-CoV-2 RNA is generally  detectable in upper and lower respiratory sp ecimens during the acute  phase of infection. The expected result is Negative. Fact Sheet for Patients:  BoilerBrush.com.cy Fact Sheet for Healthcare Providers: https://pope.com/ This test is not yet approved or cleared by the [place] FDA and has been authorized for detection and/or diagnosis of SARS-CoV-2 by FDA under an Emergency Use Authorization (EUA).  This EUA will remain in effect (meaning this test can be used) for the duration of the COVID-19 declaration under Section 564(b)(1) of the Act, 21 U.S.C. section 360bbb-3(b)(1), unless the authorization is terminated or revoked sooner. Performed at [facility], [address] [place], [place] [address]   MRSA PCR Screening     Status: None   Collection Time: 10/22/18 12:03 AM   Specimen: Nasopharyngeal  Result Value Ref Range Status   MRSA by PCR NEGATIVE NEGATIVE Final    Comment:        The GeneXpert MRSA Assay (FDA approved for NASAL specimens only), is one component of a comprehensive MRSA colonization surveillance program. It is not intended to diagnose MRSA infection nor to guide or monitor treatment for MRSA infections. Performed at [facility], [address] [place], [place] [address]     RADIOLOGY:  Dg Chest Port 1 View  Result Date: 10/21/2018 CLINICAL DATA:  Fevers EXAM: PORTABLE CHEST 1 VIEW COMPARISON:  06/26/2018 FINDINGS: Cardiac shadow is at the upper limits of normal in size. Aortic calcifications are again  noted and stable. Elevation of the right hemidiaphragm is noted. Multiple skin folds are noted over the left lung. No focal infiltrate or effusion is seen. IMPRESSION: No active disease. Electronically Signed   By: Alcide CleverMark  Lukens M.D.   On: 10/21/2018 15:19    EKG:   Orders placed or performed during the hospital encounter of 10/21/18  . ED EKG 12-Lead  . ED EKG 12-Lead  . EKG 12-Lead  . EKG 12-Lead      Call placed and left voicemail to patient's daughter Kathline MagicLeah MacDonell to call back for an update  CODE STATUS:     Code Status Orders  (From admission, onward)         Start     Ordered   10/21/18 2112  Do not attempt resuscitation (DNR)  Continuous    Question Answer Comment  In the event of cardiac or respiratory ARREST Do not call a "code blue"   In the event of cardiac or respiratory ARREST Do not perform Intubation, CPR, defibrillation or ACLS   In the event of cardiac or respiratory ARREST Use medication by any route, position, wound care, and other measures to relive pain and suffering. May use oxygen, suction and manual treatment of airway obstruction as needed for comfort.      10/21/18 2111        Code Status History    Date Active Date Inactive Code Status Order ID Comments User Context   08/22/2018 0016 08/24/2018 1456 DNR [id number]  Pearletha AlfredSeals, Angela H, NP ED   06/26/2018 0626 06/28/2018 1904 DNR [id number]  Hannah BeatMansy, Jan A, MD Inpatient   06/26/2018 0412 06/26/2018 0626 Full Code [id number]  Seals, Milas KocherAngela H, NP ED   Advance Care Planning Activity    Advance Directive Documentation     Most Recent Value  Type of Advance Directive  Healthcare Power of Attorney, Living will, Out of facility DNR (pink MOST or yellow form)  Pre-existing out of facility DNR order (yellow form or pink MOST form)  Yellow form placed in chart (order not valid for inpatient use)  "MOST" Form in Place?  -      TOTAL TIME TAKING CARE OF THIS PATIENT: 43  minutes.   Note: This dictation was prepared with  Dragon dictation along with smaller phrase technology. Any transcriptional errors that result from this process are unintentional.   @[facility] @  on 10/24/2018 at 12:25 PM  Between 7am to 6pm - Pager - [phone]  After 6pm go to [email or web address] - password [email or web address]  [facility]  Office  [phone]  CC: Primary care physician; Devoria GlassingLambert, Tracey, NP

## 2018-10-24 NOTE — Discharge Instructions (Signed)
F/u with pcp in 3 days ° °

## 2018-10-24 NOTE — TOC Transition Note (Signed)
Transition of Care [facility]) - CM/SW Discharge Note   Patient Details  Name: Adel Neyer MRN: [id number] Date of Birth: 1927/10/17  [facility]) CM/SW Contact:  Latanya Maudlin, RN Phone Number: 10/24/2018, 1:31 PM   Clinical Narrative: Patient set to return to facility. Patient is long term resident at [facility]. Spoke with [facility] in admissions and they are able to accept patient back today. Orders in place for PT/OT. Services will be provided at facility. Faxed orders, dc summary and neg COVID test to [facility] for transport. Bedside RN to call report.       Final next level of care: Long Term Nursing Home Barriers to Discharge: No Barriers Identified   Patient Goals and CMS Choice Patient states their goals for this hospitalization and ongoing recovery are:: Patient is not fully oriented. CMS Medicare.gov Compare Post Acute Care list provided to:: Patient Choice offered to / list presented to : Patient  Discharge Placement                       Discharge Plan and Services     Post Acute Care Choice: NA                               Social Determinants of Health (SDOH) Interventions     Readmission Risk Interventions No flowsheet data found.

## 2018-10-26 LAB — CULTURE, BLOOD (ROUTINE X 2)
Culture: NO GROWTH
Special Requests: ADEQUATE

## 2018-12-20 ENCOUNTER — Emergency Department
Admission: EM | Admit: 2018-12-20 | Discharge: 2018-12-20 | Disposition: A | Discharge status: Home / Self Care | Payer: Medicare Other | Attending: Emergency Medicine | Admitting: Emergency Medicine

## 2018-12-20 ENCOUNTER — Other Ambulatory Visit: Payer: Self-pay

## 2018-12-20 ENCOUNTER — Emergency Department: Payer: Medicare Other

## 2018-12-20 DIAGNOSIS — S0001XA Abrasion of scalp, initial encounter: Secondary | ICD-10-CM | POA: Diagnosis not present

## 2018-12-20 DIAGNOSIS — Z79899 Other long term (current) drug therapy: Secondary | ICD-10-CM | POA: Insufficient documentation

## 2018-12-20 DIAGNOSIS — Y999 Unspecified external cause status: Secondary | ICD-10-CM | POA: Insufficient documentation

## 2018-12-20 DIAGNOSIS — W010XXA Fall on same level from slipping, tripping and stumbling without subsequent striking against object, initial encounter: Secondary | ICD-10-CM | POA: Insufficient documentation

## 2018-12-20 DIAGNOSIS — I10 Essential (primary) hypertension: Secondary | ICD-10-CM | POA: Diagnosis not present

## 2018-12-20 DIAGNOSIS — Y92129 Unspecified place in nursing home as the place of occurrence of the external cause: Secondary | ICD-10-CM | POA: Insufficient documentation

## 2018-12-20 DIAGNOSIS — Z7982 Long term (current) use of aspirin: Secondary | ICD-10-CM | POA: Insufficient documentation

## 2018-12-20 DIAGNOSIS — F0390 Unspecified dementia without behavioral disturbance: Secondary | ICD-10-CM | POA: Insufficient documentation

## 2018-12-20 DIAGNOSIS — S0990XA Unspecified injury of head, initial encounter: Secondary | ICD-10-CM | POA: Diagnosis present

## 2018-12-20 DIAGNOSIS — Y939 Activity, unspecified: Secondary | ICD-10-CM | POA: Diagnosis not present

## 2018-12-20 DIAGNOSIS — W19XXXA Unspecified fall, initial encounter: Secondary | ICD-10-CM

## 2018-12-20 NOTE — ED Notes (Signed)
Patient sleeping comfortably, safety maintaned. Awaiting transfer back to [facility].

## 2018-12-20 NOTE — ED Triage Notes (Signed)
Patient sent from [facility] , with c/o of s/p fall laceration to left side forehead. Patient takes ASA daily.

## 2018-12-20 NOTE — ED Notes (Signed)
Patient from NH to be evaluated due to s/p tripp and fall as per ems, patient also states tripped and fell. Presents with lac to left side forehead, no active bleeding. md to eval patient off unit to ct head.

## 2018-12-20 NOTE — ED Provider Notes (Signed)
[facility] Emergency Department Provider Note   ____________________________________________   First MD Initiated Contact with Patient 12/20/18 1603     (approximate)  I have reviewed the triage vital signs and the nursing notes.   HISTORY  Chief Complaint Fall   HPI Sabrina Burke is a 85 y.o. female with possible history of hypertension and dementia who presents to the ED following fall.  History is limited secondary to patient's dementia.  She arrives by EMS from [place], where she resides.  Per EMS, patient tripped and had a mechanical fall just prior to arrival, where she struck her forehead.  Staff noticed a small amount of bleeding from her forehead that has since resolved.  Patient does not take any anticoagulation beyond a baby aspirin.  She currently denies any complaints including headache, neck pain, chest pain, abdominal pain, or extremity pain.        Past Medical History:  Diagnosis Date  . Dementia ([place])   . Hypertension     Patient Active Problem List   Diagnosis Date Noted  . Sepsis ([place]) 06/26/2018    History reviewed. No pertinent surgical history.  Prior to Admission medications   Medication Sig Start Date End Date Taking? Authorizing Provider  acetaminophen (TYLENOL) 325 MG tablet Take 325 mg by mouth every 6 (six) hours as needed for mild pain or fever.     [provider]  alum & mag hydroxide-simeth (MAALOX/MYLANTA) 200-200-20 MG/5ML suspension Take 30 mLs by mouth as needed for indigestion or heartburn (max 4 doses in 24 hours).     [provider]  amLODipine (NORVASC) 10 MG tablet Take 1 tablet (10 mg total) by mouth daily. 06/28/18   Loletha Grayer, MD  aspirin EC 81 MG EC tablet Take 1 tablet (81 mg total) by mouth daily. 06/28/18   Loletha Grayer, MD  bisacodyl (DULCOLAX) 5 MG EC tablet Take 5 mg by mouth every other day as needed for moderate constipation.     [provider]   Cholecalciferol (VITAMIN D3) 50 MCG (2000 UT) TABS Take 2,000 Units by mouth daily.     [provider]  ciprofloxacin (CIPRO) 500 MG tablet Take 1 tablet (500 mg total) by mouth daily at 6 PM. 10/24/18   Gouru, Aruna, MD  docusate sodium (COLACE) 100 MG capsule Take 100 mg by mouth at bedtime.    [provider]  feeding supplement, ENSURE ENLIVE, (ENSURE ENLIVE) LIQD Take 237 mLs by mouth 2 (two) times daily between meals. 06/28/18   Loletha Grayer, MD  furosemide (LASIX) 20 MG tablet Take 20 mg by mouth daily.    [provider]  guaiFENesin (ROBITUSSIN) 100 MG/5ML liquid Take 200 mg by mouth every 6 (six) hours as needed for cough.     [provider]  levothyroxine (SYNTHROID) 88 MCG tablet Take 88 mcg by mouth daily before breakfast.     [provider]  loperamide (IMODIUM) 2 MG capsule Take 2 mg by mouth as needed for diarrhea or loose stools (max 8 doses in 24 hours).     [provider]  LORazepam (ATIVAN) 0.5 MG tablet Take 0.25 mg by mouth 2 (two) times daily as needed for anxiety.    [provider]  LORazepam (ATIVAN) 0.5 MG tablet Take 0.5 tablets (0.25 mg total) by mouth 2 (two) times daily. Takes at 1400 and 2100 10/24/18   Gouru, Illene Silver, MD  magnesium hydroxide (MILK OF MAGNESIA) 400 MG/5ML suspension Take 30  mLs by mouth daily as needed for mild constipation.    [provider]  Multiple Vitamin (DAILY VITE PO) Take 1 tablet by mouth daily.    [provider]  neomycin-bacitracin-polymyxin (NEOSPORIN) ointment Apply 1 application topically as needed for wound care.    [provider]  polyethylene glycol (MIRALAX / GLYCOLAX) 17 g packet Take 17 g by mouth daily as needed for mild constipation. 10/24/18   Gouru, Deanna Artis, MD  potassium chloride (KLOR-CON) 20 MEQ packet Take 20 mEq by mouth daily. 08/24/18   Alford Highland, MD    Allergies Patient has no known allergies.  History reviewed. No  pertinent family history.  Social History Social History   Tobacco Use  . Smoking status: Never Smoker  . Smokeless tobacco: Never Used  . Tobacco comment: pt with advance dementia  Substance Use Topics  . Alcohol use: Not Currently  . Drug use: Not Currently    Review of Systems  Constitutional: No fever/chills Eyes: No visual changes. ENT: No sore throat. Cardiovascular: Denies chest pain. Respiratory: Denies shortness of breath. Gastrointestinal: No abdominal pain.  No nausea, no vomiting.  No diarrhea.  No constipation. Genitourinary: Negative for dysuria. Musculoskeletal: Negative for back pain. Skin: Negative for rash.  Positive for abrasion. Neurological: Negative for headaches, focal weakness or numbness.  ____________________________________________   PHYSICAL EXAM:  VITAL SIGNS: ED Triage Vitals  Enc Vitals Group     BP      Pulse      Resp      Temp      Temp src      SpO2      Weight      Height      Head Circumference      Peak Flow      Pain Score      Pain Loc      Pain Edu?      Excl. in GC?     Constitutional: Awake and alert, comfortable appearing. Eyes: Conjunctivae are normal. Head: Small abrasion to left frontal scalp with no active bleeding. Nose: No congestion/rhinnorhea. Mouth/Throat: Mucous membranes are moist. Neck: Normal ROM, no midline cervical spine tenderness. Cardiovascular: Normal rate, regular rhythm. Grossly normal heart sounds. Respiratory: Normal respiratory effort.  No retractions. Lungs CTAB. Gastrointestinal: Soft and nontender. No distention. Genitourinary: deferred Musculoskeletal: No lower extremity tenderness nor edema. Neurologic:  Normal speech and language. No gross focal neurologic deficits are appreciated. Skin:  Skin is warm, dry and intact. No rash noted. Psychiatric: Mood and affect are normal. Speech and behavior are normal.  ____________________________________________   LABS (all labs ordered  are listed, but only abnormal results are displayed)  Labs Reviewed - No data to display   PROCEDURES  Procedure(s) performed (including Critical Care):  Procedures   ____________________________________________   INITIAL IMPRESSION / ASSESSMENT AND PLAN / ED COURSE       85 year old female presents to the ED after reported mechanical fall at her nursing facility.  She has a small abrasion to her frontal scalp but denies any complaints at this time and exam is reassuring.  She has no focal neurologic deficits and is at her baseline mental status.  Will image head and neck however further work-up not indicated at this time as there is no reason to suspect syncopal episode.  Patient would be appropriate for discharge back to nursing facility if imaging is unremarkable.  Imaging was significant only for mild edema, no significant injuries noted.  Patient remains  at her baseline mental status and is appropriate for discharge back to [facility].  Patient's guardian was contacted and agrees with plan.      ____________________________________________   FINAL CLINICAL IMPRESSION(S) / ED DIAGNOSES  Final diagnoses:  Fall, initial encounter  Abrasion of scalp, initial encounter     ED Discharge Orders    None       Note:  This document was prepared using Dragon voice recognition software and may include unintentional dictation errors.   Chesley NoonJessup, Gao Mitnick, MD 12/20/18 [phone]

## 2018-12-20 NOTE — ED Notes (Signed)
Daughter called and updated with patient status, aware patient will be returning back to [facility],

## 2018-12-20 NOTE — ED Notes (Signed)
Dinner tray provided patient ate 100% of her dinner, drink some fluids and went to sleep. Awaiting transport back to [facility].

## 2018-12-20 NOTE — ED Notes (Signed)
Laceration to left side forehead clean and bacitracin applied.

## 2018-12-20 NOTE — ED Notes (Signed)
CT of head negative patient to be discharged back to [place].

## 2018-12-31 ENCOUNTER — Other Ambulatory Visit: Payer: Self-pay

## 2018-12-31 ENCOUNTER — Non-Acute Institutional Stay: Payer: Medicare Other | Admitting: Adult Health Nurse Practitioner

## 2018-12-31 DIAGNOSIS — Z515 Encounter for palliative care: Secondary | ICD-10-CM

## 2018-12-31 DIAGNOSIS — F0391 Unspecified dementia with behavioral disturbance: Secondary | ICD-10-CM

## 2019-01-01 NOTE — Progress Notes (Signed)
Therapist, nutritional Palliative Care Consult Note Telephone: [phone]  Fax: [phone]  PATIENT NAME: Sabrina Burke DOB: 09-17-1927 MRN: [id number]  PRIMARY CARE PROVIDER:   Devoria Glassing, NP  REFERRING PROVIDER:  Devoria Glassing, NP [address] [place],  [place] [address]  RESPONSIBLE PARTY:   Jahzaria Vary, daughter  [phone]    RECOMMENDATIONS and PLAN:  1.  Advanced care planning.  Patient is a DNR.  Will call daughter and update on visit.  2.  Dementia.  FAST 7b.  Patient is nonambulatory.  Uses wheelchair to get around.  Requires assistance with ADLs.  Has had an 11 pound weight loss over the past month.  11/26/2018  Weighed 131.5 pounds and on 12/22/2018 weighed 120.5 pounds.  Patient is on nutritional supplements TID and has started being a feeder.  Staff reports that she does not like being fed by someone else.  Continue to encourage supplementation and finger foods and monitoring weight. Patient has had 4 hospital visits over the past 6 months for falls and sepsis related to UTI.  Last one was on 11/3 for a fall.  This is first visit with patient but she seems to be having a decline related to her dementia and is having more frequent falls and infection.  She is currently working with PT/OT.  Continue supportive care  Palliative care will continue to monitor for symptom management and decline and make recommendations as needed.  Have reached out to hospice doctors to see about hospice eligibility and she is hospice appropriate.  Will have discussion with daughter about this and goals of care.  I spent 30 minutes providing this consultation,  from 10:45 to 11:15. More than 50% of the time in this consultation was spent coordinating communication.   HISTORY OF PRESENT ILLNESS:  Sabrina Burke is a 85 y.o. year old female with multiple medical problems including vascular dementia, hypothyroidism, HTN, PVD, protein calorie malnutrition.  Palliative Care was asked to help address goals of care. Patient is a resident at [facility].  Denies SOB, cough, fever, N/V/D, constipation.  CODE STATUS: DNR  PPS: 50% HOSPICE ELIGIBILITY/DIAGNOSIS:  TBD  PHYSICAL EXAM:   General: NAD, frail appearing, thin Cardiovascular: regular rate and rhythm Pulmonary: lung sounds clear; normal respiratory effort Abdomen: soft, nontender, + bowel sounds GU: no suprapubic tenderness Extremities: trace edema to feet, no joint deformities Skin: no rashes Neurological: Weakness; A&O to person and place   PAST MEDICAL HISTORY:  Past Medical History:  Diagnosis Date  . Dementia ([facility])   . Hypertension     SOCIAL HX:  Social History   Tobacco Use  . Smoking status: Never Smoker  . Smokeless tobacco: Never Used  . Tobacco comment: pt with advance dementia  Substance Use Topics  . Alcohol use: Not Currently    ALLERGIES: No Known Allergies   PERTINENT MEDICATIONS:  Outpatient Encounter Medications as of 12/31/2018  Medication Sig  . acetaminophen (TYLENOL) 325 MG tablet Take 325 mg by mouth every 6 (six) hours as needed for mild pain or fever.   Marland Kitchen alum & mag hydroxide-simeth (MAALOX/MYLANTA) 200-200-20 MG/5ML suspension Take 30 mLs by mouth as needed for indigestion or heartburn (max 4 doses in 24 hours).   Marland Kitchen amLODipine (NORVASC) 10 MG tablet Take 1 tablet (10 mg total) by mouth daily.  Marland Kitchen aspirin EC 81 MG EC tablet Take 1 tablet (81 mg total) by mouth daily.  . bisacodyl (DULCOLAX) 5 MG EC tablet Take 5  mg by mouth every other day as needed for moderate constipation.   . Cholecalciferol (VITAMIN D3) 50 MCG (2000 UT) TABS Take 2,000 Units by mouth daily.   . ciprofloxacin (CIPRO) 500 MG tablet Take 1 tablet (500 mg total) by mouth daily at 6 PM.  . docusate sodium (COLACE) 100 MG capsule Take 100 mg by mouth at bedtime.  . feeding supplement, ENSURE ENLIVE, (ENSURE ENLIVE) LIQD Take 237 mLs by mouth 2 (two) times daily between meals.    . furosemide (LASIX) 20 MG tablet Take 20 mg by mouth daily.  Marland Kitchen guaiFENesin (ROBITUSSIN) 100 MG/5ML liquid Take 200 mg by mouth every 6 (six) hours as needed for cough.   . levothyroxine (SYNTHROID) 88 MCG tablet Take 88 mcg by mouth daily before breakfast.   . loperamide (IMODIUM) 2 MG capsule Take 2 mg by mouth as needed for diarrhea or loose stools (max 8 doses in 24 hours).   . LORazepam (ATIVAN) 0.5 MG tablet Take 0.25 mg by mouth 2 (two) times daily as needed for anxiety.  Marland Kitchen LORazepam (ATIVAN) 0.5 MG tablet Take 0.5 tablets (0.25 mg total) by mouth 2 (two) times daily. Takes at 1400 and 2100  . magnesium hydroxide (MILK OF MAGNESIA) 400 MG/5ML suspension Take 30 mLs by mouth daily as needed for mild constipation.  . Multiple Vitamin (DAILY VITE PO) Take 1 tablet by mouth daily.  Marland Kitchen neomycin-bacitracin-polymyxin (NEOSPORIN) ointment Apply 1 application topically as needed for wound care.  . polyethylene glycol (MIRALAX / GLYCOLAX) 17 g packet Take 17 g by mouth daily as needed for mild constipation.  . potassium chloride (KLOR-CON) 20 MEQ packet Take 20 mEq by mouth daily.   No facility-administered encounter medications on file as of 12/31/2018.     Erubiel Manasco Jenetta Downer, NP

## 2019-01-03 ENCOUNTER — Telehealth: Payer: Self-pay | Admitting: Adult Health Nurse Practitioner

## 2019-01-03 NOTE — Telephone Encounter (Signed)
Spoke with daughter to give updates on last week's visit.  Discussed hospice eligibility.  Agreed to wait for hospice and see if interventions of PT/OT and feeding cues are going to help.  Will visit again after Christmas and make decisions based on effectiveness of interventions.  Encouraged to call with concerns. Dajaun Goldring K. Olena Heckle NP

## 2019-01-03 NOTE — Telephone Encounter (Signed)
Left VM with daughter to update on visit with her mother last week.  Left call back info for return call Tayte Childers K. Olena Heckle NP

## 2019-01-28 ENCOUNTER — Non-Acute Institutional Stay: Payer: Medicare Other | Admitting: Adult Health Nurse Practitioner

## 2019-01-28 ENCOUNTER — Other Ambulatory Visit: Payer: Self-pay

## 2019-01-28 DIAGNOSIS — F0391 Unspecified dementia with behavioral disturbance: Secondary | ICD-10-CM

## 2019-01-28 DIAGNOSIS — Z515 Encounter for palliative care: Secondary | ICD-10-CM

## 2019-01-28 NOTE — Progress Notes (Signed)
[facility] Consult Note Telephone: [phone]  Fax: [phone]  PATIENT NAME: Sabrina Burke DOB: 08-07-1927 MRN: [id number]  PRIMARY CARE PROVIDER:   Odessa Fleming, NP  REFERRING PROVIDER:  Odessa Fleming, NP [address] [place],  [place] [address]  RESPONSIBLE PARTY:   Harrison Paulson, daughter  [phone]  Due to the COVID-19 crisis, this visit was done via telemedicine and it was initiated and consent by this patient and or family. Video-audio (telehealth) contact was unable to be done due to technical barriers from the patient's side.    RECOMMENDATIONS and PLAN:  1.  Advanced care planning.  Patient is a DNR.  Called daughter and left VM with update and call back info to call with any questions and concerns  2. Dementia.  FAST 7b. Patient is nonambulatory.  Uses wheelchair to get around.  Requires assistance with ADLs. Incontinent of B&B.  Last month she had an 11 pound weight loss and weighed 120.5.  This month she weights 126.  She gets Art therapist for supplementation. She continues to work with PT.  Continue supportive care.   No reports of falls, infection, or hospitalization since last visit.  Continue current plan of care and palliative will continue to monitor for symptom management and/or decline and will make recommendations as needed  I spent 25 minutes providing this consultation,  from 11:00 to 11:25. More than 50% of the time in this consultation was spent coordinating communication.   HISTORY OF PRESENT ILLNESS:  Sabrina Burke is a 86 y.o. year old female with multiple medical problems including vascular dementia, hypothyroidism, HTN, PVD, protein calorie malnutrition. Palliative Care was asked to help address goals of care.   CODE STATUS: DNR  PPS: 50% HOSPICE ELIGIBILITY/DIAGNOSIS: TBD  PHYSICAL EXAM:   Deferred  PAST MEDICAL HISTORY:  Past Medical History:  Diagnosis Date  .  Dementia ([place])   . Hypertension     SOCIAL HX:  Social History   Tobacco Use  . Smoking status: Never Smoker  . Smokeless tobacco: Never Used  . Tobacco comment: pt with advance dementia  Substance Use Topics  . Alcohol use: Not Currently    ALLERGIES: No Known Allergies   PERTINENT MEDICATIONS:  Outpatient Encounter Medications as of 01/28/2019  Medication Sig  . acetaminophen (TYLENOL) 325 MG tablet Take 325 mg by mouth every 6 (six) hours as needed for mild pain or fever.   Marland Kitchen alum & mag hydroxide-simeth (MAALOX/MYLANTA) 200-200-20 MG/5ML suspension Take 30 mLs by mouth as needed for indigestion or heartburn (max 4 doses in 24 hours).   Marland Kitchen amLODipine (NORVASC) 10 MG tablet Take 1 tablet (10 mg total) by mouth daily.  Marland Kitchen aspirin EC 81 MG EC tablet Take 1 tablet (81 mg total) by mouth daily.  . bisacodyl (DULCOLAX) 5 MG EC tablet Take 5 mg by mouth every other day as needed for moderate constipation.   . Cholecalciferol (VITAMIN D3) 50 MCG (2000 UT) TABS Take 2,000 Units by mouth daily.   . ciprofloxacin (CIPRO) 500 MG tablet Take 1 tablet (500 mg total) by mouth daily at 6 PM.  . docusate sodium (COLACE) 100 MG capsule Take 100 mg by mouth at bedtime.  . feeding supplement, ENSURE ENLIVE, (ENSURE ENLIVE) LIQD Take 237 mLs by mouth 2 (two) times daily between meals.  . furosemide (LASIX) 20 MG tablet Take 20 mg by mouth daily.  Marland Kitchen guaiFENesin (ROBITUSSIN) 100 MG/5ML liquid Take 200 mg by mouth  every 6 (six) hours as needed for cough.   . levothyroxine (SYNTHROID) 88 MCG tablet Take 88 mcg by mouth daily before breakfast.   . loperamide (IMODIUM) 2 MG capsule Take 2 mg by mouth as needed for diarrhea or loose stools (max 8 doses in 24 hours).   . LORazepam (ATIVAN) 0.5 MG tablet Take 0.25 mg by mouth 2 (two) times daily as needed for anxiety.  Marland Kitchen LORazepam (ATIVAN) 0.5 MG tablet Take 0.5 tablets (0.25 mg total) by mouth 2 (two) times daily. Takes at 1400 and 2100  . magnesium hydroxide  (MILK OF MAGNESIA) 400 MG/5ML suspension Take 30 mLs by mouth daily as needed for mild constipation.  . Multiple Vitamin (DAILY VITE PO) Take 1 tablet by mouth daily.  Marland Kitchen neomycin-bacitracin-polymyxin (NEOSPORIN) ointment Apply 1 application topically as needed for wound care.  . polyethylene glycol (MIRALAX / GLYCOLAX) 17 g packet Take 17 g by mouth daily as needed for mild constipation.  . potassium chloride (KLOR-CON) 20 MEQ packet Take 20 mEq by mouth daily.   No facility-administered encounter medications on file as of 01/28/2019.      Mckoy Bhakta Marlena Clipper, NP

## 2019-02-20 ENCOUNTER — Emergency Department: Payer: Medicare Other

## 2019-02-20 ENCOUNTER — Inpatient Hospital Stay
Admission: EM | Admit: 2019-02-20 | Discharge: 2019-03-03 | DRG: 853 | Disposition: A | Discharge status: Skilled Nursing Facility | Payer: Medicare Other | Attending: Internal Medicine | Admitting: Internal Medicine

## 2019-02-20 ENCOUNTER — Other Ambulatory Visit: Payer: Self-pay

## 2019-02-20 DIAGNOSIS — R4702 Dysphasia: Secondary | ICD-10-CM | POA: Diagnosis not present

## 2019-02-20 DIAGNOSIS — I129 Hypertensive chronic kidney disease with stage 1 through stage 4 chronic kidney disease, or unspecified chronic kidney disease: Secondary | ICD-10-CM | POA: Diagnosis present

## 2019-02-20 DIAGNOSIS — R1310 Dysphagia, unspecified: Secondary | ICD-10-CM | POA: Diagnosis not present

## 2019-02-20 DIAGNOSIS — J690 Pneumonitis due to inhalation of food and vomit: Secondary | ICD-10-CM | POA: Diagnosis present

## 2019-02-20 DIAGNOSIS — Z20822 Contact with and (suspected) exposure to covid-19: Secondary | ICD-10-CM | POA: Diagnosis present

## 2019-02-20 DIAGNOSIS — A4150 Gram-negative sepsis, unspecified: Secondary | ICD-10-CM | POA: Diagnosis not present

## 2019-02-20 DIAGNOSIS — I160 Hypertensive urgency: Secondary | ICD-10-CM | POA: Diagnosis not present

## 2019-02-20 DIAGNOSIS — E870 Hyperosmolality and hypernatremia: Secondary | ICD-10-CM | POA: Diagnosis present

## 2019-02-20 DIAGNOSIS — R509 Fever, unspecified: Secondary | ICD-10-CM

## 2019-02-20 DIAGNOSIS — N189 Chronic kidney disease, unspecified: Secondary | ICD-10-CM | POA: Diagnosis not present

## 2019-02-20 DIAGNOSIS — E039 Hypothyroidism, unspecified: Secondary | ICD-10-CM | POA: Diagnosis present

## 2019-02-20 DIAGNOSIS — N1832 Chronic kidney disease, stage 3b: Secondary | ICD-10-CM | POA: Diagnosis present

## 2019-02-20 DIAGNOSIS — Z515 Encounter for palliative care: Secondary | ICD-10-CM | POA: Diagnosis present

## 2019-02-20 DIAGNOSIS — S72012A Unspecified intracapsular fracture of left femur, initial encounter for closed fracture: Secondary | ICD-10-CM | POA: Diagnosis present

## 2019-02-20 DIAGNOSIS — F0390 Unspecified dementia without behavioral disturbance: Secondary | ICD-10-CM | POA: Diagnosis present

## 2019-02-20 DIAGNOSIS — Z66 Do not resuscitate: Secondary | ICD-10-CM | POA: Diagnosis not present

## 2019-02-20 DIAGNOSIS — N179 Acute kidney failure, unspecified: Secondary | ICD-10-CM | POA: Diagnosis not present

## 2019-02-20 DIAGNOSIS — Z681 Body mass index (BMI) 19 or less, adult: Secondary | ICD-10-CM

## 2019-02-20 DIAGNOSIS — K5900 Constipation, unspecified: Secondary | ICD-10-CM | POA: Diagnosis not present

## 2019-02-20 DIAGNOSIS — B9620 Unspecified Escherichia coli [E. coli] as the cause of diseases classified elsewhere: Secondary | ICD-10-CM | POA: Diagnosis present

## 2019-02-20 DIAGNOSIS — Z7189 Other specified counseling: Secondary | ICD-10-CM | POA: Diagnosis not present

## 2019-02-20 DIAGNOSIS — N390 Urinary tract infection, site not specified: Secondary | ICD-10-CM | POA: Diagnosis present

## 2019-02-20 DIAGNOSIS — E43 Unspecified severe protein-calorie malnutrition: Secondary | ICD-10-CM | POA: Insufficient documentation

## 2019-02-20 DIAGNOSIS — S72009A Fracture of unspecified part of neck of unspecified femur, initial encounter for closed fracture: Secondary | ICD-10-CM

## 2019-02-20 DIAGNOSIS — E876 Hypokalemia: Secondary | ICD-10-CM | POA: Diagnosis not present

## 2019-02-20 DIAGNOSIS — Z993 Dependence on wheelchair: Secondary | ICD-10-CM | POA: Diagnosis not present

## 2019-02-20 DIAGNOSIS — E871 Hypo-osmolality and hyponatremia: Secondary | ICD-10-CM | POA: Diagnosis not present

## 2019-02-20 DIAGNOSIS — A419 Sepsis, unspecified organism: Secondary | ICD-10-CM | POA: Diagnosis present

## 2019-02-20 DIAGNOSIS — W1830XA Fall on same level, unspecified, initial encounter: Secondary | ICD-10-CM | POA: Diagnosis present

## 2019-02-20 DIAGNOSIS — S72002A Fracture of unspecified part of neck of left femur, initial encounter for closed fracture: Secondary | ICD-10-CM | POA: Diagnosis not present

## 2019-02-20 DIAGNOSIS — I10 Essential (primary) hypertension: Secondary | ICD-10-CM

## 2019-02-20 DIAGNOSIS — S72002S Fracture of unspecified part of neck of left femur, sequela: Secondary | ICD-10-CM | POA: Diagnosis not present

## 2019-02-20 DIAGNOSIS — Z79899 Other long term (current) drug therapy: Secondary | ICD-10-CM

## 2019-02-20 LAB — CBC WITH DIFFERENTIAL/PLATELET
Abs Immature Granulocytes: 0.05 10*3/uL (ref 0.00–0.07)
Basophils Absolute: 0.1 10*3/uL (ref 0.0–0.1)
Basophils Relative: 0 %
Eosinophils Absolute: 0.1 10*3/uL (ref 0.0–0.5)
Eosinophils Relative: 0 %
HCT: 32 % — ABNORMAL LOW (ref 36.0–46.0)
Hemoglobin: 10.3 g/dL — ABNORMAL LOW (ref 12.0–15.0)
Immature Granulocytes: 0 %
Lymphocytes Relative: 17 %
Lymphs Abs: 2.2 10*3/uL (ref 0.7–4.0)
MCH: 32.6 pg (ref 26.0–34.0)
MCHC: 32.2 g/dL (ref 30.0–36.0)
MCV: 101.3 fL — ABNORMAL HIGH (ref 80.0–100.0)
Monocytes Absolute: 1.5 10*3/uL — ABNORMAL HIGH (ref 0.1–1.0)
Monocytes Relative: 11 %
Neutro Abs: 9.4 10*3/uL — ABNORMAL HIGH (ref 1.7–7.7)
Neutrophils Relative %: 72 %
Platelets: 266 10*3/uL (ref 150–400)
RBC: 3.16 MIL/uL — ABNORMAL LOW (ref 3.87–5.11)
RDW: 12.5 % (ref 11.5–15.5)
WBC: 13.2 10*3/uL — ABNORMAL HIGH (ref 4.0–10.5)
nRBC: 0 % (ref 0.0–0.2)

## 2019-02-20 LAB — COMPREHENSIVE METABOLIC PANEL
ALT: 22 U/L (ref 0–44)
AST: 27 U/L (ref 15–41)
Albumin: 2.9 g/dL — ABNORMAL LOW (ref 3.5–5.0)
Alkaline Phosphatase: 77 U/L (ref 38–126)
Anion gap: 10 (ref 5–15)
BUN: 50 mg/dL — ABNORMAL HIGH (ref 8–23)
CO2: 26 mmol/L (ref 22–32)
Calcium: 8.9 mg/dL (ref 8.9–10.3)
Chloride: 109 mmol/L (ref 98–111)
Creatinine, Ser: 2.01 mg/dL — ABNORMAL HIGH (ref 0.44–1.00)
GFR calc Af Amer: 25 mL/min — ABNORMAL LOW (ref >60)
GFR calc non Af Amer: 21 mL/min — ABNORMAL LOW (ref >60)
Glucose, Bld: 154 mg/dL — ABNORMAL HIGH (ref 70–99)
Potassium: 4 mmol/L (ref 3.5–5.1)
Sodium: 145 mmol/L (ref 135–145)
Total Bilirubin: 0.6 mg/dL (ref 0.3–1.2)
Total Protein: 7.2 g/dL (ref 6.5–8.1)

## 2019-02-20 LAB — LACTIC ACID, PLASMA: Lactic Acid, Venous: 1.1 mmol/L (ref 0.5–1.9)

## 2019-02-20 LAB — PROTIME-INR
INR: 1.1 (ref 0.8–1.2)
Prothrombin Time: 14.5 seconds (ref 11.4–15.2)

## 2019-02-20 LAB — RESPIRATORY PANEL BY RT PCR (FLU A&B, COVID)
Influenza A by PCR: NEGATIVE
Influenza B by PCR: NEGATIVE
SARS Coronavirus 2 by RT PCR: NEGATIVE

## 2019-02-20 LAB — GLUCOSE, CAPILLARY: Glucose-Capillary: 151 mg/dL — ABNORMAL HIGH (ref 70–99)

## 2019-02-20 MED ORDER — BISACODYL 5 MG PO TBEC: 5.0000 mg | DELAYED_RELEASE_TABLET | ORAL | Status: DC | PRN

## 2019-02-20 MED ORDER — ACETAMINOPHEN 325 MG PO TABS: 650.0000 mg | ORAL_TABLET | Freq: Four times a day (QID) | ORAL | Status: DC | PRN

## 2019-02-20 MED ORDER — MAGNESIUM HYDROXIDE 400 MG/5ML PO SUSP: 30.0000 mL | Freq: Every day | ORAL | Status: DC | PRN

## 2019-02-20 MED ORDER — ONDANSETRON HCL 4 MG PO TABS: 4.0000 mg | ORAL_TABLET | Freq: Four times a day (QID) | ORAL | Status: DC | PRN

## 2019-02-20 MED ORDER — ONDANSETRON HCL 4 MG/2ML IJ SOLN: 4.0000 mg | Freq: Four times a day (QID) | INTRAMUSCULAR | Status: DC | PRN

## 2019-02-20 MED ORDER — LORAZEPAM 0.5 MG PO TABS
0.2500 mg | ORAL_TABLET | Freq: Two times a day (BID) | ORAL | Status: DC | PRN
  Administered 2019-03-01: 16:00:00 0.25 mg via ORAL

## 2019-02-20 MED ORDER — LEVOTHYROXINE SODIUM 88 MCG PO TABS
88.0000 ug | ORAL_TABLET | Freq: Every day | ORAL | Status: DC
  Administered 2019-02-22: 07:00:00 88 ug via ORAL
  Filled 2019-02-20 (×2): qty 1

## 2019-02-20 MED ORDER — FUROSEMIDE 40 MG PO TABS: 20.0000 mg | ORAL_TABLET | Freq: Every day | ORAL | Status: DC

## 2019-02-20 MED ORDER — ALUM & MAG HYDROXIDE-SIMETH 200-200-20 MG/5ML PO SUSP: 30.0000 mL | ORAL | Status: DC | PRN

## 2019-02-20 MED ORDER — ACETAMINOPHEN 650 MG RE SUPP
650.0000 mg | Freq: Once | RECTAL | Status: AC
  Administered 2019-02-20: 21:00:00 650 mg via RECTAL
  Filled 2019-02-20: qty 1

## 2019-02-20 MED ORDER — DOCUSATE SODIUM 100 MG PO CAPS: 100.0000 mg | ORAL_CAPSULE | Freq: Every day | ORAL | Status: DC

## 2019-02-20 MED ORDER — MORPHINE SULFATE (PF) 4 MG/ML IV SOLN
4.0000 mg | Freq: Once | INTRAVENOUS | Status: AC
  Administered 2019-02-20: 21:00:00 4 mg via INTRAVENOUS
  Filled 2019-02-20: qty 1

## 2019-02-20 MED ORDER — ACETAMINOPHEN 650 MG RE SUPP: 650.0000 mg | Freq: Four times a day (QID) | RECTAL | Status: DC | PRN

## 2019-02-20 MED ORDER — POTASSIUM CHLORIDE 20 MEQ PO PACK: 20.0000 meq | PACK | Freq: Every day | ORAL | Status: DC

## 2019-02-20 MED ORDER — CEFAZOLIN SODIUM-DEXTROSE 2-4 GM/100ML-% IV SOLN
2.0000 g | INTRAVENOUS | Status: DC
  Filled 2019-02-20: qty 100

## 2019-02-20 MED ORDER — POLYETHYLENE GLYCOL 3350 17 G PO PACK: 17.0000 g | PACK | Freq: Every day | ORAL | Status: DC | PRN

## 2019-02-20 MED ORDER — OXYCODONE HCL 5 MG PO TABS: 5.0000 mg | ORAL_TABLET | ORAL | Status: DC | PRN

## 2019-02-20 MED ORDER — SODIUM CHLORIDE 0.9 % IV SOLN: INTRAVENOUS | Status: DC

## 2019-02-20 MED ORDER — LORAZEPAM 0.5 MG PO TABS
0.2500 mg | ORAL_TABLET | Freq: Two times a day (BID) | ORAL | Status: DC
  Administered 2019-02-21 – 2019-03-02 (×9): 0.25 mg via ORAL
  Filled 2019-02-20 (×10): qty 1

## 2019-02-20 MED ORDER — VITAMIN D 25 MCG (1000 UNIT) PO TABS
2000.0000 [IU] | ORAL_TABLET | Freq: Every day | ORAL | Status: DC
  Administered 2019-02-22 – 2019-03-03 (×9): 2000 [IU] via ORAL
  Filled 2019-02-20 (×9): qty 2

## 2019-02-20 MED ORDER — ADULT MULTIVITAMIN W/MINERALS CH
ORAL_TABLET | Freq: Every day | ORAL | Status: DC
  Administered 2019-02-22 – 2019-02-28 (×6): 1 via ORAL
  Administered 2019-03-01: 2 via ORAL
  Administered 2019-03-02: 09:00:00 1 via ORAL
  Filled 2019-02-20 (×8): qty 1

## 2019-02-20 MED ORDER — LOPERAMIDE HCL 2 MG PO CAPS
2.0000 mg | ORAL_CAPSULE | ORAL | Status: DC | PRN
  Filled 2019-02-20: qty 1

## 2019-02-20 MED ORDER — ENSURE ENLIVE PO LIQD
237.0000 mL | Freq: Two times a day (BID) | ORAL | Status: DC
  Administered 2019-02-22: 08:00:00 237 mL via ORAL

## 2019-02-20 MED ORDER — TRAZODONE HCL 50 MG PO TABS: 25.0000 mg | ORAL_TABLET | Freq: Every evening | ORAL | Status: DC | PRN

## 2019-02-20 MED ORDER — AMLODIPINE BESYLATE 10 MG PO TABS
10.0000 mg | ORAL_TABLET | Freq: Every day | ORAL | Status: DC
  Administered 2019-02-22: 08:00:00 10 mg via ORAL
  Filled 2019-02-20: qty 1

## 2019-02-20 NOTE — ED Notes (Signed)
Attempted to place foley without success due to pt position (hip pain restricts movement). Will medicate and attempt again.

## 2019-02-20 NOTE — ED Triage Notes (Signed)
Pt arrives from [facility] via [facility] for left hip pain from a fall on Friday.  Pt did not report pain at that time, but told staff at [facility] she was in pain today.  Vitals unremarkable per EMS.  Altered AMS at baseline.  Non-ambulatory.

## 2019-02-20 NOTE — ED Notes (Signed)
Pt c/o bilateral hip pain when moved.

## 2019-02-20 NOTE — ED Notes (Signed)
Pt is with CT

## 2019-02-20 NOTE — ED Provider Notes (Signed)
[facility] EMERGENCY DEPARTMENT Provider Note   CSN: [id number] Arrival date & time: 02/20/19  1942     History Chief Complaint  Patient presents with  . Fall    Sabrina Burke is a 86 y.o. female hx of dementia, hypertension here presenting with fall with left hip pain.  Patient is from a nursing home and is DNR Patient appears to be bedbound at baseline. Apparently several days ago, patient had a fall. Patient was not complaining of pain at that time and was complaining of pain when she is moved.  Patient sent here for x-rays to rule out hip fracture.  Patient denies any blood thinners.  The history is provided by the patient.       Past Medical History:  Diagnosis Date  . Dementia (HCC)   . Hypertension     Patient Active Problem List   Diagnosis Date Noted  . Sepsis (HCC) 06/26/2018    History reviewed. No pertinent surgical history.   OB History   No obstetric history on file.     History reviewed. No pertinent family history.  Social History   Tobacco Use  . Smoking status: Never Smoker  . Smokeless tobacco: Never Used  . Tobacco comment: pt with advance dementia  Substance Use Topics  . Alcohol use: Not Currently  . Drug use: Not Currently    Home Medications Prior to Admission medications   Medication Sig Start Date End Date Taking? Authorizing Provider  acetaminophen (TYLENOL) 325 MG tablet Take 325 mg by mouth every 6 (six) hours as needed for mild pain or fever.     [provider]  alum & mag hydroxide-simeth (MAALOX/MYLANTA) 200-200-20 MG/5ML suspension Take 30 mLs by mouth as needed for indigestion or heartburn (max 4 doses in 24 hours).     [provider]  amLODipine (NORVASC) 10 MG tablet Take 1 tablet (10 mg total) by mouth daily. 06/28/18   Alford Highland, MD  aspirin EC 81 MG EC tablet Take 1 tablet (81 mg total) by mouth daily. 06/28/18   Alford Highland, MD  bisacodyl (DULCOLAX) 5 MG EC tablet  Take 5 mg by mouth every other day as needed for moderate constipation.     [provider]  Cholecalciferol (VITAMIN D3) 50 MCG (2000 UT) TABS Take 2,000 Units by mouth daily.     [provider]  ciprofloxacin (CIPRO) 500 MG tablet Take 1 tablet (500 mg total) by mouth daily at 6 PM. 10/24/18   Gouru, Aruna, MD  docusate sodium (COLACE) 100 MG capsule Take 100 mg by mouth at bedtime.    [provider]  feeding supplement, ENSURE ENLIVE, (ENSURE ENLIVE) LIQD Take 237 mLs by mouth 2 (two) times daily between meals. 06/28/18   Alford Highland, MD  furosemide (LASIX) 20 MG tablet Take 20 mg by mouth daily.    [provider]  guaiFENesin (ROBITUSSIN) 100 MG/5ML liquid Take 200 mg by mouth every 6 (six) hours as needed for cough.     [provider]  levothyroxine (SYNTHROID) 88 MCG tablet Take 88 mcg by mouth daily before breakfast.     [provider]  loperamide (IMODIUM) 2 MG capsule Take 2 mg by mouth as needed for diarrhea or loose stools (max 8 doses in 24 hours).     [provider]  LORazepam (ATIVAN) 0.5 MG tablet Take 0.25 mg by mouth 2 (two) times daily as needed for anxiety.    [provider]  LORazepam (ATIVAN) 0.5 MG tablet Take 0.5 tablets (0.25 mg total) by mouth 2 (two) times daily. Takes at 1400 and 2100 10/24/18   Gouru, Deanna Artis, MD  magnesium hydroxide (MILK OF MAGNESIA) 400 MG/5ML suspension Take 30 mLs by mouth daily as needed for mild constipation.    [provider]  Multiple Vitamin (DAILY VITE PO) Take 1 tablet by mouth daily.    [provider]  neomycin-bacitracin-polymyxin (NEOSPORIN) ointment Apply 1 application topically as needed for wound care.    [provider]  polyethylene glycol (MIRALAX / GLYCOLAX) 17 g packet Take 17 g by mouth daily as needed for mild constipation. 10/24/18   Gouru, Deanna Artis, MD  potassium chloride (KLOR-CON) 20 MEQ packet Take 20 mEq by mouth daily.  08/24/18   Alford Highland, MD    Allergies    Patient has no known allergies.  Review of Systems   Review of Systems  Musculoskeletal:       L hip pain   All other systems reviewed and are negative.   Physical Exam Updated Vital Signs BP (!) 142/69   Pulse (!) 105   Temp (!) 102 F (38.9 C) (Rectal)   Resp (!) 32   Ht 5\' 2"  (1.575 m)   Wt 45.4 kg   SpO2 93%   BMI 18.29 kg/m   Physical Exam Vitals and nursing note reviewed.  Constitutional:      Comments: Altered, confused (demented)   HENT:     Head: Normocephalic.     Mouth/Throat:     Mouth: Mucous membranes are dry.  Eyes:     Extraocular Movements: Extraocular movements intact.     Pupils: Pupils are equal, round, and reactive to light.  Cardiovascular:     Rate and Rhythm: Normal rate and regular rhythm.     Pulses: Normal pulses.     Heart sounds: Normal heart sounds.  Pulmonary:     Effort: Pulmonary effort is normal.     Breath sounds: Normal breath sounds.  Abdominal:     General: Abdomen is flat.     Palpations: Abdomen is soft.  Musculoskeletal:     Cervical back: Normal range of motion.     Comments: Contracted, + L hip tender with range of motion but no obvious deformity, no spinal tenderness or deformity   Skin:    General: Skin is warm.     Capillary Refill: Capillary refill takes less than 2 seconds.  Neurological:     General: No focal deficit present.  Psychiatric:        Mood and Affect: Mood normal.        Behavior: Behavior normal.     ED Results / Procedures / Treatments   Labs (all labs ordered are listed, but only abnormal results are displayed) Labs Reviewed  GLUCOSE, CAPILLARY - Abnormal; Notable for the following components:      Result Value   Glucose-Capillary 151 (*)    All other components within normal limits  CBC WITH DIFFERENTIAL/PLATELET - Abnormal; Notable for the following components:   WBC 13.2 (*)    RBC 3.16 (*)    Hemoglobin 10.3 (*)    HCT 32.0 (*)     MCV 101.3 (*)    Neutro Abs 9.4 (*)    Monocytes Absolute 1.5 (*)    All other components within normal limits  COMPREHENSIVE METABOLIC PANEL - Abnormal; Notable for the following components:   Glucose, Bld 154 (*)    BUN 50 (*)  Creatinine, Ser 2.01 (*)    Albumin 2.9 (*)    GFR calc non Af Amer 21 (*)    GFR calc Af Amer 25 (*)    All other components within normal limits  RESPIRATORY PANEL BY RT PCR (FLU A&B, COVID)  CULTURE, BLOOD (ROUTINE X 2)  CULTURE, BLOOD (ROUTINE X 2)  URINE CULTURE  PROTIME-INR  LACTIC ACID, PLASMA  URINALYSIS, COMPLETE (UACMP) WITH MICROSCOPIC  CBG MONITORING, ED  TYPE AND SCREEN    EKG None  Radiology DG Chest 1 View  Result Date: 02/20/2019 CLINICAL DATA:  Initial evaluation for acute trauma, fall. EXAM: CHEST  1 VIEW COMPARISON:  Prior radiograph from 11/01/2018. FINDINGS: Mild cardiomegaly, stable. Mediastinal silhouette within normal limits. Aortic atherosclerosis. Chronic elevation of the right hemidiaphragm, stable from previous. Associated mild linear right basilar subsegmental atelectasis. No other focal airspace disease. No edema or effusion. No visible pneumothorax, although the patient's head partially obscures the right lung apex. No acute osseous abnormality. Diffuse osteopenia. Mild gaseous distension of the transverse colon noted within the upper abdomen. IMPRESSION: 1. No active cardiopulmonary disease. 2. Chronic elevation of the right hemidiaphragm with associated minimal right basilar subsegmental atelectasis. 3.  Aortic Atherosclerosis (ICD10-I70.0). Electronically Signed   By: Rise Mu M.D.   On: 02/20/2019 20:26   CT Head Wo Contrast  Result Date: 02/20/2019 CLINICAL DATA:  Recent fall 2 days ago with pain, initial encounter EXAM: CT HEAD WITHOUT CONTRAST CT CERVICAL SPINE WITHOUT CONTRAST TECHNIQUE: Multidetector CT imaging of the head and cervical spine was performed following the standard protocol without  intravenous contrast. Multiplanar CT image reconstructions of the cervical spine were also generated. COMPARISON:  12/20/2018 FINDINGS: CT HEAD FINDINGS Brain: Chronic atrophic and ischemic changes are again seen. No findings to suggest acute hemorrhage, acute infarction or space-occupying mass lesion are noted. Vascular: No hyperdense vessel or unexpected calcification. Skull: Normal. Negative for fracture or focal lesion. Sinuses/Orbits: No acute finding. Other: None. CT CERVICAL SPINE FINDINGS Alignment: Within normal limits. Skull base and vertebrae: 7 cervical segments are well visualized. Vertebral body height is well maintained. Multilevel facet hypertrophic changes are noted. No acute fracture or acute facet abnormality is noted. Mild osteophytic changes are seen. Disc space narrowing is also seen primarily at C5-6 and C6-7. Soft tissues and spinal canal: Surrounding soft tissue structures are within normal limits. Carotid calcifications are seen. Upper chest: Visualized lung apices are within normal limits. Other: None IMPRESSION: CT of the head: Chronic atrophic and ischemic changes without acute abnormality. CT of cervical spine: Multilevel degenerative change without acute abnormality. Electronically Signed   By: Alcide Clever M.D.   On: 02/20/2019 20:37   CT Cervical Spine Wo Contrast  Result Date: 02/20/2019 CLINICAL DATA:  Recent fall 2 days ago with pain, initial encounter EXAM: CT HEAD WITHOUT CONTRAST CT CERVICAL SPINE WITHOUT CONTRAST TECHNIQUE: Multidetector CT imaging of the head and cervical spine was performed following the standard protocol without intravenous contrast. Multiplanar CT image reconstructions of the cervical spine were also generated. COMPARISON:  12/20/2018 FINDINGS: CT HEAD FINDINGS Brain: Chronic atrophic and ischemic changes are again seen. No findings to suggest acute hemorrhage, acute infarction or space-occupying mass lesion are noted. Vascular: No hyperdense vessel or  unexpected calcification. Skull: Normal. Negative for fracture or focal lesion. Sinuses/Orbits: No acute finding. Other: None. CT CERVICAL SPINE FINDINGS Alignment: Within normal limits. Skull base and vertebrae: 7 cervical segments are well visualized. Vertebral body height is well maintained. Multilevel facet hypertrophic changes  are noted. No acute fracture or acute facet abnormality is noted. Mild osteophytic changes are seen. Disc space narrowing is also seen primarily at C5-6 and C6-7. Soft tissues and spinal canal: Surrounding soft tissue structures are within normal limits. Carotid calcifications are seen. Upper chest: Visualized lung apices are within normal limits. Other: None IMPRESSION: CT of the head: Chronic atrophic and ischemic changes without acute abnormality. CT of cervical spine: Multilevel degenerative change without acute abnormality. Electronically Signed   By: Inez Catalina M.D.   On: 02/20/2019 20:37   DG Hip Unilat W or Wo Pelvis 2-3 Views Left  Result Date: 02/20/2019 CLINICAL DATA:  Recent fall with hip pain, initial encounter EXAM: DG HIP (WITH OR WITHOUT PELVIS) 3V LEFT COMPARISON:  None. FINDINGS: Pelvic ring is intact. There is a subcapital femoral neck fracture with impaction and angulation at the fracture site. Postsurgical changes in the proximal right femur are noted. Degenerative changes of lumbar spine are seen. IMPRESSION: Left subcapital femoral neck fracture Electronically Signed   By: Inez Catalina M.D.   On: 02/20/2019 20:24    Procedures Procedures (including critical care time)  Medications Ordered in ED Medications  ceFAZolin (ANCEF) IVPB 2g/100 mL premix (has no administration in time range)  morphine 4 MG/ML injection 4 mg (4 mg Intravenous Given 02/20/19 2114)  acetaminophen (TYLENOL) suppository 650 mg (650 mg Rectal Given 02/20/19 2115)    ED Course  I have reviewed the triage vital signs and the nursing notes.  Pertinent labs & imaging results that  were available during my care of the patient were reviewed by me and considered in my medical decision making (see chart for details).    MDM Rules/Calculators/A&P                      Korrina Zern is a 86 y.o. female here with fall. Possible L hip fracture. Also is febrile in the ED. Consider sepsis from UTI vs pneumonia vs COVID. Will get cbc, cmp, lactate, cultures, UA, CXR, COVID, hip xrays, CT head/neck   8:50 PM Xray showed L hip fracture. Dr. Mack Guise from ortho to consult.   10:41 PM WBC is 13. COVID and UA pending. Hospitalist to admit.   Final Clinical Impression(s) / ED Diagnoses Final diagnoses:  None    Rx / DC Orders ED Discharge Orders    None       Drenda Freeze, MD 02/20/19 2241

## 2019-02-20 NOTE — Consult Note (Signed)
Called by Dr. Silverio Lay in ER regarding this 86 year old female with a displaced left femoral neck hip fracture after a fall.  Patient will require a left hip hemiarthroplasty to treat the fracture once medical clearance is obtained.  Patient should be NPO after midnight and should not received anticoagulation medication in preparation for surgery tomorrow pending medical clearance.   Full consult to follow in AM.

## 2019-02-21 ENCOUNTER — Other Ambulatory Visit: Payer: Self-pay

## 2019-02-21 ENCOUNTER — Inpatient Hospital Stay: Payer: Medicare Other | Admitting: Certified Registered Nurse Anesthetist

## 2019-02-21 ENCOUNTER — Encounter
Admission: EM | Disposition: A | Discharge status: Skilled Nursing Facility | Payer: Self-pay | Source: Home / Self Care | Attending: Internal Medicine

## 2019-02-21 ENCOUNTER — Inpatient Hospital Stay: Payer: Medicare Other

## 2019-02-21 DIAGNOSIS — R509 Fever, unspecified: Secondary | ICD-10-CM

## 2019-02-21 DIAGNOSIS — S72002A Fracture of unspecified part of neck of left femur, initial encounter for closed fracture: Secondary | ICD-10-CM

## 2019-02-21 HISTORY — PX: HIP ARTHROPLASTY: SHX981

## 2019-02-21 LAB — CBC
HCT: 31.1 % — ABNORMAL LOW (ref 36.0–46.0)
Hemoglobin: 9.7 g/dL — ABNORMAL LOW (ref 12.0–15.0)
MCH: 32.2 pg (ref 26.0–34.0)
MCHC: 31.2 g/dL (ref 30.0–36.0)
MCV: 103.3 fL — ABNORMAL HIGH (ref 80.0–100.0)
Platelets: 196 10*3/uL (ref 150–400)
RBC: 3.01 MIL/uL — ABNORMAL LOW (ref 3.87–5.11)
RDW: 12.6 % (ref 11.5–15.5)
WBC: 10.7 10*3/uL — ABNORMAL HIGH (ref 4.0–10.5)
nRBC: 0 % (ref 0.0–0.2)

## 2019-02-21 LAB — SURGICAL PCR SCREEN
MRSA, PCR: NEGATIVE
Staphylococcus aureus: NEGATIVE

## 2019-02-21 LAB — URINALYSIS, COMPLETE (UACMP) WITH MICROSCOPIC
Bilirubin Urine: NEGATIVE
Glucose, UA: NEGATIVE mg/dL
Ketones, ur: NEGATIVE mg/dL
Nitrite: NEGATIVE
Protein, ur: 100 mg/dL — AB
Specific Gravity, Urine: 1.012 (ref 1.005–1.030)
Squamous Epithelial / HPF: NONE SEEN (ref 0–5)
WBC, UA: >50 WBC/hpf — ABNORMAL HIGH (ref 0–5)
pH: 5 (ref 5.0–8.0)

## 2019-02-21 LAB — BASIC METABOLIC PANEL
Anion gap: 13 (ref 5–15)
BUN: 49 mg/dL — ABNORMAL HIGH (ref 8–23)
CO2: 23 mmol/L (ref 22–32)
Calcium: 8.5 mg/dL — ABNORMAL LOW (ref 8.9–10.3)
Chloride: 114 mmol/L — ABNORMAL HIGH (ref 98–111)
Creatinine, Ser: 1.75 mg/dL — ABNORMAL HIGH (ref 0.44–1.00)
GFR calc Af Amer: 29 mL/min — ABNORMAL LOW (ref >60)
GFR calc non Af Amer: 25 mL/min — ABNORMAL LOW (ref >60)
Glucose, Bld: 120 mg/dL — ABNORMAL HIGH (ref 70–99)
Potassium: 3.9 mmol/L (ref 3.5–5.1)
Sodium: 150 mmol/L — ABNORMAL HIGH (ref 135–145)

## 2019-02-21 LAB — TYPE AND SCREEN
ABO/RH(D): A POS
Antibody Screen: NEGATIVE

## 2019-02-21 LAB — TSH: TSH: 2.342 u[IU]/mL (ref 0.350–4.500)

## 2019-02-21 SURGERY — HEMIARTHROPLASTY, HIP, DIRECT ANTERIOR APPROACH, FOR FRACTURE
Anesthesia: Spinal | Site: Hip | Laterality: Left

## 2019-02-21 MED ORDER — DEXTROSE-NACL 5-0.45 % IV SOLN: INTRAVENOUS | Status: DC

## 2019-02-21 MED ORDER — METHOCARBAMOL 1000 MG/10ML IJ SOLN
500.0000 mg | Freq: Four times a day (QID) | INTRAVENOUS | Status: DC | PRN
  Filled 2019-02-21: qty 5

## 2019-02-21 MED ORDER — LACTATED RINGERS IV SOLN: INTRAVENOUS | Status: DC | PRN

## 2019-02-21 MED ORDER — MAGNESIUM CITRATE PO SOLN
1.0000 | Freq: Once | ORAL | Status: DC | PRN
  Filled 2019-02-21: qty 296

## 2019-02-21 MED ORDER — PROPOFOL 10 MG/ML IV BOLUS
INTRAVENOUS | Status: DC | PRN
  Administered 2019-02-21: 20 mg via INTRAVENOUS
  Administered 2019-02-21: 25 ug/kg/min via INTRAVENOUS

## 2019-02-21 MED ORDER — ENOXAPARIN SODIUM 30 MG/0.3ML ~~LOC~~ SOLN
30.0000 mg | SUBCUTANEOUS | Status: DC
  Administered 2019-02-22: 30 mg via SUBCUTANEOUS
  Filled 2019-02-21 (×2): qty 0.3

## 2019-02-21 MED ORDER — ONDANSETRON HCL 4 MG/2ML IJ SOLN: 4.0000 mg | Freq: Once | INTRAMUSCULAR | Status: DC | PRN

## 2019-02-21 MED ORDER — ONDANSETRON HCL 4 MG/2ML IJ SOLN: 4.0000 mg | Freq: Four times a day (QID) | INTRAMUSCULAR | Status: DC | PRN

## 2019-02-21 MED ORDER — BUPIVACAINE HCL (PF) 0.5 % IJ SOLN
INTRAMUSCULAR | Status: DC | PRN
  Administered 2019-02-21: 2.5 mL via INTRATHECAL

## 2019-02-21 MED ORDER — ONDANSETRON HCL 4 MG PO TABS: 4.0000 mg | ORAL_TABLET | Freq: Four times a day (QID) | ORAL | Status: DC | PRN

## 2019-02-21 MED ORDER — FERROUS SULFATE 325 (65 FE) MG PO TABS
325.0000 mg | ORAL_TABLET | Freq: Three times a day (TID) | ORAL | Status: DC
  Administered 2019-02-22 – 2019-03-03 (×24): 325 mg via ORAL
  Filled 2019-02-21 (×24): qty 1

## 2019-02-21 MED ORDER — POLYETHYLENE GLYCOL 3350 17 G PO PACK
17.0000 g | PACK | Freq: Every day | ORAL | Status: DC | PRN
  Administered 2019-03-02: 17 g via ORAL
  Filled 2019-02-21: qty 1

## 2019-02-21 MED ORDER — HYDROCODONE-ACETAMINOPHEN 5-325 MG PO TABS
1.0000 | ORAL_TABLET | ORAL | Status: DC | PRN
  Administered 2019-02-23: 1 via ORAL
  Filled 2019-02-21: qty 1

## 2019-02-21 MED ORDER — PHENOL 1.4 % MT LIQD
1.0000 | OROMUCOSAL | Status: DC | PRN
  Filled 2019-02-21: qty 177

## 2019-02-21 MED ORDER — ACETAMINOPHEN 325 MG PO TABS
325.0000 mg | ORAL_TABLET | Freq: Four times a day (QID) | ORAL | Status: DC | PRN
  Administered 2019-02-23: 325 mg via ORAL
  Administered 2019-02-27: 650 mg via ORAL
  Filled 2019-02-21 (×2): qty 2
  Filled 2019-02-21: qty 1

## 2019-02-21 MED ORDER — ACETAMINOPHEN 10 MG/ML IV SOLN
INTRAVENOUS | Status: AC
  Filled 2019-02-21: qty 100

## 2019-02-21 MED ORDER — FENTANYL CITRATE (PF) 100 MCG/2ML IJ SOLN
INTRAMUSCULAR | Status: DC | PRN
  Administered 2019-02-21 (×2): 12.5 ug via INTRAVENOUS
  Administered 2019-02-21 (×2): 25 ug via INTRAVENOUS

## 2019-02-21 MED ORDER — MENTHOL 3 MG MT LOZG
1.0000 | LOZENGE | OROMUCOSAL | Status: DC | PRN
  Filled 2019-02-21: qty 9

## 2019-02-21 MED ORDER — BISACODYL 10 MG RE SUPP
10.0000 mg | Freq: Every day | RECTAL | Status: DC | PRN
  Administered 2019-02-24: 07:00:00 10 mg via RECTAL
  Filled 2019-02-21: qty 1

## 2019-02-21 MED ORDER — LABETALOL HCL 5 MG/ML IV SOLN: 20.0000 mg | INTRAVENOUS | Status: DC | PRN

## 2019-02-21 MED ORDER — CEFAZOLIN SODIUM-DEXTROSE 1-4 GM/50ML-% IV SOLN
1.0000 g | Freq: Four times a day (QID) | INTRAVENOUS | Status: AC
  Administered 2019-02-21 – 2019-02-22 (×2): 1 g via INTRAVENOUS
  Filled 2019-02-21 (×2): qty 50

## 2019-02-21 MED ORDER — BUPIVACAINE HCL (PF) 0.5 % IJ SOLN
INTRAMUSCULAR | Status: AC
  Filled 2019-02-21: qty 10

## 2019-02-21 MED ORDER — CEFAZOLIN SODIUM-DEXTROSE 2-3 GM-%(50ML) IV SOLR
INTRAVENOUS | Status: DC | PRN
  Administered 2019-02-21: 2 g via INTRAVENOUS

## 2019-02-21 MED ORDER — SODIUM CHLORIDE 0.9 % IV SOLN: 75.0000 mL/h | INTRAVENOUS | Status: DC

## 2019-02-21 MED ORDER — FENTANYL CITRATE (PF) 100 MCG/2ML IJ SOLN: 25.0000 ug | INTRAMUSCULAR | Status: DC | PRN

## 2019-02-21 MED ORDER — SODIUM CHLORIDE 0.9 % IV SOLN
1.0000 g | INTRAVENOUS | Status: DC
  Administered 2019-02-21 – 2019-02-22 (×2): 1 g via INTRAVENOUS
  Filled 2019-02-21: qty 1
  Filled 2019-02-21: qty 10
  Filled 2019-02-21: qty 1

## 2019-02-21 MED ORDER — MORPHINE SULFATE (PF) 4 MG/ML IV SOLN: 0.5000 mg | INTRAVENOUS | Status: DC | PRN

## 2019-02-21 MED ORDER — ACETAMINOPHEN 10 MG/ML IV SOLN
INTRAVENOUS | Status: DC | PRN
  Administered 2019-02-21: 1000 mg via INTRAVENOUS

## 2019-02-21 MED ORDER — FENTANYL CITRATE (PF) 100 MCG/2ML IJ SOLN
INTRAMUSCULAR | Status: AC
  Filled 2019-02-21: qty 2

## 2019-02-21 MED ORDER — NEOMYCIN-POLYMYXIN B GU 40-200000 IR SOLN
Status: DC | PRN
  Administered 2019-02-21: 16 mL

## 2019-02-21 MED ORDER — TRAMADOL HCL 50 MG PO TABS
50.0000 mg | ORAL_TABLET | Freq: Four times a day (QID) | ORAL | Status: DC
  Administered 2019-02-21 – 2019-03-03 (×25): 50 mg via ORAL
  Filled 2019-02-21 (×25): qty 1

## 2019-02-21 MED ORDER — DOCUSATE SODIUM 100 MG PO CAPS
100.0000 mg | ORAL_CAPSULE | Freq: Two times a day (BID) | ORAL | Status: DC
  Administered 2019-02-22: 08:00:00 100 mg via ORAL
  Filled 2019-02-21 (×2): qty 1

## 2019-02-21 MED ORDER — HYDROCODONE-ACETAMINOPHEN 7.5-325 MG PO TABS: 1.0000 | ORAL_TABLET | ORAL | Status: DC | PRN

## 2019-02-21 MED ORDER — SENNA 8.6 MG PO TABS
1.0000 | ORAL_TABLET | Freq: Two times a day (BID) | ORAL | Status: DC
  Administered 2019-02-21 – 2019-03-03 (×17): 8.6 mg via ORAL
  Filled 2019-02-21 (×17): qty 1

## 2019-02-21 MED ORDER — ACETAMINOPHEN 500 MG PO TABS
500.0000 mg | ORAL_TABLET | Freq: Four times a day (QID) | ORAL | Status: AC
  Administered 2019-02-21 – 2019-02-22 (×3): 500 mg via ORAL
  Filled 2019-02-21 (×3): qty 1

## 2019-02-21 MED ORDER — CEFAZOLIN SODIUM-DEXTROSE 2-4 GM/100ML-% IV SOLN
INTRAVENOUS | Status: AC
  Filled 2019-02-21: qty 100

## 2019-02-21 MED ORDER — METHOCARBAMOL 500 MG PO TABS: 500.0000 mg | ORAL_TABLET | Freq: Four times a day (QID) | ORAL | Status: DC | PRN

## 2019-02-21 SURGICAL SUPPLY — 56 items
BLADE SAGITTAL WIDE XTHICK NO (BLADE) ×3 IMPLANT
BLADE SURG SZ10 CARB STEEL (BLADE) ×3 IMPLANT
BNDG COHESIVE 4X5 TAN STRL (GAUZE/BANDAGES/DRESSINGS) ×3 IMPLANT
CANISTER SUCT 1200ML W/VALVE (MISCELLANEOUS) ×3 IMPLANT
CANISTER SUCT 3000ML PPV (MISCELLANEOUS) ×6 IMPLANT
COVER BACK TABLE REUSABLE LG (DRAPES) ×3 IMPLANT
COVER WAND RF STERILE (DRAPES) ×3 IMPLANT
DRAPE 3/4 80X56 (DRAPES) ×6 IMPLANT
DRAPE INCISE IOBAN 66X60 STRL (DRAPES) ×3 IMPLANT
DRAPE SHEET LG 3/4 BI-LAMINATE (DRAPES) ×3 IMPLANT
DRAPE SPLIT 6X30 W/TAPE (DRAPES) ×6 IMPLANT
DRAPE SURG 17X11 SM STRL (DRAPES) ×3 IMPLANT
DRSG OPSITE POSTOP 4X10 (GAUZE/BANDAGES/DRESSINGS) ×3 IMPLANT
DURAPREP 26ML APPLICATOR (WOUND CARE) ×9 IMPLANT
ELECT BLADE 6.5 EXT (BLADE) ×3 IMPLANT
ELECT CAUTERY BLADE 6.4 (BLADE) ×3 IMPLANT
ELECT REM PT RETURN 9FT ADLT (ELECTROSURGICAL) ×3
ELECTRODE REM PT RTRN 9FT ADLT (ELECTROSURGICAL) ×1 IMPLANT
GAUZE SPONGE 4X4 12PLY STRL (GAUZE/BANDAGES/DRESSINGS) IMPLANT
GAUZE XEROFORM 1X8 LF (GAUZE/BANDAGES/DRESSINGS) IMPLANT
GLOVE BIOGEL PI IND STRL 9 (GLOVE) ×1 IMPLANT
GLOVE BIOGEL PI INDICATOR 9 (GLOVE) ×2
GLOVE SURG 9.0 ORTHO LTXF (GLOVE) ×6 IMPLANT
GOWN STRL REUS TWL 2XL XL LVL4 (GOWN DISPOSABLE) ×3 IMPLANT
GOWN STRL REUS W/ TWL LRG LVL3 (GOWN DISPOSABLE) ×1 IMPLANT
GOWN STRL REUS W/TWL LRG LVL3 (GOWN DISPOSABLE) ×2
HEAD MODULAR ENDO (Orthopedic Implant) ×2 IMPLANT
HEAD UNPLR 45XMDLR STRL HIP (Orthopedic Implant) ×1 IMPLANT
HEMOVAC 400ML (MISCELLANEOUS)
KIT DRAIN HEMOVAC JP 7FR 400ML (MISCELLANEOUS) IMPLANT
KIT TURNOVER KIT A (KITS) ×3 IMPLANT
NDL SAFETY ECLIPSE 18X1.5 (NEEDLE) ×1 IMPLANT
NEEDLE FILTER BLUNT 18X 1/2SAF (NEEDLE) ×2
NEEDLE FILTER BLUNT 18X1 1/2 (NEEDLE) ×1 IMPLANT
NEEDLE HYPO 18GX1.5 SHARP (NEEDLE) ×2
NEEDLE MAYO CATGUT SZ4 (NEEDLE) ×3 IMPLANT
NS IRRIG 1000ML POUR BTL (IV SOLUTION) ×3 IMPLANT
PACK HIP PROSTHESIS (MISCELLANEOUS) ×3 IMPLANT
PENCIL SMOKE EVACUATOR COATED (MISCELLANEOUS) ×3 IMPLANT
PILLOW ABDUCTION FOAM SM (MISCELLANEOUS) ×3 IMPLANT
PULSAVAC PLUS IRRIG FAN TIP (DISPOSABLE) ×3
RETRIEVER SUT HEWSON (MISCELLANEOUS) ×3 IMPLANT
SLEEVE UNITRAX V40 STD (Orthopedic Implant) ×3 IMPLANT
SOL .9 NS 3000ML IRR  AL (IV SOLUTION) ×2
SOL .9 NS 3000ML IRR UROMATIC (IV SOLUTION) ×1 IMPLANT
STAPLER SKIN PROX 35W (STAPLE) ×3 IMPLANT
STEM HIP 4 127DEG (Stem) ×3 IMPLANT
SUT TICRON 2-0 30IN 311381 (SUTURE) ×12 IMPLANT
SUT VIC AB 0 CT1 36 (SUTURE) ×6 IMPLANT
SUT VIC AB 2-0 CT2 27 (SUTURE) ×6 IMPLANT
SYR 10ML LL (SYRINGE) ×3 IMPLANT
TAPE MICROFOAM 4IN (TAPE) IMPLANT
TAPE TRANSPORE STRL 2 31045 (GAUZE/BANDAGES/DRESSINGS) ×3 IMPLANT
TIP BRUSH PULSAVAC PLUS 24.33 (MISCELLANEOUS) ×3 IMPLANT
TIP FAN IRRIG PULSAVAC PLUS (DISPOSABLE) ×1 IMPLANT
TUBE SUCT KAM VAC (TUBING) IMPLANT

## 2019-02-21 NOTE — TOC Initial Note (Signed)
Transition of Care [facility]) - Initial/Assessment Note    Patient Details  Name: Sabrina Burke MRN: [id number] Date of Birth: 1927/08/03  [facility]) CM/SW Contact:    Lucy Chris, LCSW Phone Number: 02/21/2019, 11:08 AM  Clinical Narrative:  Spoke with Lea-daughter to obtain information regarding pt's living situation. She has lived at [facility] since 08/2017 and has done well. The plan is to go to rehab then return back to [facility] once completed. Needs to work on transfers and stengthening, was non-ambulatory prior to admission. Daughter has no preference regarding SNF,  Has not been to one here, but back in [place]. Will begin [facility] and [facility] and start bed search.                Expected Discharge Plan: Skilled Nursing Facility Barriers to Discharge: Continued Medical Work up   Patient Goals and CMS Choice Patient states their goals for this hospitalization and ongoing recovery are:: Plan to go to SNF then back to [facility] where she has been living with 08/2017      Expected Discharge Plan and Services Expected Discharge Plan: Skilled Nursing Facility In-house Referral: Clinical Social Work Discharge Planning Services: NA   Living arrangements for the past 2 months: Assisted Living Facility([facility])                                      Prior Living Arrangements/Services Living arrangements for the past 2 months: Assisted Living Facility([facility]) Lives with:: Facility Resident                   Activities of Daily Living   ADL Screening (condition at time of admission) Patient's cognitive ability adequate to safely complete daily activities?: No Is the patient deaf or have difficulty hearing?: Yes Does the patient have difficulty seeing, even when wearing glasses/contacts?: Yes Does the patient have difficulty concentrating, remembering, or making decisions?: Yes Does the patient have difficulty dressing or  bathing?: Yes Does the patient have difficulty walking or climbing stairs?: Yes Weakness of Legs: Both Weakness of Arms/Hands: Both  Permission Sought/Granted Permission sought to share information with : Oceanographer granted to share information with : Yes, Verbal Permission Granted  Share Information with NAME: Administrator  Permission granted to share info w AGENCY: [facility]  Permission granted to share info w Relationship: Daughter  Permission granted to share info w Contact Information: lea  Emotional Assessment Appearance:: Appears stated age Attitude/Demeanor/Rapport: Unable to Assess(due to dementia) Affect (typically observed): Calm Orientation: : Oriented to Self      Admission diagnosis:  Closed left hip fracture (HCC) [S72.002A] Closed fracture of left hip, initial encounter (HCC) [S72.002A] Fever, unspecified fever cause [R50.9] Patient Active Problem List   Diagnosis Date Noted  . Closed left hip fracture (HCC) 02/20/2019  . Sepsis (HCC) 06/26/2018   PCP:  Devoria Glassing, NP Pharmacy:  No Pharmacies Listed    Social Determinants of Health (SDOH) Interventions    Readmission Risk Interventions No flowsheet data found.

## 2019-02-21 NOTE — Progress Notes (Signed)
Subjective: Patient denied having much pain this morning.  Was lying on bed on her right side appeared calm.  Planning to go for left hip hemiarthroplasty this afternoon.  Developed a fever of 100.9 F at noontime today.  Objective: Vital signs in last 24 hours: Temp:  [98 F (36.7 C)-102 F (38.9 C)] 100 F (37.8 C) (02/01 1208) Pulse Rate:  [76-108] 94 (02/01 1205) Resp:  [12-32] 12 (02/01 1205) BP: (118-179)/(50-84) 129/66 (02/01 1205) SpO2:  [90 %-96 %] 91 % (02/01 1205) Weight:  [45.4 kg] 45.4 kg (01/31 1951)  Intake/Output from previous day: 01/31 0701 - 02/01 0700 In: 170.3 [I.V.:170.3] Out: 550 [Urine:550] Intake/Output this shift: No intake/output data recorded.  GENERAL:  lying in the bed with no acute distress.  EYES: No scleral icterus. Extraocular muscles intact.  HEENT: Head atraumatic, normocephalic. Oropharynx and nasopharynx clear.  NECK:  Supple, no jugular venous distention. No thyroid enlargement, no tenderness.  LUNGS: Normal breath sounds bilaterally, no wheezing, rales,rhonchi or crepitation. No use of accessory muscles of respiration.  CARDIOVASCULAR: Tachycardic; regular rhythm, S1, S2 normal. No murmurs, rubs, or gallops.  ABDOMEN: Soft, nondistended, nontender. Bowel sounds present. No organomegaly or mass.  EXTREMITIES: Left hip/thigh tenderness on movement; ROM compromised; No pedal edema, cyanosis, or clubbing.  NEUROLOGIC:  Muscle strength 5/5 in all extremities except left hip, leg. Sensation intact. Gait not checked.  PSYCHIATRIC: The patient is alert and oriented x 2 SKIN: No obvious rash, lesion, or ulcer.   Results for orders placed or performed during the hospital encounter of 02/20/19 (from the past 24 hour(s))  Glucose, capillary     Status: Abnormal   Collection Time: 02/20/19  7:55 PM  Result Value Ref Range   Glucose-Capillary 151 (H) 70 - 99 mg/dL  Respiratory Panel by RT PCR (Flu A&B, Covid) - Nasopharyngeal Swab     Status: None    Collection Time: 02/20/19  8:36 PM   Specimen: Nasopharyngeal Swab  Result Value Ref Range   SARS Coronavirus 2 by RT PCR NEGATIVE NEGATIVE   Influenza A by PCR NEGATIVE NEGATIVE   Influenza B by PCR NEGATIVE NEGATIVE  CBC with Differential/Platelet     Status: Abnormal   Collection Time: 02/20/19  8:53 PM  Result Value Ref Range   WBC 13.2 (H) 4.0 - 10.5 K/uL   RBC 3.16 (L) 3.87 - 5.11 MIL/uL   Hemoglobin 10.3 (L) 12.0 - 15.0 g/dL   HCT 63.0 (L) 16.0 - 10.9 %   MCV 101.3 (H) 80.0 - 100.0 fL   MCH 32.6 26.0 - 34.0 pg   MCHC 32.2 30.0 - 36.0 g/dL   RDW 32.3 55.7 - 32.2 %   Platelets 266 150 - 400 K/uL   nRBC 0.0 0.0 - 0.2 %   Neutrophils Relative % 72 %   Neutro Abs 9.4 (H) 1.7 - 7.7 K/uL   Lymphocytes Relative 17 %   Lymphs Abs 2.2 0.7 - 4.0 K/uL   Monocytes Relative 11 %   Monocytes Absolute 1.5 (H) 0.1 - 1.0 K/uL   Eosinophils Relative 0 %   Eosinophils Absolute 0.1 0.0 - 0.5 K/uL   Basophils Relative 0 %   Basophils Absolute 0.1 0.0 - 0.1 K/uL   Immature Granulocytes 0 %   Abs Immature Granulocytes 0.05 0.00 - 0.07 K/uL  Comprehensive metabolic panel     Status: Abnormal   Collection Time: 02/20/19  8:53 PM  Result Value Ref Range   Sodium 145 135 - 145 mmol/L  Potassium 4.0 3.5 - 5.1 mmol/L   Chloride 109 98 - 111 mmol/L   CO2 26 22 - 32 mmol/L   Glucose, Bld 154 (H) 70 - 99 mg/dL   BUN 50 (H) 8 - 23 mg/dL   Creatinine, Ser 5.45 (H) 0.44 - 1.00 mg/dL   Calcium 8.9 8.9 - 62.5 mg/dL   Total Protein 7.2 6.5 - 8.1 g/dL   Albumin 2.9 (L) 3.5 - 5.0 g/dL   AST 27 15 - 41 U/L   ALT 22 0 - 44 U/L   Alkaline Phosphatase 77 38 - 126 U/L   Total Bilirubin 0.6 0.3 - 1.2 mg/dL   GFR calc non Af Amer 21 (L) >60 mL/min   GFR calc Af Amer 25 (L) >60 mL/min   Anion gap 10 5 - 15  Protime-INR     Status: None   Collection Time: 02/20/19  8:53 PM  Result Value Ref Range   Prothrombin Time 14.5 11.4 - 15.2 seconds   INR 1.1 0.8 - 1.2  Blood culture (routine x 2)     Status:  None (Preliminary result)   Collection Time: 02/20/19  8:53 PM   Specimen: BLOOD  Result Value Ref Range   Specimen Description BLOOD RIGHT ANTECUBITAL    Special Requests      BOTTLES DRAWN AEROBIC AND ANAEROBIC Blood Culture results may not be optimal due to an inadequate volume of blood received in culture bottles   Culture      NO GROWTH < 12 HOURS Performed at [facility], [address] [place], [place] [address]    Report Status PENDING   Blood culture (routine x 2)     Status: None (Preliminary result)   Collection Time: 02/20/19  8:53 PM   Specimen: BLOOD  Result Value Ref Range   Specimen Description BLOOD BLOOD LEFT HAND    Special Requests      BOTTLES DRAWN AEROBIC AND ANAEROBIC Blood Culture adequate volume   Culture      NO GROWTH < 12 HOURS Performed at [facility], [address] [place], [place] [address]    Report Status PENDING   Lactic acid, plasma     Status: None   Collection Time: 02/20/19  8:53 PM  Result Value Ref Range   Lactic Acid, Venous 1.1 0.5 - 1.9 mmol/L  Type and screen     Status: None   Collection Time: 02/20/19 11:44 PM  Result Value Ref Range   ABO/RH(D) A POS    Antibody Screen NEG    Sample Expiration      02/23/2019,2359 Performed at [facility], [address] [place], [place] [address]   Urinalysis, Complete w Microscopic     Status: Abnormal   Collection Time: 02/20/19 11:44 PM  Result Value Ref Range   Color, Urine YELLOW (A) YELLOW   APPearance CLOUDY (A) CLEAR   Specific Gravity, Urine 1.012 1.005 - 1.030   pH 5.0 5.0 - 8.0   Glucose, UA NEGATIVE NEGATIVE mg/dL   Hgb urine dipstick SMALL (A) NEGATIVE   Bilirubin Urine NEGATIVE NEGATIVE   Ketones, ur NEGATIVE NEGATIVE mg/dL   Protein, ur 572 (A) NEGATIVE mg/dL   Nitrite NEGATIVE NEGATIVE   Leukocytes,Ua LARGE (A) NEGATIVE   RBC / HPF 11-20 0 - 5 RBC/hpf   WBC, UA >50 (H) 0 - 5 WBC/hpf   Bacteria, UA MANY (A) NONE SEEN   Squamous  Epithelial / LPF NONE SEEN 0 - 5  WBC Clumps PRESENT    Mucus PRESENT   Surgical pcr screen     Status: None   Collection Time: 02/21/19 12:50 AM   Specimen: Nasal Mucosa; Nasal Swab  Result Value Ref Range   MRSA, PCR NEGATIVE NEGATIVE   Staphylococcus aureus NEGATIVE NEGATIVE  Basic metabolic panel     Status: Abnormal   Collection Time: 02/21/19  4:42 AM  Result Value Ref Range   Sodium 150 (H) 135 - 145 mmol/L   Potassium 3.9 3.5 - 5.1 mmol/L   Chloride 114 (H) 98 - 111 mmol/L   CO2 23 22 - 32 mmol/L   Glucose, Bld 120 (H) 70 - 99 mg/dL   BUN 49 (H) 8 - 23 mg/dL   Creatinine, Ser 1.611.75 (H) 0.44 - 1.00 mg/dL   Calcium 8.5 (L) 8.9 - 10.3 mg/dL   GFR calc non Af Amer 25 (L) >60 mL/min   GFR calc Af Amer 29 (L) >60 mL/min   Anion gap 13 5 - 15  CBC     Status: Abnormal   Collection Time: 02/21/19  4:42 AM  Result Value Ref Range   WBC 10.7 (H) 4.0 - 10.5 K/uL   RBC 3.01 (L) 3.87 - 5.11 MIL/uL   Hemoglobin 9.7 (L) 12.0 - 15.0 g/dL   HCT 09.631.1 (L) 04.536.0 - 40.946.0 %   MCV 103.3 (H) 80.0 - 100.0 fL   MCH 32.2 26.0 - 34.0 pg   MCHC 31.2 30.0 - 36.0 g/dL   RDW 81.112.6 91.411.5 - 78.215.5 %   Platelets 196 150 - 400 K/uL   nRBC 0.0 0.0 - 0.2 %  TSH     Status: None   Collection Time: 02/21/19  4:42 AM  Result Value Ref Range   TSH 2.342 0.350 - 4.500 uIU/mL    Studies/Results: DG Chest 1 View  Result Date: 02/20/2019 CLINICAL DATA:  Initial evaluation for acute trauma, fall. EXAM: CHEST  1 VIEW COMPARISON:  Prior radiograph from 11/01/2018. FINDINGS: Mild cardiomegaly, stable. Mediastinal silhouette within normal limits. Aortic atherosclerosis. Chronic elevation of the right hemidiaphragm, stable from previous. Associated mild linear right basilar subsegmental atelectasis. No other focal airspace disease. No edema or effusion. No visible pneumothorax, although the patient's head partially obscures the right lung apex. No acute osseous abnormality. Diffuse osteopenia. Mild gaseous distension of  the transverse colon noted within the upper abdomen. IMPRESSION: 1. No active cardiopulmonary disease. 2. Chronic elevation of the right hemidiaphragm with associated minimal right basilar subsegmental atelectasis. 3.  Aortic Atherosclerosis (ICD10-I70.0). Electronically Signed   By: Rise MuBenjamin  McClintock M.D.   On: 02/20/2019 20:26   CT Head Wo Contrast  Result Date: 02/20/2019 CLINICAL DATA:  Recent fall 2 days ago with pain, initial encounter EXAM: CT HEAD WITHOUT CONTRAST CT CERVICAL SPINE WITHOUT CONTRAST TECHNIQUE: Multidetector CT imaging of the head and cervical spine was performed following the standard protocol without intravenous contrast. Multiplanar CT image reconstructions of the cervical spine were also generated. COMPARISON:  12/20/2018 FINDINGS: CT HEAD FINDINGS Brain: Chronic atrophic and ischemic changes are again seen. No findings to suggest acute hemorrhage, acute infarction or space-occupying mass lesion are noted. Vascular: No hyperdense vessel or unexpected calcification. Skull: Normal. Negative for fracture or focal lesion. Sinuses/Orbits: No acute finding. Other: None. CT CERVICAL SPINE FINDINGS Alignment: Within normal limits. Skull base and vertebrae: 7 cervical segments are well visualized. Vertebral body height is well maintained. Multilevel facet hypertrophic changes are noted. No acute fracture or acute facet abnormality is noted.  Mild osteophytic changes are seen. Disc space narrowing is also seen primarily at C5-6 and C6-7. Soft tissues and spinal canal: Surrounding soft tissue structures are within normal limits. Carotid calcifications are seen. Upper chest: Visualized lung apices are within normal limits. Other: None IMPRESSION: CT of the head: Chronic atrophic and ischemic changes without acute abnormality. CT of cervical spine: Multilevel degenerative change without acute abnormality. Electronically Signed   By: Alcide Clever M.D.   On: 02/20/2019 20:37   CT Cervical Spine  Wo Contrast  Result Date: 02/20/2019 CLINICAL DATA:  Recent fall 2 days ago with pain, initial encounter EXAM: CT HEAD WITHOUT CONTRAST CT CERVICAL SPINE WITHOUT CONTRAST TECHNIQUE: Multidetector CT imaging of the head and cervical spine was performed following the standard protocol without intravenous contrast. Multiplanar CT image reconstructions of the cervical spine were also generated. COMPARISON:  12/20/2018 FINDINGS: CT HEAD FINDINGS Brain: Chronic atrophic and ischemic changes are again seen. No findings to suggest acute hemorrhage, acute infarction or space-occupying mass lesion are noted. Vascular: No hyperdense vessel or unexpected calcification. Skull: Normal. Negative for fracture or focal lesion. Sinuses/Orbits: No acute finding. Other: None. CT CERVICAL SPINE FINDINGS Alignment: Within normal limits. Skull base and vertebrae: 7 cervical segments are well visualized. Vertebral body height is well maintained. Multilevel facet hypertrophic changes are noted. No acute fracture or acute facet abnormality is noted. Mild osteophytic changes are seen. Disc space narrowing is also seen primarily at C5-6 and C6-7. Soft tissues and spinal canal: Surrounding soft tissue structures are within normal limits. Carotid calcifications are seen. Upper chest: Visualized lung apices are within normal limits. Other: None IMPRESSION: CT of the head: Chronic atrophic and ischemic changes without acute abnormality. CT of cervical spine: Multilevel degenerative change without acute abnormality. Electronically Signed   By: Alcide Clever M.D.   On: 02/20/2019 20:37   DG Hip Unilat W or Wo Pelvis 2-3 Views Left  Result Date: 02/20/2019 CLINICAL DATA:  Recent fall with hip pain, initial encounter EXAM: DG HIP (WITH OR WITHOUT PELVIS) 3V LEFT COMPARISON:  None. FINDINGS: Pelvic ring is intact. There is a subcapital femoral neck fracture with impaction and angulation at the fracture site. Postsurgical changes in the proximal  right femur are noted. Degenerative changes of lumbar spine are seen. IMPRESSION: Left subcapital femoral neck fracture Electronically Signed   By: Alcide Clever M.D.   On: 02/20/2019 20:24    Scheduled Meds: . [MAR Hold] amLODipine  10 mg Oral Daily  . [MAR Hold] cholecalciferol  2,000 Units Oral Daily  . [MAR Hold] docusate sodium  100 mg Oral QHS  . [MAR Hold] feeding supplement (ENSURE ENLIVE)  237 mL Oral BID BM  . [MAR Hold] levothyroxine  88 mcg Oral QAC breakfast  . [MAR Hold] LORazepam  0.25 mg Oral BID  . [MAR Hold] multivitamin with minerals   Oral Daily   Continuous Infusions: . sodium chloride 100 mL/hr at 02/21/19 0105  . [MAR Hold]  ceFAZolin (ANCEF) IV    . [MAR Hold] cefTRIAXone (ROCEPHIN)  IV 1 g (02/21/19 0332)   PRN Meds:[MAR Hold] acetaminophen **OR** [MAR Hold] acetaminophen, [MAR Hold] alum & mag hydroxide-simeth, [MAR Hold] bisacodyl, [MAR Hold] labetalol, [MAR Hold] loperamide, [MAR Hold] LORazepam, [MAR Hold] ondansetron **OR** [MAR Hold] ondansetron (ZOFRAN) IV, [MAR Hold] oxyCODONE, [MAR Hold] polyethylene glycol, [MAR Hold] traZODone  Assessment/Plan:  1.  Left hip fracture secondary to mechanical fall.    Intermittent pain - Pain management   - Cont IV hydration  -  Immobilization for now  -  Orthopedic consultation, Dr. Mack Guise notified. Pt is likely to go to OR for left hip hemiarthroplasty this afternoon. Appreciate Orthopedic Surgery assistance/care.  - Perioperative risk assessment: Functional Status of this Patient <= 4 METS. And Revised Cardiac Risk Index for Pre-Operative Risk: 2 points, which is 10.1 % 30-day risk of death, MI, or cardiac arrest.  2.  UTI with subsequent sepsis: Developed another spike of 100.9 F on 02/21/2019 - Improving leukocytosis admission - Cont  IV Rocephin - Follow urine culture and blood culture: Prelim report showing no growth so far  3.  Hypertensive urgency: Resolved now  -  Cont  IV labetalol PRN - continue  amlodipine.  4.  Acute kidney injury superimposed on stage IIIb chronic kidney disease.  -- Improving following hydration  - Cont IV normal  - Follow BMP - Avoid potential nephrotoxins  5.  Hypothyroidism.  -- TSH WNL  - Continue home Synthroid.  6.  DVT prophylaxis.  SCDs for now.  Medical prophylaxis will be held off for postoperative period.   LOS: 1 day   Yer Olivencia Izetta Dakin

## 2019-02-21 NOTE — Anesthesia Procedure Notes (Signed)
Spinal  Patient location during procedure: OR Start time: 02/21/2019 1:09 PM End time: 02/21/2019 1:14 PM Staffing Performed: anesthesiologist  Anesthesiologist: Emmie Niemann, MD Preanesthetic Checklist Completed: patient identified, IV checked, site marked, risks and benefits discussed, surgical consent, monitors and equipment checked, pre-op evaluation and timeout performed Spinal Block Patient position: sitting Prep: ChloraPrep Patient monitoring: heart rate, continuous pulse ox and blood pressure Approach: midline Location: L4-5 Injection technique: single-shot Needle Needle type: Introducer and Pencil-Tip  Needle gauge: 24 G Needle length: 9 cm Additional Notes Negative paresthesia. Negative blood return. Positive free-flowing CSF. Expiration date of kit checked and confirmed. Patient tolerated procedure well, without complications.

## 2019-02-21 NOTE — Anesthesia Procedure Notes (Signed)
Procedure Name: MAC Performed by: Didi Ganaway, CRNA Pre-anesthesia Checklist: Patient identified, Emergency Drugs available, Suction available, Patient being monitored and Timeout performed Oxygen Delivery Method: Simple face mask       

## 2019-02-21 NOTE — Consult Note (Signed)
ORTHOPAEDIC CONSULTATION  REQUESTING PHYSICIAN: Thomasenia Bottoms, MD  Chief Complaint: Left hip pain status post fall  HPI: Sabrina Burke is a 86 y.o. female who was admitted to the hospital service overnight after being diagnosed with a left femoral neck hip fracture.  The patient is reported to have had a fall over the past few days and had increasing pain in the left hip.  Patient is nonambulatory at baseline.  She is a resident at [facility] here in [place].  Orthopedics is consulted for management of the patient's left hip fracture.  Patient has advanced dementia and is unable to provide a history.  I have spoken with the patient's daughter, Sabrina Burke, by phone.  Past Medical History:  Diagnosis Date  . Dementia (HCC)   . Hypertension    History reviewed. No pertinent surgical history. Social History   Socioeconomic History  . Marital status: Widowed    Spouse name: Not on file  . Number of children: Not on file  . Years of education: Not on file  . Highest education level: Not on file  Occupational History  . Not on file  Tobacco Use  . Smoking status: Never Smoker  . Smokeless tobacco: Never Used  . Tobacco comment: pt with advance dementia  Substance and Sexual Activity  . Alcohol use: Not Currently  . Drug use: Not Currently  . Sexual activity: Not on file  Other Topics Concern  . Not on file  Social History Narrative  . Not on file   Social Determinants of Health   Financial Resource Strain:   . Difficulty of Paying Living Expenses: Not on file  Food Insecurity:   . Worried About Programme researcher, broadcasting/film/video in the Last Year: Not on file  . Ran Out of Food in the Last Year: Not on file  Transportation Needs:   . Lack of Transportation (Medical): Not on file  . Lack of Transportation (Non-Medical): Not on file  Physical Activity:   . Days of Exercise per Week: Not on file  . Minutes of Exercise per Session: Not on file  Stress:   . Feeling of Stress :  Not on file  Social Connections:   . Frequency of Communication with Friends and Family: Not on file  . Frequency of Social Gatherings with Friends and Family: Not on file  . Attends Religious Services: Not on file  . Active Member of Clubs or Organizations: Not on file  . Attends Banker Meetings: Not on file  . Marital Status: Not on file   History reviewed. No pertinent family history. No Known Allergies Prior to Admission medications   Medication Sig Start Date End Date Taking? Authorizing Provider  acetaminophen (TYLENOL) 325 MG tablet Take 325 mg by mouth every 6 (six) hours as needed for fever.    Yes [provider]  alum & mag hydroxide-simeth (MAALOX/MYLANTA) 200-200-20 MG/5ML suspension Take 30 mLs by mouth as needed for indigestion or heartburn (max 4 doses in 24 hours).    Yes [provider]  amLODipine (NORVASC) 10 MG tablet Take 1 tablet (10 mg total) by mouth daily. 06/28/18  Yes Wieting, Richard, MD  aspirin EC 81 MG EC tablet Take 1 tablet (81 mg total) by mouth daily. 06/28/18  Yes Wieting, Richard, MD  bisacodyl (DULCOLAX) 5 MG EC tablet Take 5 mg by mouth every other day as needed for moderate constipation.    Yes [provider]  Cholecalciferol (VITAMIN D3) 50 MCG (2000  UT) TABS Take 2,000 Units by mouth daily.    Yes [provider]  docusate sodium (COLACE) 100 MG capsule Take 100 mg by mouth at bedtime.   Yes [provider]  furosemide (LASIX) 20 MG tablet Take 20 mg by mouth daily.   Yes [provider]  guaiFENesin (ROBITUSSIN) 100 MG/5ML liquid Take 200 mg by mouth every 6 (six) hours as needed for cough.    Yes [provider]  Infant Care Products (DERMACLOUD) CREA Apply 1 application topically 3 (three) times daily. Apply to the buttocks and sacrum after each diaper change. Every shift: 0700-1500, 1500-2300, 2300-0700   Yes [provider]  levothyroxine (SYNTHROID) 88 MCG tablet  Take 88 mcg by mouth daily before breakfast.    Yes [provider]  loperamide (IMODIUM) 2 MG capsule Take 2 mg by mouth as needed for diarrhea or loose stools (max 8 doses in 24 hours).    Yes [provider]  LORazepam (ATIVAN) 0.5 MG tablet Take 0.25 mg by mouth 2 (two) times daily as needed for anxiety.   Yes [provider]  LORazepam (ATIVAN) 0.5 MG tablet Take 0.5 tablets (0.25 mg total) by mouth 2 (two) times daily. Takes at 1400 and 2100 10/24/18  Yes Gouru, Aruna, MD  magnesium hydroxide (MILK OF MAGNESIA) 400 MG/5ML suspension Take 30 mLs by mouth at bedtime as needed for mild constipation.    Yes [provider]  Multiple Vitamin (DAILY VITE PO) Take 1 tablet by mouth daily.   Yes [provider]  neomycin-bacitracin-polymyxin (NEOSPORIN) ointment Apply 1 application topically as needed for wound care.   Yes [provider]  polyethylene glycol (MIRALAX / GLYCOLAX) 17 g packet Take 17 g by mouth daily as needed for mild constipation. 10/24/18  Yes Gouru, Illene Silver, MD  potassium chloride (KLOR-CON) 20 MEQ packet Take 20 mEq by mouth daily. 08/24/18  Yes Wieting, Richard, MD  ciprofloxacin (CIPRO) 500 MG tablet Take 1 tablet (500 mg total) by mouth daily at 6 PM. Patient not taking: Reported on 02/21/2019 10/24/18   Nicholes Mango, MD  feeding supplement, ENSURE ENLIVE, (ENSURE ENLIVE) LIQD Take 237 mLs by mouth 2 (two) times daily between meals. 06/28/18   Loletha Grayer, MD   DG Chest 1 View  Result Date: 02/20/2019 CLINICAL DATA:  Initial evaluation for acute trauma, fall. EXAM: CHEST  1 VIEW COMPARISON:  Prior radiograph from 11/01/2018. FINDINGS: Mild cardiomegaly, stable. Mediastinal silhouette within normal limits. Aortic atherosclerosis. Chronic elevation of the right hemidiaphragm, stable from previous. Associated mild linear right basilar subsegmental atelectasis. No other focal airspace disease. No edema or effusion. No visible  pneumothorax, although the patient's head partially obscures the right lung apex. No acute osseous abnormality. Diffuse osteopenia. Mild gaseous distension of the transverse colon noted within the upper abdomen. IMPRESSION: 1. No active cardiopulmonary disease. 2. Chronic elevation of the right hemidiaphragm with associated minimal right basilar subsegmental atelectasis. 3.  Aortic Atherosclerosis (ICD10-I70.0). Electronically Signed   By: Jeannine Boga M.D.   On: 02/20/2019 20:26   CT Head Wo Contrast  Result Date: 02/20/2019 CLINICAL DATA:  Recent fall 2 days ago with pain, initial encounter EXAM: CT HEAD WITHOUT CONTRAST CT CERVICAL SPINE WITHOUT CONTRAST TECHNIQUE: Multidetector CT imaging of the head and cervical spine was performed following the standard protocol without intravenous contrast. Multiplanar CT image reconstructions of the cervical spine were also generated. COMPARISON:  12/20/2018 FINDINGS: CT HEAD FINDINGS Brain: Chronic atrophic and ischemic changes are again  seen. No findings to suggest acute hemorrhage, acute infarction or space-occupying mass lesion are noted. Vascular: No hyperdense vessel or unexpected calcification. Skull: Normal. Negative for fracture or focal lesion. Sinuses/Orbits: No acute finding. Other: None. CT CERVICAL SPINE FINDINGS Alignment: Within normal limits. Skull base and vertebrae: 7 cervical segments are well visualized. Vertebral body height is well maintained. Multilevel facet hypertrophic changes are noted. No acute fracture or acute facet abnormality is noted. Mild osteophytic changes are seen. Disc space narrowing is also seen primarily at C5-6 and C6-7. Soft tissues and spinal canal: Surrounding soft tissue structures are within normal limits. Carotid calcifications are seen. Upper chest: Visualized lung apices are within normal limits. Other: None IMPRESSION: CT of the head: Chronic atrophic and ischemic changes without acute abnormality. CT of  cervical spine: Multilevel degenerative change without acute abnormality. Electronically Signed   By: Alcide Clever M.D.   On: 02/20/2019 20:37   CT Cervical Spine Wo Contrast  Result Date: 02/20/2019 CLINICAL DATA:  Recent fall 2 days ago with pain, initial encounter EXAM: CT HEAD WITHOUT CONTRAST CT CERVICAL SPINE WITHOUT CONTRAST TECHNIQUE: Multidetector CT imaging of the head and cervical spine was performed following the standard protocol without intravenous contrast. Multiplanar CT image reconstructions of the cervical spine were also generated. COMPARISON:  12/20/2018 FINDINGS: CT HEAD FINDINGS Brain: Chronic atrophic and ischemic changes are again seen. No findings to suggest acute hemorrhage, acute infarction or space-occupying mass lesion are noted. Vascular: No hyperdense vessel or unexpected calcification. Skull: Normal. Negative for fracture or focal lesion. Sinuses/Orbits: No acute finding. Other: None. CT CERVICAL SPINE FINDINGS Alignment: Within normal limits. Skull base and vertebrae: 7 cervical segments are well visualized. Vertebral body height is well maintained. Multilevel facet hypertrophic changes are noted. No acute fracture or acute facet abnormality is noted. Mild osteophytic changes are seen. Disc space narrowing is also seen primarily at C5-6 and C6-7. Soft tissues and spinal canal: Surrounding soft tissue structures are within normal limits. Carotid calcifications are seen. Upper chest: Visualized lung apices are within normal limits. Other: None IMPRESSION: CT of the head: Chronic atrophic and ischemic changes without acute abnormality. CT of cervical spine: Multilevel degenerative change without acute abnormality. Electronically Signed   By: Alcide Clever M.D.   On: 02/20/2019 20:37   DG Hip Unilat W or Wo Pelvis 2-3 Views Left  Result Date: 02/20/2019 CLINICAL DATA:  Recent fall with hip pain, initial encounter EXAM: DG HIP (WITH OR WITHOUT PELVIS) 3V LEFT COMPARISON:  None.  FINDINGS: Pelvic ring is intact. There is a subcapital femoral neck fracture with impaction and angulation at the fracture site. Postsurgical changes in the proximal right femur are noted. Degenerative changes of lumbar spine are seen. IMPRESSION: Left subcapital femoral neck fracture Electronically Signed   By: Alcide Clever M.D.   On: 02/20/2019 20:24    Positive ROS: All other systems have been reviewed and were otherwise negative with the exception of those mentioned in the HPI and as above.  Physical Exam: General: Alert, no acute distress  MUSCULOSKELETAL: Left lower extremity: Patient is lying on her right side.  The left hip and knee are flexed.  Patient's skin is intact.  There is no erythema or ecchymosis seen.  Patient's thigh compartments are soft and compressible.  She has palpable pedal pulses.  She is spontaneous movement of her left toes and ankle.  Given the dementia it is not possible to assess the patient's sensation.  Assessment: Displaced left femoral neck hip fracture  Plan: I discussed with the patient's daughter this morning her fracture.  I have recommended a left hip hemiarthroplasty for the patient even though she is nonambulatory at baseline.  I believe the fracture will help with pain control during routine patient care.  The patient's daughter was in agreement with this plan.  I also discussed the risks and benefits of surgery with the daughter.  She understands the risks include but are not limited to infection, bleeding requiring blood transfusion, nerve or blood vessel injury, joint stiffness or loss of motion, persistent pain, weakness or instability, fracture, dislocation, hardware failure and the need for further surgery. Medical risks include but are not limited to DVT and pulmonary embolism, myocardial infarction, stroke, pneumonia, respiratory failure and death. Patient's daughter understood these risks and wished to proceed.   I reviewed the laboratory and  radiographic studies in preparation for this case.  I have spoken with the hospitalist who states the patient is at moderate risk for surgery.  Patient is n.p.o.  Surgery is planned for this afternoon.  Preliminary start time is 1 PM.    Juanell Fairly, MD    02/21/2019 9:08 AM

## 2019-02-21 NOTE — Anesthesia Preprocedure Evaluation (Addendum)
Anesthesia Evaluation  Patient identified by MRN, date of birth, ID band Patient awake and Patient confused  General Assessment Comment:Hx reviewed with pt's daughter  Reviewed: Allergy & Precautions, NPO status , Patient's Chart, lab work & pertinent test results  History of Anesthesia Complications Negative for: history of anesthetic complications  Airway Mallampati: II  TM Distance: >3 FB Neck ROM: Full    Dental  (+) Edentulous Lower, Edentulous Upper   Pulmonary neg pulmonary ROS, neg sleep apnea, neg COPD,    breath sounds clear to auscultation- rhonchi (-) wheezing      Cardiovascular hypertension, Pt. on medications (-) CAD, (-) Past MI, (-) Cardiac Stents and (-) CABG  Rhythm:Regular Rate:Normal - Systolic murmurs and - Diastolic murmurs    Neuro/Psych neg Seizures PSYCHIATRIC DISORDERS Dementia negative neurological ROS     GI/Hepatic negative GI ROS, Neg liver ROS,   Endo/Other  negative endocrine ROSneg diabetes  Renal/GU negative Renal ROS     Musculoskeletal negative musculoskeletal ROS (+)   Abdominal (+) - obese,   Peds  Hematology negative hematology ROS (+)   Anesthesia Other Findings Past Medical History: No date: Dementia (HCC) No date: Hypertension   Reproductive/Obstetrics                            Lab Results  Component Value Date   WBC 10.7 (H) 02/21/2019   HGB 9.7 (L) 02/21/2019   HCT 31.1 (L) 02/21/2019   MCV 103.3 (H) 02/21/2019   PLT 196 02/21/2019    Anesthesia Physical Anesthesia Plan  ASA: III  Anesthesia Plan: Spinal   Post-op Pain Management:    Induction:   PONV Risk Score and Plan: 2 and Propofol infusion  Airway Management Planned: Natural Airway  Additional Equipment:   Intra-op Plan:   Post-operative Plan:   Informed Consent: I have reviewed the patients History and Physical, chart, labs and discussed the procedure including  the risks, benefits and alternatives for the proposed anesthesia with the patient or authorized representative who has indicated his/her understanding and acceptance.   Patient has DNR.  Discussed DNR with power of attorney and Suspend DNR.   Dental advisory given and Consent reviewed with POA  Plan Discussed with: CRNA and Anesthesiologist  Anesthesia Plan Comments:         Anesthesia Quick Evaluation

## 2019-02-21 NOTE — NC FL2 (Signed)
[place] MEDICAID FL2 LEVEL OF CARE SCREENING TOOL     IDENTIFICATION  Patient Name: Sabrina Burke Birthdate: 04-Sep-1927 Sex: female Admission Date (Current Location): 02/20/2019  [place] and [place] Number:  Chiropodist and Address:  [facility], [address] [place], [place] [address]      Provider Number: [id number]  Attending Physician Name and Address:  Thomasenia Bottoms, MD  Relative Name and Phone Number:  Calyse Murcia daughter [phone]    Current Level of Care: Hospital Recommended Level of Care: Skilled Nursing Facility Prior Approval Number:    Date Approved/Denied:   PASRR Number: [id number]  Discharge Plan: SNF    Current Diagnoses: Patient Active Problem List   Diagnosis Date Noted  . Closed left hip fracture (HCC) 02/20/2019  . Sepsis (HCC) 06/26/2018    Orientation RESPIRATION BLADDER Height & Weight     Self  Normal External catheter Weight: 100 lb (45.4 kg) Height:  5\' 2"  (157.5 cm)  BEHAVIORAL SYMPTOMS/MOOD NEUROLOGICAL BOWEL NUTRITION STATUS      Incontinent Diet(see DC summary-was on regular diet prior to admission)  AMBULATORY STATUS COMMUNICATION OF NEEDS Skin   Extensive Assist Verbally Surgical wounds                       Personal Care Assistance Level of Assistance  Bathing, Dressing Bathing Assistance: Maximum assistance   Dressing Assistance: Maximum assistance     Functional Limitations Info             SPECIAL CARE FACTORS FREQUENCY  PT (By licensed PT)     PT Frequency: 5 x week              Contractures Contractures Info: Not present    Additional Factors Info  Code Status, Allergies Code Status Info: DNR Allergies Info: No known allergies           Current Medications (02/21/2019):  This is the current hospital active medication list Current Facility-Administered Medications  Medication Dose Route Frequency Provider Last Rate Last Admin  . 0.9 %   sodium chloride infusion   Intravenous Continuous Mansy, Jan A, MD 100 mL/hr at 02/21/19 0105 New Bag at 02/21/19 0105  . acetaminophen (TYLENOL) tablet 650 mg  650 mg Oral Q6H PRN Mansy, Jan A, MD       Or  . acetaminophen (TYLENOL) suppository 650 mg  650 mg Rectal Q6H PRN Mansy, Jan A, MD      . alum & mag hydroxide-simeth (MAALOX/MYLANTA) 200-200-20 MG/5ML suspension 30 mL  30 mL Oral PRN Mansy, Jan A, MD      . amLODipine (NORVASC) tablet 10 mg  10 mg Oral Daily Mansy, Jan A, MD      . bisacodyl (DULCOLAX) EC tablet 5 mg  5 mg Oral Q48H PRN Mansy, Jan A, MD      . ceFAZolin (ANCEF) IVPB 2g/100 mL premix  2 g Intravenous 30 min Pre-Op Feb, MD      . cefTRIAXone (ROCEPHIN) 1 g in sodium chloride 0.9 % 100 mL IVPB  1 g Intravenous Q24H Mansy, Jan A, MD 200 mL/hr at 02/21/19 0332 1 g at 02/21/19 0332  . cholecalciferol (VITAMIN D3) tablet 2,000 Units  2,000 Units Oral Daily Mansy, Jan A, MD      . docusate sodium (COLACE) capsule 100 mg  100 mg Oral QHS Mansy, Jan A, MD      . feeding supplement (ENSURE ENLIVE) (ENSURE ENLIVE) liquid  237 mL  237 mL Oral BID BM Mansy, Jan A, MD      . labetalol (NORMODYNE) injection 20 mg  20 mg Intravenous Q3H PRN Mansy, Jan A, MD      . levothyroxine (SYNTHROID) tablet 88 mcg  88 mcg Oral QAC breakfast Mansy, Jan A, MD      . loperamide (IMODIUM) capsule 2 mg  2 mg Oral PRN Mansy, Jan A, MD      . LORazepam (ATIVAN) tablet 0.25 mg  0.25 mg Oral BID PRN Mansy, Jan A, MD      . LORazepam (ATIVAN) tablet 0.25 mg  0.25 mg Oral BID Mansy, Jan A, MD      . multivitamin with minerals tablet   Oral Daily Mansy, Jan A, MD      . ondansetron [facility]) tablet 4 mg  4 mg Oral Q6H PRN Mansy, Jan A, MD       Or  . ondansetron [facility]) injection 4 mg  4 mg Intravenous Q6H PRN Mansy, Jan A, MD      . oxyCODONE (Oxy IR/ROXICODONE) immediate release tablet 5 mg  5 mg Oral Q4H PRN Mansy, Jan A, MD      . polyethylene glycol (MIRALAX / GLYCOLAX) packet 17 g  17 g  Oral Daily PRN Mansy, Jan A, MD      . traZODone (DESYREL) tablet 25 mg  25 mg Oral QHS PRN Mansy, Arvella Merles, MD         Discharge Medications: Please see discharge summary for a list of discharge medications.  Relevant Imaging Results:  Relevant Lab Results:   Additional Information SSN: [id number]  Kaelynne Christley, [name], LCSW

## 2019-02-21 NOTE — Transfer of Care (Signed)
Immediate Anesthesia Transfer of Care Note  Patient: Sabrina Burke  Procedure(s) Performed: LEFT HIP HEMIARTHROPLASTY (Left Hip)  Patient Location: PACU  Anesthesia Type:Spinal  Level of Consciousness: awake, alert  and confused  Airway & Oxygen Therapy: Patient Spontanous Breathing  Post-op Assessment: Report given to RN and Post -op Vital signs reviewed and stable  Post vital signs: Reviewed and stable  Last Vitals:  Vitals Value Taken Time  BP 129/70 02/21/19 1500  Temp    Pulse 78 02/21/19 1504  Resp 21 02/21/19 1504  SpO2 95 % 02/21/19 1504  Vitals shown include unvalidated device data.  Last Pain:  Vitals:   02/21/19 1208  TempSrc: Temporal         Complications: No apparent anesthesia complications

## 2019-02-21 NOTE — Op Note (Signed)
02/21/2019  3:22 PM  PATIENT:  Sabrina Burke   MRN: [id number]  PRE-OPERATIVE DIAGNOSIS:  LEFT FEMORAL NECK FRACTURE  POST-OPERATIVE DIAGNOSIS:  LEFT FEMORAL NECK FRACTURE  PROCEDURE:  Procedure(s): LEFT HIP HEMIARTHROPLASTY  PREOPERATIVE INDICATIONS:    Sabrina Burke is an 86 y.o. female with advanced dementia who was admitted with a diagnosis of LEFT FEMORAL NECK FRACTURE.  I have recommended surgical fixation with hemiarthroplasty for this injury. I have explained the surgery and the postoperative course to the patient's daughter who have agreed with surgical management of this fracture.    The risks benefits and alternatives were discussed with the patient and their family including but not limited to the risks of  infection requiring removal of the prosthesis, bleeding requiring blood transfusion, nerve injury especially to the sciatic nerve leading to foot drop or lower extremity numbness, periprosthetic fracture, dislocation leg length discrepancy, change in lower extremity rotation persistent hip pain, loosening or failure of the components and the need for revision surgery. Medical risks include but are not limited to DVT and pulmonary embolism, myocardial infarction, stroke, pneumonia, respiratory failure and death.  OPERATIVE REPORT     SURGEON:  Thornton Park, MD    ASSISTANT: Surgical tech    ANESTHESIA: Spinal  EBL: [id number]    COMPLICATIONS:  None.   SPECIMEN: Femoral head to pathology    COMPONENTS:  [employer]ker Accolade 2 femoral component size 4 with a 45 mm +0 (standard) neck adjustment sleeve.    PROCEDURE IN DETAIL:   The patient was met in the holding area and  identified.  The appropriate hip was identified and marked at the operative site after verbally confirming with the patient that this was the correct site of surgery.  The patient was then transported to the OR  and  underwent spinal anesthesia.  The patient was then placed in the lateral decubitus  position with the operative side up and secured on the operating room table with a pegboard and all bony prominences were adequately padded. This included an axillary roll and additional padding around the nonoperative leg to prevent compression to the common peroneal nerve.    The operative lower extremity was prepped and draped in a sterile fashion.  A time out was performed prior to incision to verify patient's name, date of birth, medical record number, correct site of surgery correct procedure to be performed. The timeout was also used to verify the patient received antibiotics now appropriate instruments, implants and radiographic studies were available in the room. Once all in attendance were in agreement case began.  Patient received 2 g of Ancef prior to the onset of the case.    A posterolateral approach was utilized via sharp dissection  carried down to the subcutaneous tissue.  Bleeding vessels were coagulated using electrocautery.  The fascia lata was identified and incised along the length of the skin incision.  The gluteus maximus muscle was then split in line with its fibers. Self-retaining retractors were  inserted.  With the hip internally rotated, the short external rotators  were identified and removed from the posterior attachment from the greater trochanter. The piriformis was tagged for later repair. The capsule was identified and a T-shaped capsulotomy was performed. The capsule was tagged with #2 Tycron for later repair.  The femoral neck fracture was exposed, and the femoral head was removed using a corkscrew device. This was measured to be 45 mm in diameter. The attention was then turned to proximal femur preparation.  An  oscillating saw was used to perform a proximal femoral osteotomy 1 fingerbreadth above the lesser trochanter. The trial 45 mm femoral head was placed into the acetabulum and had an excellent suction fit. The attention was then turned back to femoral preparation.     A femoral skid and Cobra retractor were placed under the femoral neck to allow for adequate visualization. A box osteotome was used to make the initial entry into the proximal femur. A single hand reamer was used to prepare the femoral canal. A T-shaped femoral canal sounder was then used to ensure no penetration femoral cortex had occurred during reaming. The proximal femur was then sequentially broached by hand. A size 5 femoral trial broach was found to have best medial to lateral canal fit. Once adequate mediolateral canal fill was achieved the trial femoral broach, neck, and head was assembled and the hip was reduced. It was found to have excellent stability, equivalent leg lengths with functional range of motion. The trial components were then removed.  I copiously irrigated the femoral canal and then impacted the real femoral prosthesis into place into the appropriate version, slightly anteverted to the normal anatomy, and I impacted the actual 46 mm Unitrax femoral component with a +0 neck adjustment sleeve into place. The hip was then reduced and taken through functional range of motion and found to have excellent stability. Leg lengths were restored. The hip joint was copiously irrigated.   A soft tissue repair of the capsule and external rotators was performed using #2 Tycron Excellent posterior capsular repair was achieved. The fascia lata was then closed with interrupted 0 Vicryl suture. The subcutaneous tissues were closed with 2-0 Vicryl and the skin approximated with staples.   The patient was then placed supine on the operative table. Leg lengths were checked clinically and found to be equivalent. An abduction pillow was placed between the lower extremities. The patient was then transferred to a hospital bed and brought to the PACU in stable condition. I was scrubbed and present the entire case and all sharp and instrument counts were correct at the conclusion of the case. I spoke with the  patient's daughter by phone from the PACU to let her know the case was completed without complication patient was stable in recovery room.   Timoteo Gaul, MD Orthopedic Surgeon

## 2019-02-21 NOTE — H&P (Addendum)
[place] at [facility]   PATIENT NAME: Sabrina Burke    MR#:  [id number]  DATE OF BIRTH:  January 09, 1928  DATE OF ADMISSION:  02/20/2019  PRIMARY CARE PHYSICIAN: Devoria Glassing, NP   REQUESTING/REFERRING PHYSICIAN: Chaney Malling, MD  CHIEF COMPLAINT:   Chief Complaint  Patient presents with  . Fall    HISTORY OF PRESENT ILLNESS:  Sabrina Burke  is a 86 y.o. pleasantly demented Caucasian female with a known history of dementia and hypertension, who presented to the emergency room with acute onset of left hip pain after having a mechanical fall at [facility].  Apparently this happened several days ago and she complained of pain until today when she was moved.  She is nonverbal and therefore no history could be obtained from her.  She was sent to the ER to have x-ray of the left hip which showed left subcapital hip fracture.  Upon position to the emergency room, temperature was 99.8, respiratory to 32 and otherwise vital signs were within normal.  Blood pressure later on was elevated at 179/84 with heart rate of 108.  Labs revealed a BUN of 13 creatinine 2.05 compared to 32/1.31 on 10/24/2018.  CBC showed mild leukocytosis of 13.2 and anemia.  Lactic acid was 1.1.  Influenza a and B antigens came back negative.  COVID-19 PCR came back negative.  UA was positive for UTI. EKG showed sinus tachycardia with rate of 100 and PACs with right axis deviation and T wave inversion inferiorly.  The patient was given 650 mg of PR Tylenol as well as 2 g of IV Ancef and 4 mg of IV morphine sulfate.  Dr. Martha Clan was notified about the patient.  She will be admitted to a med-surge bed for further evaluation and management  PAST MEDICAL HISTORY:   Past Medical History:  Diagnosis Date  . Dementia (HCC)   . Hypertension     PAST SURGICAL HISTORY:  Unobtainable due to the patient's advanced dementia.  SOCIAL HISTORY:   Social History   Tobacco Use  .  Smoking status: Never Smoker  . Smokeless tobacco: Never Used  . Tobacco comment: pt with advance dementia  Substance Use Topics  . Alcohol use: Not Currently    FAMILY HISTORY:  Unobtainable due to the patient's advanced dementia.  DRUG ALLERGIES:  No Known Allergies  REVIEW OF SYSTEMS:   ROS As per history of present illness. All pertinent systems were reviewed above. Constitutional,  HEENT, cardiovascular, respiratory, GI, GU, musculoskeletal, neuro, psychiatric, endocrine,  integumentary and hematologic systems were reviewed and are otherwise  negative/unremarkable except for positive findings mentioned above in the HPI.   MEDICATIONS AT HOME:   Prior to Admission medications   Medication Sig Start Date End Date Taking? Authorizing Provider  acetaminophen (TYLENOL) 325 MG tablet Take 325 mg by mouth every 6 (six) hours as needed for fever.    Yes [provider]  alum & mag hydroxide-simeth (MAALOX/MYLANTA) 200-200-20 MG/5ML suspension Take 30 mLs by mouth as needed for indigestion or heartburn (max 4 doses in 24 hours).    Yes [provider]  bisacodyl (DULCOLAX) 5 MG EC tablet Take 5 mg by mouth every other day as needed for moderate constipation.    Yes [provider]  furosemide (LASIX) 20 MG tablet Take 20 mg by mouth daily.   Yes [provider]  guaiFENesin (ROBITUSSIN) 100 MG/5ML liquid Take 200 mg by mouth every 6 (six) hours as  needed for cough.    Yes [provider]  levothyroxine (SYNTHROID) 88 MCG tablet Take 88 mcg by mouth daily before breakfast.    Yes [provider]  loperamide (IMODIUM) 2 MG capsule Take 2 mg by mouth as needed for diarrhea or loose stools (max 8 doses in 24 hours).    Yes [provider]  magnesium hydroxide (MILK OF MAGNESIA) 400 MG/5ML suspension Take 30 mLs by mouth at bedtime as needed for mild constipation.    Yes [provider]  Multiple Vitamin (DAILY VITE PO)  Take 1 tablet by mouth daily.   Yes [provider]  neomycin-bacitracin-polymyxin (NEOSPORIN) ointment Apply 1 application topically as needed for wound care.   Yes [provider]  amLODipine (NORVASC) 10 MG tablet Take 1 tablet (10 mg total) by mouth daily. 06/28/18   Alford Highland, MD  aspirin EC 81 MG EC tablet Take 1 tablet (81 mg total) by mouth daily. 06/28/18   Alford Highland, MD  Cholecalciferol (VITAMIN D3) 50 MCG (2000 UT) TABS Take 2,000 Units by mouth daily.     [provider]  ciprofloxacin (CIPRO) 500 MG tablet Take 1 tablet (500 mg total) by mouth daily at 6 PM. 10/24/18   Gouru, Aruna, MD  docusate sodium (COLACE) 100 MG capsule Take 100 mg by mouth at bedtime.    [provider]  feeding supplement, ENSURE ENLIVE, (ENSURE ENLIVE) LIQD Take 237 mLs by mouth 2 (two) times daily between meals. 06/28/18   Alford Highland, MD  LORazepam (ATIVAN) 0.5 MG tablet Take 0.25 mg by mouth 2 (two) times daily as needed for anxiety.    [provider]  LORazepam (ATIVAN) 0.5 MG tablet Take 0.5 tablets (0.25 mg total) by mouth 2 (two) times daily. Takes at 1400 and 2100 10/24/18   Gouru, Deanna Artis, MD  polyethylene glycol (MIRALAX / GLYCOLAX) 17 g packet Take 17 g by mouth daily as needed for mild constipation. 10/24/18   Gouru, Deanna Artis, MD  potassium chloride (KLOR-CON) 20 MEQ packet Take 20 mEq by mouth daily. 08/24/18   Alford Highland, MD      VITAL SIGNS:  Blood pressure 120/60, pulse 76, temperature (!) 102 F (38.9 C), temperature source Rectal, resp. rate 14, height 5\' 2"  (1.575 m), weight 45.4 kg, SpO2 92 %.  PHYSICAL EXAMINATION:  Physical Exam  GENERAL:  86 y.o.-year-old patient lying in the bed with no acute distress.  EYES: Pupils equal, round, reactive to light and accommodation. No scleral icterus. Extraocular muscles intact.  HEENT: Head atraumatic, normocephalic. Oropharynx and nasopharynx clear.  NECK:  Supple, no jugular venous  distention. No thyroid enlargement, no tenderness.  LUNGS: Normal breath sounds bilaterally, no wheezing, rales,rhonchi or crepitation. No use of accessory muscles of respiration.  CARDIOVASCULAR: Regular rate and rhythm, S1, S2 normal. No murmurs, rubs, or gallops.  ABDOMEN: Soft, nondistended, nontender. Bowel sounds present. No organomegaly or mass.  EXTREMITIES: No pedal edema, cyanosis, or clubbing.  NEUROLOGIC: Cranial nerves II through XII are intact. Muscle strength 5/5 in all extremities. Sensation intact. Gait not checked.  PSYCHIATRIC: The patient is alert and oriented x 3.  Normal affect and good eye contact. SKIN: No obvious rash, lesion, or ulcer.   LABORATORY PANEL:   CBC Recent Labs  Lab 02/20/19 2053  WBC 13.2*  HGB 10.3*  HCT 32.0*  PLT 266   ------------------------------------------------------------------------------------------------------------------  Chemistries  Recent Labs  Lab 02/20/19 2053  NA 145  K 4.0  CL 109  CO2  26  GLUCOSE 154*  BUN 50*  CREATININE 2.01*  CALCIUM 8.9  AST 27  ALT 22  ALKPHOS 77  BILITOT 0.6   ------------------------------------------------------------------------------------------------------------------  Cardiac Enzymes No results for input(s): TROPONINI in the last 168 hours. ------------------------------------------------------------------------------------------------------------------  RADIOLOGY:  DG Chest 1 View  Result Date: 02/20/2019 CLINICAL DATA:  Initial evaluation for acute trauma, fall. EXAM: CHEST  1 VIEW COMPARISON:  Prior radiograph from 11/01/2018. FINDINGS: Mild cardiomegaly, stable. Mediastinal silhouette within normal limits. Aortic atherosclerosis. Chronic elevation of the right hemidiaphragm, stable from previous. Associated mild linear right basilar subsegmental atelectasis. No other focal airspace disease. No edema or effusion. No visible pneumothorax, although the patient's head partially  obscures the right lung apex. No acute osseous abnormality. Diffuse osteopenia. Mild gaseous distension of the transverse colon noted within the upper abdomen. IMPRESSION: 1. No active cardiopulmonary disease. 2. Chronic elevation of the right hemidiaphragm with associated minimal right basilar subsegmental atelectasis. 3.  Aortic Atherosclerosis (ICD10-I70.0). Electronically Signed   By: Rise Mu M.D.   On: 02/20/2019 20:26   CT Head Wo Contrast  Result Date: 02/20/2019 CLINICAL DATA:  Recent fall 2 days ago with pain, initial encounter EXAM: CT HEAD WITHOUT CONTRAST CT CERVICAL SPINE WITHOUT CONTRAST TECHNIQUE: Multidetector CT imaging of the head and cervical spine was performed following the standard protocol without intravenous contrast. Multiplanar CT image reconstructions of the cervical spine were also generated. COMPARISON:  12/20/2018 FINDINGS: CT HEAD FINDINGS Brain: Chronic atrophic and ischemic changes are again seen. No findings to suggest acute hemorrhage, acute infarction or space-occupying mass lesion are noted. Vascular: No hyperdense vessel or unexpected calcification. Skull: Normal. Negative for fracture or focal lesion. Sinuses/Orbits: No acute finding. Other: None. CT CERVICAL SPINE FINDINGS Alignment: Within normal limits. Skull base and vertebrae: 7 cervical segments are well visualized. Vertebral body height is well maintained. Multilevel facet hypertrophic changes are noted. No acute fracture or acute facet abnormality is noted. Mild osteophytic changes are seen. Disc space narrowing is also seen primarily at C5-6 and C6-7. Soft tissues and spinal canal: Surrounding soft tissue structures are within normal limits. Carotid calcifications are seen. Upper chest: Visualized lung apices are within normal limits. Other: None IMPRESSION: CT of the head: Chronic atrophic and ischemic changes without acute abnormality. CT of cervical spine: Multilevel degenerative change without  acute abnormality. Electronically Signed   By: Alcide Clever M.D.   On: 02/20/2019 20:37   CT Cervical Spine Wo Contrast  Result Date: 02/20/2019 CLINICAL DATA:  Recent fall 2 days ago with pain, initial encounter EXAM: CT HEAD WITHOUT CONTRAST CT CERVICAL SPINE WITHOUT CONTRAST TECHNIQUE: Multidetector CT imaging of the head and cervical spine was performed following the standard protocol without intravenous contrast. Multiplanar CT image reconstructions of the cervical spine were also generated. COMPARISON:  12/20/2018 FINDINGS: CT HEAD FINDINGS Brain: Chronic atrophic and ischemic changes are again seen. No findings to suggest acute hemorrhage, acute infarction or space-occupying mass lesion are noted. Vascular: No hyperdense vessel or unexpected calcification. Skull: Normal. Negative for fracture or focal lesion. Sinuses/Orbits: No acute finding. Other: None. CT CERVICAL SPINE FINDINGS Alignment: Within normal limits. Skull base and vertebrae: 7 cervical segments are well visualized. Vertebral body height is well maintained. Multilevel facet hypertrophic changes are noted. No acute fracture or acute facet abnormality is noted. Mild osteophytic changes are seen. Disc space narrowing is also seen primarily at C5-6 and C6-7. Soft tissues and spinal canal: Surrounding soft tissue structures are within normal limits. Carotid calcifications are seen.  Upper chest: Visualized lung apices are within normal limits. Other: None IMPRESSION: CT of the head: Chronic atrophic and ischemic changes without acute abnormality. CT of cervical spine: Multilevel degenerative change without acute abnormality. Electronically Signed   By: Inez Catalina M.D.   On: 02/20/2019 20:37   DG Hip Unilat W or Wo Pelvis 2-3 Views Left  Result Date: 02/20/2019 CLINICAL DATA:  Recent fall with hip pain, initial encounter EXAM: DG HIP (WITH OR WITHOUT PELVIS) 3V LEFT COMPARISON:  None. FINDINGS: Pelvic ring is intact. There is a subcapital  femoral neck fracture with impaction and angulation at the fracture site. Postsurgical changes in the proximal right femur are noted. Degenerative changes of lumbar spine are seen. IMPRESSION: Left subcapital femoral neck fracture Electronically Signed   By: Inez Catalina M.D.   On: 02/20/2019 20:24      IMPRESSION AND PLAN:   1.  Left hip fracture secondary to mechanical fall.  The patient will be admitted to a MedSurg bed.  She will be n.p.o. after midnight.  Pain management will be provided.  Orthopedic consultation will be obtained.  Dr. Mack Guise is aware about the patient.  2.  UTI with subsequent sepsis.  This manifested by leukocytosis tachycardia and fever.  She will be placed on IV Rocephin and will follow urine culture and sensitivity as well as blood cultures.  3.  Hypertensive urgency.  The patient will be placed on as needed IV labetalol and will continue amlodipine.  4.  Acute kidney injury superimposed on stage IIIb chronic kidney disease.  She will be hydrated with IV normal saline and will follow her BMP.  5.  Hypothyroidism.  We will check TSH and continue Synthroid.  6.  DVT prophylaxis.  SCDs for now.  Medical prophylaxis will be held off for postoperative period.   All the records are reviewed and case discussed with ED provider. The plan of care was discussed in details with the patient (and family). I answered all questions. The patient agreed to proceed with the above mentioned plan. Further management will depend upon hospital course.   CODE STATUS: The patient is DNR/DNI.  TOTAL TIME TAKING CARE OF THIS PATIENT: 55 minutes.    Christel Mormon M.D on 02/21/2019 at 12:11 AM  [facility]   From 7 PM-7 AM, contact night-coverage [email or web address]  CC: Primary care physician; Odessa Fleming, NP   Note: This dictation was prepared with Dragon dictation along with smaller phrase technology. Any transcriptional errors that result from this process are  unintentional.

## 2019-02-21 NOTE — Progress Notes (Signed)
Anticoagulation monitoring(Lovenox):  86 yo female ordered Lovenox 40 mg Q24h  Filed Weights   02/20/19 1951  Weight: 100 lb (45.4 kg)   BMI    Lab Results  Component Value Date   CREATININE 1.75 (H) 02/21/2019   CREATININE 2.01 (H) 02/20/2019   CREATININE 1.31 (H) 10/24/2018   Estimated Creatinine Clearance: 15.6 mL/min (A) (by C-G formula based on SCr of 1.75 mg/dL (H)). Hemoglobin & Hematocrit     Component Value Date/Time   HGB 9.7 (L) 02/21/2019 0442   HCT 31.1 (L) 02/21/2019 0442     Per Protocol for Patient with estCrcl < 30 ml/min and BMI < 40, will transition to Lovenox 30 mg Q24h.

## 2019-02-22 ENCOUNTER — Inpatient Hospital Stay: Payer: Medicare Other

## 2019-02-22 LAB — BASIC METABOLIC PANEL
Anion gap: 9 (ref 5–15)
BUN: 36 mg/dL — ABNORMAL HIGH (ref 8–23)
CO2: 24 mmol/L (ref 22–32)
Calcium: 8.2 mg/dL — ABNORMAL LOW (ref 8.9–10.3)
Chloride: 114 mmol/L — ABNORMAL HIGH (ref 98–111)
Creatinine, Ser: 1.63 mg/dL — ABNORMAL HIGH (ref 0.44–1.00)
GFR calc Af Amer: 32 mL/min — ABNORMAL LOW (ref >60)
GFR calc non Af Amer: 28 mL/min — ABNORMAL LOW (ref >60)
Glucose, Bld: 184 mg/dL — ABNORMAL HIGH (ref 70–99)
Potassium: 3.5 mmol/L (ref 3.5–5.1)
Sodium: 147 mmol/L — ABNORMAL HIGH (ref 135–145)

## 2019-02-22 LAB — CBC
HCT: 26.9 % — ABNORMAL LOW (ref 36.0–46.0)
Hemoglobin: 8.6 g/dL — ABNORMAL LOW (ref 12.0–15.0)
MCH: 33 pg (ref 26.0–34.0)
MCHC: 32 g/dL (ref 30.0–36.0)
MCV: 103.1 fL — ABNORMAL HIGH (ref 80.0–100.0)
Platelets: 205 10*3/uL (ref 150–400)
RBC: 2.61 MIL/uL — ABNORMAL LOW (ref 3.87–5.11)
RDW: 12.3 % (ref 11.5–15.5)
WBC: 8.9 10*3/uL (ref 4.0–10.5)
nRBC: 0 % (ref 0.0–0.2)

## 2019-02-22 LAB — URINE CULTURE: Culture: >=100000 — AB

## 2019-02-22 MED ORDER — PIPERACILLIN-TAZOBACTAM 3.375 G IVPB
3.3750 g | Freq: Two times a day (BID) | INTRAVENOUS | Status: AC
  Administered 2019-02-22 – 2019-02-25 (×7): 3.375 g via INTRAVENOUS
  Filled 2019-02-22 (×7): qty 50

## 2019-02-22 MED ORDER — ACETAMINOPHEN 650 MG RE SUPP
650.0000 mg | Freq: Four times a day (QID) | RECTAL | Status: DC | PRN
  Administered 2019-02-22: 18:00:00 650 mg via RECTAL
  Filled 2019-02-22: qty 1

## 2019-02-22 MED ORDER — CHLORHEXIDINE GLUCONATE CLOTH 2 % EX PADS
6.0000 | MEDICATED_PAD | Freq: Every day | CUTANEOUS | Status: DC
  Administered 2019-02-22 – 2019-03-03 (×5): 6 via TOPICAL

## 2019-02-22 MED ORDER — ACETAMINOPHEN 650 MG RE SUPP
325.0000 mg | Freq: Three times a day (TID) | RECTAL | Status: DC | PRN
  Administered 2019-02-22: 12:00:00 325 mg via RECTAL
  Filled 2019-02-22: qty 1

## 2019-02-22 MED ORDER — METOPROLOL TARTRATE 5 MG/5ML IV SOLN
2.5000 mg | Freq: Two times a day (BID) | INTRAVENOUS | Status: DC
  Administered 2019-02-22 – 2019-02-23 (×2): 2.5 mg via INTRAVENOUS
  Filled 2019-02-22 (×3): qty 5

## 2019-02-22 MED ORDER — VANCOMYCIN VARIABLE DOSE PER UNSTABLE RENAL FUNCTION (PHARMACIST DOSING): Status: DC

## 2019-02-22 MED ORDER — VANCOMYCIN HCL IN DEXTROSE 1-5 GM/200ML-% IV SOLN
1000.0000 mg | Freq: Once | INTRAVENOUS | Status: AC
  Administered 2019-02-22: 18:00:00 1000 mg via INTRAVENOUS
  Filled 2019-02-22: qty 200

## 2019-02-22 MED ORDER — LEVOTHYROXINE SODIUM 100 MCG/5ML IV SOLN
88.0000 ug | Freq: Every day | INTRAVENOUS | Status: DC
  Administered 2019-02-23 – 2019-02-24 (×2): 88 ug via INTRAVENOUS
  Filled 2019-02-22 (×2): qty 5

## 2019-02-22 NOTE — Progress Notes (Signed)
OT Cancellation Note  Patient Details Name: Sabrina Burke MRN: [id number] DOB: 12-Apr-1927   Cancelled Treatment:    Reason Eval/Treat Not Completed: Fatigue/lethargy limiting ability to participate;Medical issues which prohibited therapy, Thank you for the OT consult. Order received and chart reviewed. Per conversation with physical therapist, pt unable to participate in evaluation at this time. Can state name only, and does not follow VCs. Pt also noted to have 100.7 fever at this time. Will hold OT evaluation and initiate services as available and pt medically appropriate for OT evaluation.   Rockney Ghee, M.S., OTR/L Ascom: [phone] 02/22/19, 9:51 AM

## 2019-02-22 NOTE — Progress Notes (Signed)
Subjective:  POD #1 s/p left hip hemiarthroplasty.   Patient has dementia and appears lethargic.  Speech therapist at the bedside.  Patient having trouble swallowing.  She will be made n.p.o.   Patient having fevers upto 101.9.  Objective:   VITALS:   Vitals:   02/22/19 0941 02/22/19 1043 02/22/19 1124 02/22/19 1140  BP:    (!) 115/57  Pulse:    76  Resp:    16  Temp: (!) 100.7 F (38.2 C) (!) 101.8 F (38.8 C) (!) 101.9 F (38.8 C) 100.2 F (37.9 C)  TempSrc: Axillary Axillary Rectal Axillary  SpO2:    95%  Weight:      Height:        PHYSICAL EXAM: Left lower extremity Neurovascular intact Sensation intact distally Intact pulses distally Dorsiflexion/Plantar flexion intact Incision: dressing C/D/I No cellulitis present Compartment soft  LABS  Results for orders placed or performed during the hospital encounter of 02/20/19 (from the past 24 hour(s))  CBC     Status: Abnormal   Collection Time: 02/22/19  4:19 AM  Result Value Ref Range   WBC 8.9 4.0 - 10.5 K/uL   RBC 2.61 (L) 3.87 - 5.11 MIL/uL   Hemoglobin 8.6 (L) 12.0 - 15.0 g/dL   HCT 26.9 (L) 36.0 - 46.0 %   MCV 103.1 (H) 80.0 - 100.0 fL   MCH 33.0 26.0 - 34.0 pg   MCHC 32.0 30.0 - 36.0 g/dL   RDW 12.3 11.5 - 15.5 %   Platelets 205 150 - 400 K/uL   nRBC 0.0 0.0 - 0.2 %  Basic metabolic panel     Status: Abnormal   Collection Time: 02/22/19  4:19 AM  Result Value Ref Range   Sodium 147 (H) 135 - 145 mmol/L   Potassium 3.5 3.5 - 5.1 mmol/L   Chloride 114 (H) 98 - 111 mmol/L   CO2 24 22 - 32 mmol/L   Glucose, Bld 184 (H) 70 - 99 mg/dL   BUN 36 (H) 8 - 23 mg/dL   Creatinine, Ser 1.63 (H) 0.44 - 1.00 mg/dL   Calcium 8.2 (L) 8.9 - 10.3 mg/dL   GFR calc non Af Amer 28 (L) >60 mL/min   GFR calc Af Amer 32 (L) >60 mL/min   Anion gap 9 5 - 15    DG Chest 1 View  Result Date: 02/20/2019 CLINICAL DATA:  Initial evaluation for acute trauma, fall. EXAM: CHEST  1 VIEW COMPARISON:  Prior radiograph from  11/01/2018. FINDINGS: Mild cardiomegaly, stable. Mediastinal silhouette within normal limits. Aortic atherosclerosis. Chronic elevation of the right hemidiaphragm, stable from previous. Associated mild linear right basilar subsegmental atelectasis. No other focal airspace disease. No edema or effusion. No visible pneumothorax, although the patient's head partially obscures the right lung apex. No acute osseous abnormality. Diffuse osteopenia. Mild gaseous distension of the transverse colon noted within the upper abdomen. IMPRESSION: 1. No active cardiopulmonary disease. 2. Chronic elevation of the right hemidiaphragm with associated minimal right basilar subsegmental atelectasis. 3.  Aortic Atherosclerosis (ICD10-I70.0). Electronically Signed   By: Jeannine Boga M.D.   On: 02/20/2019 20:26   CT Head Wo Contrast  Result Date: 02/20/2019 CLINICAL DATA:  Recent fall 2 days ago with pain, initial encounter EXAM: CT HEAD WITHOUT CONTRAST CT CERVICAL SPINE WITHOUT CONTRAST TECHNIQUE: Multidetector CT imaging of the head and cervical spine was performed following the standard protocol without intravenous contrast. Multiplanar CT image reconstructions of the cervical spine were also generated. COMPARISON:  12/20/2018 FINDINGS: CT HEAD FINDINGS Brain: Chronic atrophic and ischemic changes are again seen. No findings to suggest acute hemorrhage, acute infarction or space-occupying mass lesion are noted. Vascular: No hyperdense vessel or unexpected calcification. Skull: Normal. Negative for fracture or focal lesion. Sinuses/Orbits: No acute finding. Other: None. CT CERVICAL SPINE FINDINGS Alignment: Within normal limits. Skull base and vertebrae: 7 cervical segments are well visualized. Vertebral body height is well maintained. Multilevel facet hypertrophic changes are noted. No acute fracture or acute facet abnormality is noted. Mild osteophytic changes are seen. Disc space narrowing is also seen primarily at C5-6  and C6-7. Soft tissues and spinal canal: Surrounding soft tissue structures are within normal limits. Carotid calcifications are seen. Upper chest: Visualized lung apices are within normal limits. Other: None IMPRESSION: CT of the head: Chronic atrophic and ischemic changes without acute abnormality. CT of cervical spine: Multilevel degenerative change without acute abnormality. Electronically Signed   By: Alcide Clever M.D.   On: 02/20/2019 20:37   CT Cervical Spine Wo Contrast  Result Date: 02/20/2019 CLINICAL DATA:  Recent fall 2 days ago with pain, initial encounter EXAM: CT HEAD WITHOUT CONTRAST CT CERVICAL SPINE WITHOUT CONTRAST TECHNIQUE: Multidetector CT imaging of the head and cervical spine was performed following the standard protocol without intravenous contrast. Multiplanar CT image reconstructions of the cervical spine were also generated. COMPARISON:  12/20/2018 FINDINGS: CT HEAD FINDINGS Brain: Chronic atrophic and ischemic changes are again seen. No findings to suggest acute hemorrhage, acute infarction or space-occupying mass lesion are noted. Vascular: No hyperdense vessel or unexpected calcification. Skull: Normal. Negative for fracture or focal lesion. Sinuses/Orbits: No acute finding. Other: None. CT CERVICAL SPINE FINDINGS Alignment: Within normal limits. Skull base and vertebrae: 7 cervical segments are well visualized. Vertebral body height is well maintained. Multilevel facet hypertrophic changes are noted. No acute fracture or acute facet abnormality is noted. Mild osteophytic changes are seen. Disc space narrowing is also seen primarily at C5-6 and C6-7. Soft tissues and spinal canal: Surrounding soft tissue structures are within normal limits. Carotid calcifications are seen. Upper chest: Visualized lung apices are within normal limits. Other: None IMPRESSION: CT of the head: Chronic atrophic and ischemic changes without acute abnormality. CT of cervical spine: Multilevel degenerative  change without acute abnormality. Electronically Signed   By: Alcide Clever M.D.   On: 02/20/2019 20:37   DG Hip Port Unilat With Pelvis 1V Left  Result Date: 02/21/2019 CLINICAL DATA:  Left hip hemi arthroplasty for fracture EXAM: DG HIP (WITH OR WITHOUT PELVIS) 1V PORT LEFT COMPARISON:  02/19/2018 FINDINGS: Left hip hemiarthroplasty in satisfactory position alignment. Chronic right fracture fixation with screw and rod. No new fracture in the pelvis IMPRESSION: Satisfactory left hip hemiarthroplasty for fracture. Electronically Signed   By: Marlan Palau M.D.   On: 02/21/2019 15:45   DG Hip Unilat W or Wo Pelvis 2-3 Views Left  Result Date: 02/20/2019 CLINICAL DATA:  Recent fall with hip pain, initial encounter EXAM: DG HIP (WITH OR WITHOUT PELVIS) 3V LEFT COMPARISON:  None. FINDINGS: Pelvic ring is intact. There is a subcapital femoral neck fracture with impaction and angulation at the fracture site. Postsurgical changes in the proximal right femur are noted. Degenerative changes of lumbar spine are seen. IMPRESSION: Left subcapital femoral neck fracture Electronically Signed   By: Alcide Clever M.D.   On: 02/20/2019 20:24    Assessment/Plan: 1 Day Post-Op   Active Problems:   Closed left hip fracture (HCC)   Fever  Patient with fevers post-op.   She is having difficulty swallowing.  I am ordering a portable chest x-ray to valuate for possible aspiration pneumonia given her fevers.  Patient on ceftriaxone for UTI.  Continue Lovenox for DVT prophylaxis.  Patient is weightbearing as tolerated on the left lower extremity posterior hip precautions.  Further fever work-up per hospitalist service.    Juanell Fairly , MD 02/22/2019, 1:07 PM

## 2019-02-22 NOTE — Consult Note (Addendum)
Pharmacy Antibiotic Note  Sabrina Burke is a 86 y.o. female admitted on 02/20/2019 with possible bacteremia. Patient is s/p left hip hemiarthroplasty on 02/21/19. Patient previously with UTI and now c/f aspiration pneumonia or possible bacteremia.  Pharmacy has been consulted for vancomycin and pip/tazo dosing.  WBC trending down. Scr trending down. Patient has been persistently febrile today. Blood cultures sent.   Plan: Pip/tazo 3.375 g q12h extended infusion (renally adjusted)  Vancomycin 1 g LD x 1. Based on AKI this admission will dose per levels.   Daily Scr per protocol. Vancomycin trough ordered for 2/4 @ 0500.   Height: 5\' 2"  (157.5 cm) Weight: 100 lb (45.4 kg) IBW/kg (Calculated) : 50.1  Temp (24hrs), Avg:100.1 F (37.8 C), Min:97.8 F (36.6 C), Max:101.9 F (38.8 C)  Recent Labs  Lab 02/20/19 2053 02/21/19 0442 02/22/19 0419  WBC 13.2* 10.7* 8.9  CREATININE 2.01* 1.75* 1.63*  LATICACIDVEN 1.1  --   --     Estimated Creatinine Clearance: 16.8 mL/min (A) (by C-G formula based on SCr of 1.63 mg/dL (H)).    No Known Allergies  Antimicrobials this admission: Cefazolin 2/1 >> 2/2  CTX 2/1 >>  Vancomycin 2/2 >>   Dose adjustments this admission: n/a  Microbiology results: 1/31 SARS-CoV-2 + Influenza A/B: negative 1/31 BCx: NG x 2 days 1/31 UCx: >= 100k colonies/mL E coli (pan-sensitive)  2/1 MRSA PCR: negative 2/2 BCx: pending  Thank you for allowing pharmacy to be a part of this patient's care.  2/31  Pharmacy Resident 02/22/2019 6:19 PM

## 2019-02-22 NOTE — Evaluation (Signed)
Physical Therapy Evaluation Patient Details Name: Sabrina Burke MRN: [id number] DOB: 1927-10-04 Today's Date: 02/22/2019   History of Present Illness  Pt admitted for L hip fracture s/p falling out of bed at ALF. Pt is now s/p L hip hemiarthroplasty, post approach- POD 1. Has history of dementia and HTN.   Clinical Impression  Pt is a pleasant 86 year old female who was admitted for L hip fracture s/p L hip hemiarthroplasty. Pt not able to perform bed mobility this date secondary to safety concerns. Unable to adhere to post hip precautions, wedge in place. Attempted supine there-ex, however unable to actively participate. Pt demonstrates deficits with cognition/mobility. Takes +2 at baseline to transfer from bed->chair. Would benefit from skilled PT to address above deficits and promote optimal return to PLOF. Recommend to return to [facility] at this time with HHPT as she will be in familiar environment.    Follow Up Recommendations (return back to [facility] with 24/7 care and HHPT)    Equipment Recommendations  None recommended by PT    Recommendations for Other Services       Precautions / Restrictions Precautions Precautions: Fall;Posterior Hip Precaution Booklet Issued: No Restrictions Weight Bearing Restrictions: Yes LLE Weight Bearing: Weight bearing as tolerated      Mobility  Bed Mobility               General bed mobility comments: not able to be performed secondary to safety  Transfers                    Ambulation/Gait                Stairs            Wheelchair Mobility    Modified Rankin (Stroke Patients Only)       Balance Overall balance assessment: History of Falls                                           Pertinent Vitals/Pain Pain Assessment: No/denies pain    Home Living Family/patient expects to be discharged to:: Assisted living                 Additional Comments: per daughter,  pt is +2 total transfer from bed->WC at baseline. DOesn't use AD    Prior Function Level of Independence: Needs assistance         Comments: per daughter, pt has been participating in HHPT for upper body strengthing, however recently has required +2 for transfers in/out of WC. Pt unable to assist. Has been nonambulatory for over 2 years.      Hand Dominance        Extremity/Trunk Assessment   Upper Extremity Assessment Upper Extremity Assessment: Difficult to assess due to impaired cognition(will not participate by squeezing hand)    Lower Extremity Assessment Lower Extremity Assessment: Difficult to assess due to impaired cognition(unable to move feet/legs on command)       Communication      Cognition Arousal/Alertness: Lethargic Behavior During Therapy: Flat affect Overall Cognitive Status: History of cognitive impairments - at baseline                                        General Comments  Exercises Other Exercises Other Exercises: supine ther-ex performed on B LE including attempted AP, hip abd/add, and quad sets. Unable to perform on command and only responds to passive movement. Reports no pain with movement of either extremity. No active participation   Assessment/Plan    PT Assessment Patient needs continued PT services  PT Problem List Decreased strength;Decreased activity tolerance;Decreased balance;Decreased mobility;Decreased safety awareness;Decreased cognition       PT Treatment Interventions DME instruction;Therapeutic activities;Therapeutic exercise;Balance training;Wheelchair mobility training;Cognitive remediation;Patient/family education    PT Goals (Current goals can be found in the Care Plan section)  Acute Rehab PT Goals Patient Stated Goal: unable to state PT Goal Formulation: Patient unable to participate in goal setting Time For Goal Achievement: 03/08/19 Potential to Achieve Goals: Fair    Frequency 7X/week    Barriers to discharge        Co-evaluation               AM-PAC PT "6 Clicks" Mobility  Outcome Measure Help needed turning from your back to your side while in a flat bed without using bedrails?: Total Help needed moving from lying on your back to sitting on the side of a flat bed without using bedrails?: Total Help needed moving to and from a bed to a chair (including a wheelchair)?: Total Help needed standing up from a chair using your arms (e.g., wheelchair or bedside chair)?: Total Help needed to walk in hospital room?: Total Help needed climbing 3-5 steps with a railing? : Total 6 Click Score: 6    End of Session   Activity Tolerance: Patient limited by lethargy Patient left: in bed;with bed alarm set;with SCD's reapplied Nurse Communication: Mobility status PT Visit Diagnosis: Muscle weakness (generalized) (M62.81);History of falling (Z91.81)    Time: 2458-0998 PT Time Calculation (min) (ACUTE ONLY): 18 min   Charges:   PT Evaluation $PT Eval High Complexity: 1 High          Greggory Stallion, PT, DPT [phone]   Darrall Strey 02/22/2019, 3:34 PM

## 2019-02-22 NOTE — Evaluation (Signed)
Clinical/Bedside Swallow Evaluation Patient Details  Name: Sabrina Burke MRN: [id number] Date of Birth: 1927/07/16  Today's Date: 02/22/2019 Time: SLP Start Time (ACUTE ONLY): 1210 SLP Stop Time (ACUTE ONLY): 6759 SLP Time Calculation (min) (ACUTE ONLY): 55 min  Past Medical History:  Past Medical History:  Diagnosis Date  . Dementia ([place])   . Hypertension    Past Surgical History:  Past Surgical History:  Procedure Laterality Date  . HIP ARTHROPLASTY Left 02/21/2019   Procedure: LEFT HIP HEMIARTHROPLASTY;  Surgeon: Thornton Park, MD;  Location: [facility] ORS;  Service: Orthopedics;  Laterality: Left;   HPI:  Pt is a 86 y.o. female with a known history of Dementia and hypertension, who presented to the emergency room with acute onset of left hip pain after having a mechanical fall at [facility].  Apparently this happened several days ago and she complained of pain until today when she was moved.  She is nonverbal and therefore no history could be obtained from her.  She was sent to the ER to have x-ray of the left hip which showed left subcapital hip fracture.  Pt is POD #1 s/p left hip hemiarthroplasty Orthopedic noted.    Assessment / Plan / Recommendation Clinical Impression  This was a limited evaluation d/t pt's reduced oropharyngeal awareness for swallowing and reduced engagement in the tasks of po trials. Pt has Baseline Dementia and recent Acute injury/hip fx repair. Pt presents w/ further declined Cognitive awareness for tasks of oral intake thus is at increased risk for choking and aspiration. Pt required full assistance to sit midline w/ head forward. Pt has min Kyphotic head position w/ eyes looking at ceiling. Oral care given having to remove bolus residue from suspected Grits from breakfast meal this morning. Pt could not follow through w/ oral motor exam but no unilateral weakness noted; generalized weakness w/ open mouth posture at rest. Pt often bit  on the sponge swabs during oral care. Pt was presented w/ 1/2 tsp trials of Honey consistency liquids and purees, single ice chip prior, w/ little to no oral attention given to boluses for manipulation for A-P transfer. No pharyngeal swallows were appreciated during trials. As oral holding continued, puree trial residue was removed from mouth via swab. Pt was given tactile/verbal encouragement for swallowing during the po trials. Due to pt's presentation and risk for aspiration/choking, no further trials were attempted. Suspect pt's Baseline Dementia impacted by Acute illness may be interfering w/ pt's ability for oral awareness and task of swallowing as well as her overall engagement.  Recommend NPO status at this time w/ ongoing assessment/trials to hopefully upgrade pt to an oral diet when appropriate, safe. Recommend oral care for hygiene and stimulation of swallowing.  MD and NSG updated.  SLP Visit Diagnosis: Dysphagia, oropharyngeal phase (R13.12)(baseline Dementia)    Aspiration Risk  Moderate aspiration risk;Severe aspiration risk;Risk for inadequate nutrition/hydration    Diet Recommendation  NPO including meds; frequent oral care for hygiene and stimulation of swallowing; aspiration precautions  Medication Administration: Via alternative means    Other  Recommendations Recommended Consults: (Palliative Care consult for [place]; Dietician f/u) Oral Care Recommendations: Oral care QID;Staff/trained caregiver to provide oral care Other Recommendations: (TBD)   Follow up Recommendations Skilled Nursing facility(TBD)      Frequency and Duration min 3x week  2 weeks       Prognosis Prognosis for Safe Diet Advancement: Guarded Barriers to Reach Goals: Cognitive deficits;Time post onset;Severity of deficits  Swallow Study   General Date of Onset: 02/20/19 HPI: Pt is a 86 y.o. female with a known history of Dementia and hypertension, who presented to the emergency room with acute onset  of left hip pain after having a mechanical fall at [facility].  Apparently this happened several days ago and she complained of pain until today when she was moved.  She is nonverbal and therefore no history could be obtained from her.  She was sent to the ER to have x-ray of the left hip which showed left subcapital hip fracture.  Pt is POD #1 s/p left hip hemiarthroplasty Orthopedic noted.  Type of Study: Bedside Swallow Evaluation Previous Swallow Assessment: none noted Diet Prior to this Study: Regular;Thin liquids Temperature Spikes Noted: Yes(recent 101.9; wbc 8.9) Respiratory Status: Nasal cannula(1L) History of Recent Intubation: No Behavior/Cognition: Cooperative;Pleasant mood;Confused;Distractible;Requires cueing;Doesn't follow directions(Awake; mostly nonverbal) Oral Cavity Assessment: Dry(bolus residue - appeared to be grits(removed)) Oral Care Completed by SLP: Yes Oral Cavity - Dentition: Edentulous Vision: (n/a) Self-Feeding Abilities: Total assist Patient Positioning: Upright in bed(needed full positioning) Baseline Vocal Quality: (mumbled x3) Volitional Cough: Cognitively unable to elicit Volitional Swallow: Unable to elicit    Oral/Motor/Sensory Function Overall Oral Motor/Sensory Function: Generalized oral weakness(could not fully exam d/t Cognitive status) Facial Symmetry: Within Functional Limits Lingual Symmetry: Within Functional Limits(w/ open mouth)   Ice Chips Ice chips: Impaired Presentation: Spoon(fed; 2 trials) Oral Phase Impairments: Poor awareness of bolus;Reduced lingual movement/coordination;Reduced labial seal Oral Phase Functional Implications: Oral holding Pharyngeal Phase Impairments: (no pharyngeal swallow appreciated)   Thin Liquid Thin Liquid: Not tested    Nectar Thick Nectar Thick Liquid: Not tested   Honey Thick Honey Thick Liquid: Impaired Presentation: Spoon(fed; 1 trial) Oral Phase Impairments: Poor awareness of  bolus;Reduced lingual movement/coordination;Reduced labial seal Oral Phase Functional Implications: Oral holding Pharyngeal Phase Impairments: (no pharyngeal swallow appreciated)   Puree Puree: Impaired Presentation: Spoon(fed; 1 trial) Oral Phase Impairments: Reduced labial seal;Reduced lingual movement/coordination;Poor awareness of bolus Oral Phase Functional Implications: Oral holding Pharyngeal Phase Impairments: (no pharyngeal swallow appreciated) Other Comments: bolus residue removed via swab   Solid     Solid: Not tested       Jerilynn Som, MS, CCC-SLP Hollyanne Schloesser 02/22/2019,3:21 PM

## 2019-02-22 NOTE — Progress Notes (Addendum)
Subjective: S/P left hip hemiarthroplasty on 02/21/19; POD 1. Recovering. Denies much pain.  Appears very slow and lethargic. Have been having fever.   Objective: Vital signs in last 24 hours: Temp:  [97.8 F (36.6 C)-101.9 F (38.8 C)] 100.2 F (37.9 C) (02/02 1140) Pulse Rate:  [73-81] 76 (02/02 1140) Resp:  [9-23] 16 (02/02 1140) BP: (115-168)/(46-90) 115/57 (02/02 1140) SpO2:  [87 %-100 %] 95 % (02/02 1140)  Intake/Output from previous day: 02/01 0701 - 02/02 0700 In: 2474.2 [I.V.:2274.2] Out: 925 [Urine:825] Intake/Output this shift: No intake/output data recorded.  GENERAL:  lying in the bed with no acute distress.  EYES: No scleral icterus. Extraocular muscles intact.  HEENT: Head atraumatic, normocephalic. Oropharynx and nasopharynx clear.  NECK:  Supple, no jugular venous distention. No thyroid enlargement, no tenderness.  LUNGS: Normal breath sounds bilaterally, no wheezing, rales,rhonchi or crepitation. No use of accessory muscles of respiration.  CARDIOVASCULAR: Tachycardic; regular rhythm, S1, S2 normal. No murmurs, rubs, or gallops.  ABDOMEN: Soft, nondistended, nontender. Bowel sounds present. No organomegaly or mass.  EXTREMITIES: Left hip/thigh surgical wraps; No pedal edema, cyanosis, or clubbing.  NEUROLOGIC:  Muscle strength 5/5 in all extremities except left hip, leg. Sensation intact. Gait not checked.  PSYCHIATRIC: The patient is alert and oriented x 2 SKIN: No obvious rash, lesion, or ulcer.   Results for orders placed or performed during the hospital encounter of 02/20/19 (from the past 24 hour(s))  CBC     Status: Abnormal   Collection Time: 02/22/19  4:19 AM  Result Value Ref Range   WBC 8.9 4.0 - 10.5 K/uL   RBC 2.61 (L) 3.87 - 5.11 MIL/uL   Hemoglobin 8.6 (L) 12.0 - 15.0 g/dL   HCT 16.0 (L) 73.7 - 10.6 %   MCV 103.1 (H) 80.0 - 100.0 fL   MCH 33.0 26.0 - 34.0 pg   MCHC 32.0 30.0 - 36.0 g/dL   RDW 26.9 48.5 - 46.2 %   Platelets 205 150 - 400 K/uL    nRBC 0.0 0.0 - 0.2 %  Basic metabolic panel     Status: Abnormal   Collection Time: 02/22/19  4:19 AM  Result Value Ref Range   Sodium 147 (H) 135 - 145 mmol/L   Potassium 3.5 3.5 - 5.1 mmol/L   Chloride 114 (H) 98 - 111 mmol/L   CO2 24 22 - 32 mmol/L   Glucose, Bld 184 (H) 70 - 99 mg/dL   BUN 36 (H) 8 - 23 mg/dL   Creatinine, Ser 7.03 (H) 0.44 - 1.00 mg/dL   Calcium 8.2 (L) 8.9 - 10.3 mg/dL   GFR calc non Af Amer 28 (L) >60 mL/min   GFR calc Af Amer 32 (L) >60 mL/min   Anion gap 9 5 - 15    Studies/Results: DG Chest 1 View  Result Date: 02/20/2019 CLINICAL DATA:  Initial evaluation for acute trauma, fall. EXAM: CHEST  1 VIEW COMPARISON:  Prior radiograph from 11/01/2018. FINDINGS: Mild cardiomegaly, stable. Mediastinal silhouette within normal limits. Aortic atherosclerosis. Chronic elevation of the right hemidiaphragm, stable from previous. Associated mild linear right basilar subsegmental atelectasis. No other focal airspace disease. No edema or effusion. No visible pneumothorax, although the patient's head partially obscures the right lung apex. No acute osseous abnormality. Diffuse osteopenia. Mild gaseous distension of the transverse colon noted within the upper abdomen. IMPRESSION: 1. No active cardiopulmonary disease. 2. Chronic elevation of the right hemidiaphragm with associated minimal right basilar subsegmental atelectasis. 3.  Aortic Atherosclerosis (  ICD10-I70.0). Electronically Signed   By: Rise Mu M.D.   On: 02/20/2019 20:26   CT Head Wo Contrast  Result Date: 02/20/2019 CLINICAL DATA:  Recent fall 2 days ago with pain, initial encounter EXAM: CT HEAD WITHOUT CONTRAST CT CERVICAL SPINE WITHOUT CONTRAST TECHNIQUE: Multidetector CT imaging of the head and cervical spine was performed following the standard protocol without intravenous contrast. Multiplanar CT image reconstructions of the cervical spine were also generated. COMPARISON:  12/20/2018 FINDINGS: CT HEAD  FINDINGS Brain: Chronic atrophic and ischemic changes are again seen. No findings to suggest acute hemorrhage, acute infarction or space-occupying mass lesion are noted. Vascular: No hyperdense vessel or unexpected calcification. Skull: Normal. Negative for fracture or focal lesion. Sinuses/Orbits: No acute finding. Other: None. CT CERVICAL SPINE FINDINGS Alignment: Within normal limits. Skull base and vertebrae: 7 cervical segments are well visualized. Vertebral body height is well maintained. Multilevel facet hypertrophic changes are noted. No acute fracture or acute facet abnormality is noted. Mild osteophytic changes are seen. Disc space narrowing is also seen primarily at C5-6 and C6-7. Soft tissues and spinal canal: Surrounding soft tissue structures are within normal limits. Carotid calcifications are seen. Upper chest: Visualized lung apices are within normal limits. Other: None IMPRESSION: CT of the head: Chronic atrophic and ischemic changes without acute abnormality. CT of cervical spine: Multilevel degenerative change without acute abnormality. Electronically Signed   By: Alcide Clever M.D.   On: 02/20/2019 20:37   CT Cervical Spine Wo Contrast  Result Date: 02/20/2019 CLINICAL DATA:  Recent fall 2 days ago with pain, initial encounter EXAM: CT HEAD WITHOUT CONTRAST CT CERVICAL SPINE WITHOUT CONTRAST TECHNIQUE: Multidetector CT imaging of the head and cervical spine was performed following the standard protocol without intravenous contrast. Multiplanar CT image reconstructions of the cervical spine were also generated. COMPARISON:  12/20/2018 FINDINGS: CT HEAD FINDINGS Brain: Chronic atrophic and ischemic changes are again seen. No findings to suggest acute hemorrhage, acute infarction or space-occupying mass lesion are noted. Vascular: No hyperdense vessel or unexpected calcification. Skull: Normal. Negative for fracture or focal lesion. Sinuses/Orbits: No acute finding. Other: None. CT CERVICAL  SPINE FINDINGS Alignment: Within normal limits. Skull base and vertebrae: 7 cervical segments are well visualized. Vertebral body height is well maintained. Multilevel facet hypertrophic changes are noted. No acute fracture or acute facet abnormality is noted. Mild osteophytic changes are seen. Disc space narrowing is also seen primarily at C5-6 and C6-7. Soft tissues and spinal canal: Surrounding soft tissue structures are within normal limits. Carotid calcifications are seen. Upper chest: Visualized lung apices are within normal limits. Other: None IMPRESSION: CT of the head: Chronic atrophic and ischemic changes without acute abnormality. CT of cervical spine: Multilevel degenerative change without acute abnormality. Electronically Signed   By: Alcide Clever M.D.   On: 02/20/2019 20:37   DG Hip Port Unilat With Pelvis 1V Left  Result Date: 02/21/2019 CLINICAL DATA:  Left hip hemi arthroplasty for fracture EXAM: DG HIP (WITH OR WITHOUT PELVIS) 1V PORT LEFT COMPARISON:  02/19/2018 FINDINGS: Left hip hemiarthroplasty in satisfactory position alignment. Chronic right fracture fixation with screw and rod. No new fracture in the pelvis IMPRESSION: Satisfactory left hip hemiarthroplasty for fracture. Electronically Signed   By: Marlan Palau M.D.   On: 02/21/2019 15:45   DG Hip Unilat W or Wo Pelvis 2-3 Views Left  Result Date: 02/20/2019 CLINICAL DATA:  Recent fall with hip pain, initial encounter EXAM: DG HIP (WITH OR WITHOUT PELVIS) 3V LEFT COMPARISON:  None. FINDINGS: Pelvic ring is intact. There is a subcapital femoral neck fracture with impaction and angulation at the fracture site. Postsurgical changes in the proximal right femur are noted. Degenerative changes of lumbar spine are seen. IMPRESSION: Left subcapital femoral neck fracture Electronically Signed   By: Inez Catalina M.D.   On: 02/20/2019 20:24    Scheduled Meds: . acetaminophen  500 mg Oral Q6H  . amLODipine  10 mg Oral Daily  .  Chlorhexidine Gluconate Cloth  6 each Topical Daily  . cholecalciferol  2,000 Units Oral Daily  . docusate sodium  100 mg Oral BID  . enoxaparin (LOVENOX) injection  30 mg Subcutaneous Q24H  . feeding supplement (ENSURE ENLIVE)  237 mL Oral BID BM  . ferrous sulfate  325 mg Oral TID PC  . levothyroxine  88 mcg Oral QAC breakfast  . LORazepam  0.25 mg Oral BID  . multivitamin with minerals   Oral Daily  . senna  1 tablet Oral BID  . traMADol  50 mg Oral Q6H   Continuous Infusions: . cefTRIAXone (ROCEPHIN)  IV Stopped (02/22/19 0148)  . dextrose 5 % and 0.45% NaCl 100 mL/hr at 02/22/19 1206  . methocarbamol (ROBAXIN) IV     PRN Meds:acetaminophen, acetaminophen, alum & mag hydroxide-simeth, bisacodyl, HYDROcodone-acetaminophen, HYDROcodone-acetaminophen, labetalol, loperamide, LORazepam, magnesium citrate, menthol-cetylpyridinium **OR** phenol, methocarbamol **OR** methocarbamol (ROBAXIN) IV, morphine injection, ondansetron **OR** ondansetron (ZOFRAN) IV, polyethylene glycol, traZODone  Assessment/Plan:  1.  Left hip fracture secondary to mechanical fall: S/P  left hip hemiarthroplasty; POD 1 - Pain management   - Cont IV hydration  - PT/OT per Ortho direction; consulted  -Follow further orthopedic surgery recs; appreciate Orthopedic Surgery assistance/care.  - Perioperative risk assessment: Functional Status of this Patient <= 4 METS. And Revised Cardiac Risk Index for Pre-Operative Risk: 2 points, which is 10.1 % 30-day risk of death, MI, or cardiac arrest. - May need SNF vs HHC PT/OT   2.  UTI with subsequent sepsis: Vitals stable now -Temperature again was 101.8 axillary this morning - Resolved leukocytosis   - Cont  IV Rocephin for now; may switch to p.o. when p.o. intake improves -Urine culture grew E. coli - BCx X 2 from 131 no growth so far; developed fever again today ordered 2 more sets on 02/22/2019 -Follow  3.  Hypertensive urgency: Resolved now  -  Cont  IV labetalol  PRN - continue amlodipine.  4.  Acute kidney injury superimposed on stage IIIb chronic kidney disease.  -- Improving following hydration  - Cont IV normal  - Follow BMP - Avoid potential nephrotoxins  5.  Hypothyroidism.  -- TSH WNL  - Continue home Synthroid.  6.  DVT prophylaxis.  SCDs for now.  Medical prophylaxis will be held off for postoperative period.  Diet: Poor oral intake at this time  - Reported dementia may also contributing  - On regular diet now and feeding Supp but had to stop per speech therapy recommendation since on 02/22/2019; follow further recs from speech therapist -Also consulted palliative care family/pt for [place]; Liviu consulted -May consult dietitian when able to eat -Change essential medication to IV for now; continue to hold p.o. medications  Disposition: Based on PT/OT eval and Recs   DVT prophylaxis   Addendum: Pt has had multiple temp spikes  - Changed Abx to Vanco and Zosyn per pharmacy for broader coverage, including possible aspiration PNA and post orthopedic surgery  - Blood Cx X 2  - BP and  HR WNL  - Monitor closely     LOS: 2 days   Sabrina Burke Sabrina Burke

## 2019-02-22 NOTE — Progress Notes (Signed)
PT Cancellation Note  Patient Details Name: Sabrina Burke MRN: [id number] DOB: 1927/03/07   Cancelled Treatment:    Reason Eval/Treat Not Completed: Other (comment). Evaluation attempted, however pt nonverbal and is only able to state her first name. Unable to follow direct commands to squeeze fingers or wiggle toes. Pt also with increased temp, RN aware. Pt unable to formally participate in therapy at this time. Will re-attempt in PM for evaluation.   Carnell Casamento 02/22/2019, 9:43 AM Elizabeth Palau, PT, DPT [phone]

## 2019-02-22 NOTE — TOC Progression Note (Signed)
Transition of Care [facility]) - Progression Note    Patient Details  Name: Sabrina Burke MRN: [id number] Date of Birth: 12-Feb-1927  Transition of Care [facility]) CM/SW Contact  Navreet Bolda, Lemar Livings, LCSW Phone Number: 02/22/2019, 3:47 PM  Clinical Narrative:  See speech note regarding making pt NPO. Have asked MD regarding palliative consult to review goals of care. Daughter is involved and has been here to see pt in therapies. MD responded an order is in for palliative care to review goals of care with daughter. Unsure if according to PT may not be eligible for rehab due to dementia and wc level prior to admission at ALF. Will work on best plan for pt with daughter.    Expected Discharge Plan: Skilled Nursing Facility Barriers to Discharge: Continued Medical Work up  Expected Discharge Plan and Services Expected Discharge Plan: Skilled Nursing Facility In-house Referral: Clinical Social Work Discharge Planning Services: NA   Living arrangements for the past 2 months: Assisted Living Facility([facility])                                       Social Determinants of Health (SDOH) Interventions    Readmission Risk Interventions No flowsheet data found.

## 2019-02-22 NOTE — Progress Notes (Signed)
Initial Nutrition Assessment  DOCUMENTATION CODES:   Severe malnutrition in context of social or environmental circumstances, Underweight  INTERVENTION:  No appropriate intervention at this time as patient is NPO per SLP recommendations. Will monitor for outcome of discussions regarding goals of care.  NUTRITION DIAGNOSIS:   Severe Malnutrition related to social / environmental circumstances(dementia, advanced age, inadequate oral intake) as evidenced by severe fat depletion, severe muscle depletion.  GOAL:   Patient will meet greater than or equal to 90% of their needs  MONITOR:   Diet advancement, Labs, Weight trends, Skin, I & O's  REASON FOR ASSESSMENT:   Consult Assessment of nutrition requirement/status  ASSESSMENT:   86 year old female with PMHx of dementia, HTN, CKD, hypothyroidism admitted after mechanical fall with left femoral neck fracture s/p left hip hemiarthroplasty on 2/1, UTI with sepsis, AKI on CKD stage III.   Met with patient at bedside this morning. At that time she was on regular diet and ordered for Ensure Enlive BID. She was unable to provide any history. She had only taken bites at breakfast and was not very alert. Patient has since been evaluated by SLP and has been made NPO. PMT has been consulted to discuss goals of care.  Weight history in chart is difficult to trend as it fluctuates but it appears patient was around 60 kg in 2019. She is now 45.4 kg (100 lbs).  Medications reviewed and include: vitamin D3 2000 units daily, Colace 100 mg BID, ferrous sulfate 325 mg TID, levothyroxine, MVI daily, Ativan, senna 1 tablet BID, Ultram, ceftriaxone, D5-1/2NS at 100 mL/hr.  Labs reviewed: Sodium 147, Chloride 114, BUN 36, Creatinine 1.63.  Discussed with RN and SLP.  NUTRITION - FOCUSED PHYSICAL EXAM:    Most Recent Value  Orbital Region  Severe depletion  Upper Arm Region  Severe depletion  Thoracic and Lumbar Region  Severe depletion  Buccal  Region  Severe depletion  Temple Region  Severe depletion  Clavicle Bone Region  Severe depletion  Clavicle and Acromion Bone Region  Severe depletion  Scapular Bone Region  Unable to assess  Dorsal Hand  Severe depletion  Patellar Region  Severe depletion  Anterior Thigh Region  Severe depletion  Posterior Calf Region  Severe depletion  Edema (RD Assessment)  Mild  Hair  Reviewed  Eyes  Unable to assess  Mouth  Unable to assess  Skin  Reviewed  Nails  Reviewed     Diet Order:   Diet Order            Diet NPO time specified  Diet effective now             EDUCATION NEEDS:   No education needs have been identified at this time  Skin:  Skin Assessment: Skin Integrity Issues:(closed incision left hip)  Last BM:  Unknown  Height:   Ht Readings from Last 1 Encounters:  02/20/19 5' 2"  (1.575 m)   Weight:   Wt Readings from Last 1 Encounters:  02/20/19 45.4 kg   Ideal Body Weight:  50 kg  BMI:  Body mass index is 18.29 kg/m.  Estimated Nutritional Needs:   Kcal:  1200-1400  Protein:  60-70 grams  Fluid:  1.2-1.4 L/day  Jacklynn Barnacle, MS, RD, LDN Office: [phone] Pager: [phone] After Hours/Weekend Pager: [phone]

## 2019-02-22 NOTE — Anesthesia Postprocedure Evaluation (Signed)
Anesthesia Post Note  Patient: Sabrina Burke  Procedure(s) Performed: LEFT HIP HEMIARTHROPLASTY (Left Hip)  Patient location during evaluation: Nursing Unit Anesthesia Type: Spinal Level of consciousness: awake, awake and alert and oriented Pain management: pain level controlled Vital Signs Assessment: post-procedure vital signs reviewed and stable Respiratory status: spontaneous breathing, nonlabored ventilation and respiratory function stable Cardiovascular status: blood pressure returned to baseline and stable Postop Assessment: no headache and no backache Anesthetic complications: no     Last Vitals:  Vitals:   02/22/19 0222 02/22/19 0808  BP: (!) 158/55 140/67  Pulse: 80 78  Resp: 12 14  Temp: 37 C (!) 38.4 C  SpO2: 97% 96%    Last Pain:  Vitals:   02/22/19 0808  TempSrc: Axillary  PainSc:                  [name]

## 2019-02-23 DIAGNOSIS — Z7189 Other specified counseling: Secondary | ICD-10-CM

## 2019-02-23 DIAGNOSIS — Z515 Encounter for palliative care: Secondary | ICD-10-CM

## 2019-02-23 DIAGNOSIS — S72002S Fracture of unspecified part of neck of left femur, sequela: Secondary | ICD-10-CM

## 2019-02-23 DIAGNOSIS — E43 Unspecified severe protein-calorie malnutrition: Secondary | ICD-10-CM | POA: Insufficient documentation

## 2019-02-23 DIAGNOSIS — N390 Urinary tract infection, site not specified: Secondary | ICD-10-CM

## 2019-02-23 DIAGNOSIS — Z66 Do not resuscitate: Secondary | ICD-10-CM

## 2019-02-23 LAB — BASIC METABOLIC PANEL
Anion gap: 7 (ref 5–15)
BUN: 39 mg/dL — ABNORMAL HIGH (ref 8–23)
CO2: 27 mmol/L (ref 22–32)
Calcium: 8.3 mg/dL — ABNORMAL LOW (ref 8.9–10.3)
Chloride: 115 mmol/L — ABNORMAL HIGH (ref 98–111)
Creatinine, Ser: 2.17 mg/dL — ABNORMAL HIGH (ref 0.44–1.00)
GFR calc Af Amer: 23 mL/min — ABNORMAL LOW (ref >60)
GFR calc non Af Amer: 20 mL/min — ABNORMAL LOW (ref >60)
Glucose, Bld: 150 mg/dL — ABNORMAL HIGH (ref 70–99)
Potassium: 3.6 mmol/L (ref 3.5–5.1)
Sodium: 149 mmol/L — ABNORMAL HIGH (ref 135–145)

## 2019-02-23 LAB — CBC
HCT: 26.4 % — ABNORMAL LOW (ref 36.0–46.0)
Hemoglobin: 8.1 g/dL — ABNORMAL LOW (ref 12.0–15.0)
MCH: 32.5 pg (ref 26.0–34.0)
MCHC: 30.7 g/dL (ref 30.0–36.0)
MCV: 106 fL — ABNORMAL HIGH (ref 80.0–100.0)
Platelets: 218 10*3/uL (ref 150–400)
RBC: 2.49 MIL/uL — ABNORMAL LOW (ref 3.87–5.11)
RDW: 12.4 % (ref 11.5–15.5)
WBC: 9.7 10*3/uL (ref 4.0–10.5)
nRBC: 0 % (ref 0.0–0.2)

## 2019-02-23 LAB — SURGICAL PATHOLOGY

## 2019-02-23 MED ORDER — HEPARIN SODIUM (PORCINE) 5000 UNIT/ML IJ SOLN
5000.0000 [IU] | Freq: Three times a day (TID) | INTRAMUSCULAR | Status: DC
  Administered 2019-02-23 – 2019-02-26 (×8): 5000 [IU] via SUBCUTANEOUS
  Filled 2019-02-23 (×9): qty 1

## 2019-02-23 NOTE — Evaluation (Signed)
Occupational Therapy Evaluation Patient Details Name: Sabrina Burke MRN: [id number] DOB: 1927/05/12 Today's Date: 02/23/2019    History of Present Illness Pt admitted for L hip fracture s/p falling out of bed at ALF. Pt is now s/p L hip hemiarthroplasty, post approach on 02/21/19. Has history of dementia and HTN.   Clinical Impression   Sabrina Burke was seen for OT evaluation this date. Prior to hospital admission, pt received care in a memory care facility. She required +2 total assist for transfer to [facility] at baseline due to advanced dementia. Pt received supine in bed, pt responds to VCs, but remains pleasantly confused t/o OT evaluation. She is able to state her name, but cannot state place, date, or situation. When this Thereasa Parkin informs pt she is in the hospital because she had a fall pt states "well sh*t!" and does appear to recognize she is in the hospital briefly. Pt cannot consistently follow VCs during session. Pt intermittently responds appropriately to this author, and endorses pain in her "neck and back". She requires total assist to reposition hips and shoulders in bed. Currently pt demonstrates impairments as described below (See OT problem list) which functionally limit her ability to perform ADL/self-care tasks. Pt currently requires total assist for all bed mobility and max to total assist for bed level ADL management.  Pt would benefit from skilled OT to address noted impairments and functional limitations (see below for any additional details) in order to maximize safety and independence while minimizing falls risk and caregiver burden. Upon hospital discharge, recommend pt return to her memory care facility with [facility] services to maximize safety and functional independence during meaningful occupations of daily life.      Follow Up Recommendations  Supervision/Assistance - 24 hour;Home health OT;Other (comment)(Return to [facility].)    Equipment Recommendations  Other  (comment)(Defer to next venue of care.)    Recommendations for Other Services       Precautions / Restrictions Precautions Precautions: Fall;Posterior Hip Precaution Booklet Issued: No Restrictions Weight Bearing Restrictions: Yes LLE Weight Bearing: Weight bearing as tolerated      Mobility Bed Mobility Overal bed mobility: Needs Assistance Bed Mobility: Supine to Sit;Sit to Supine     Supine to sit: Total assist Sit to supine: Total assist   General bed mobility comments: This author attempts to position pt bed in chair position, but pt yells out in pain stating "that hurts". She requires total assist to reposition shoulders/BLE in bed this date. further mobility deferred for pt comfort/safety. Per chart required total assist for sup<>sit  Transfers                 General transfer comment: unsafe to trial at this time    Balance Overall balance assessment: History of Falls Sitting-balance support: Bilateral upper extremity supported;Feet supported Sitting balance-Leahy Scale: Poor Sitting balance - Comments: Pt with severe posterior push requiring max assist to prevent LOB while seated EOB. Unable to follow commands. sat EOB x several minutes prior to total assist to return and reposition to supine.                                    ADL either performed or assessed with clinical judgement   ADL Overall ADL's : Needs assistance/impaired  General ADL Comments: Pt significantly functionally limited by impaired cognition, decreased activity tolerance, decreased AROM in BUE, and pain this date. Pt unsafe to attempt functional mobility as cognitive status limits education on posterior THPs. This Pryor Curia attempts to engage pt in bed level grooming tasks, however pt unable to bruing either UE up to face for face washing and inconsistently follows VCs t/o session. Pt would benefit from futher BUE ther-ex to  promote improved joint range/mobility as well as improved participation in ADL tasks. Will contin     Vision   Additional Comments: Pt unable to state     Perception     Praxis      Pertinent Vitals/Pain Pain Assessment: Faces Pain Score: 8  Faces Pain Scale: Hurts little more Pain Location: Pt endorses her "back and neck" hurt during session. Pain Descriptors / Indicators: Grimacing;Guarding;Moaning;Sore Pain Intervention(s): Limited activity within patient's tolerance;Repositioned;Monitored during session     Hand Dominance     Extremity/Trunk Assessment Upper Extremity Assessment Upper Extremity Assessment: Difficult to assess due to impaired cognition(Pt noted to have increased tone throughout BUE, unsure if this is due to pt resisting PROM, pain, or joint immobility. Pt is able to participate more with LUE this date. Cannot bring either UE to face for functional task. Cannot grip hands with VCs.)   Lower Extremity Assessment Lower Extremity Assessment: Difficult to assess due to impaired cognition(Per chart, pt non-ambulatory. Currently under posterior THPs for LLE s/o Hemiarth.)       Communication     Cognition Arousal/Alertness: Awake/alert Behavior During Therapy: Flat affect Overall Cognitive Status: History of cognitive impairments - at baseline                                 General Comments: pt was awake and alert throughout however very confused and inconsistantly following commands. Unable to state DOB/location/time/day etc.   General Comments       Exercises Exercises: Other exercises Shoulder Exercises Shoulder Flexion: PROM;AAROM;Both;10 reps Shoulder ABduction: PROM;AAROM;10 reps Elbow Flexion: PROM;Both;AAROM;10 reps Elbow Extension: PROM;AAROM;Both;10 reps Wrist Flexion: PROM;AAROM;10 reps;Both Wrist Extension: PROM;AAROM;10 reps;Both Other Exercises Other Exercises: Attemptedto perform supine ther ex. pt was able to follow only  a few commands and inconsistanly performed desired exercises requested. She was able to perform heel slides on LLE through minimal ROM with assistance. She did perform UE task but limited participation. Unable to perform other exrecises requested. Other Exercises: This Pryor Curia attempts to engage pt in bed level grooming tasks, however pt limited by cognition. Inconsistently able to follow 1 step VCs with multimodal cueing and max assist. This Pryor Curia engages pt in BUE ther-ex as listed above. Pt noted to have difficulty with AROM with her RUE more significantly limited than her LUE this date.   Shoulder Instructions      Home Living                                          Prior Functioning/Environment                   OT Problem List: Decreased strength;Decreased coordination;Decreased range of motion;Cardiopulmonary status limiting activity;Pain;Decreased activity tolerance;Decreased safety awareness;Decreased cognition;Impaired balance (sitting and/or standing);Decreased knowledge of use of DME or AE;Impaired UE functional use      OT Treatment/Interventions: Self-care/ADL training;Therapeutic exercise;Therapeutic activities;DME and/or AE  instruction;Patient/family education;Balance training    OT Goals(Current goals can be found in the care plan section) Acute Rehab OT Goals Patient Stated Goal: Pt unable to state OT Goal Formulation: Patient unable to participate in goal setting Time For Goal Achievement: 03/09/19 ADL Goals Pt Will Perform Grooming: sitting;with mod assist;with max assist(Given set-up/cueing as needed for safety.) Pt Will Transfer to Toilet: bedside commode;squat pivot transfer;with +2 assist;with max assist(With cueing and LRAD PRN for safety and adherence to posterior THPs) Additional ADL Goal #1: Pt will perform sup<>sit transfer given moderate assist +2 and cueing PRN for safety and adherence to posterior hip precautions.  OT Frequency: Min  1X/week   Barriers to D/C:            Co-evaluation              AM-PAC OT "6 Clicks" Daily Activity     Outcome Measure Help from another person eating meals?: A Lot(Pt NPO per SLP) Help from another person taking care of personal grooming?: A Lot Help from another person toileting, which includes using toliet, bedpan, or urinal?: Total Help from another person bathing (including washing, rinsing, drying)?: Total Help from another person to put on and taking off regular upper body clothing?: A Lot Help from another person to put on and taking off regular lower body clothing?: A Lot 6 Click Score: 10   End of Session    Activity Tolerance: Patient tolerated treatment well Patient left: in bed;with call bell/phone within reach;with bed alarm set;with SCD's reapplied;Other (comment)(With wedge pillow in place at start/end of session.)  OT Visit Diagnosis: Other abnormalities of gait and mobility (R26.89);History of falling (Z91.81)                Time: 9242-6834 OT Time Calculation (min): 23 min Charges:  OT General Charges $OT Visit: 1 Visit OT Evaluation $OT Eval High Complexity: 1 High OT Treatments $Self Care/Home Management : 8-22 mins  Rockney Ghee, M.S., OTR/L Ascom: [phone] 02/23/19, 10:52 AM

## 2019-02-23 NOTE — Progress Notes (Signed)
D: Pt alert and oriented to person. Pt denies experiencing pain this morning however this evening pt began to call out and express experiencing pain in surgical pain by yelling out and stating "my leg hurts". Pt's BP was elevated with this pain, PRN pain meds were given and BP rechecked in 1 hour.   A: Scheduled medications administered to pt, per MD orders. Support and encouragement provided. Frequent verbal contact made.    R: No adverse drug reactions noted. Pt refused any kind of VTE injections (heparin/lovenox), stating they were not going to be given a shot, they did not want any shots. Pt interacts well with staff on the unit. Pt is stable at this time, will continue to monitor and provide care for as ordered.

## 2019-02-23 NOTE — Progress Notes (Signed)
Speech Language Pathology Treatment: Dysphagia  Patient Details Name: Sabrina Burke MRN: [id number] DOB: [birth year] Today's Date: 02/23/2019 Time: 2423-5361 SLP Time Calculation (min) (ACUTE ONLY): 40 min  Assessment / Plan / Recommendation Clinical Impression  Pt seen today for ongoing assessment of swallowing function in hopes to establish an oral diet. Pt exhibited reduced alertness at BSE yesterday and determined to be too at risk for aspiration thus made NPO. Pt is exhibiting increased alertness and engagement w/ staff today. She continues to present w/ significant Cognitive decline; baseline Dementia. Oral care given; noted increased phlegm and sticky secretions which were cleared. Pt required full assistance to sit midline w/ head forward. Pt has min Kyphotic head position. Pt exhibited increased attention to task during this assessment today; min more verbal but did not follow any instructions. Pt exhibited increased oropharyngeal awareness for engagement in po tasks. She accepted po trials by opening mouth to gentle tactile/verbal stim of spoon at lips. Noted oral phase deficits c/b decreased labial closure on spoon, oral holding, and delay in A-P transfer of bolus trials. Pt was presented w/ 1/2 tsp trials of Honey and Nectar consistency liquids then purees, single ice chips prior. No immediate, overt clinical s/s of aspiration noted; pharyngeal swallows appeared grossly adequate and complete to clear bolus material - f/u swallows noted only 2-3x. Excursion was grossly adequate. Oral holding noted moreso w/ puree trials but given time and tactile/verbal encouragement, she appeared to clear fully. Oral phase was supported by alternating foods/liquids and small TSP amounts - no cup or straw use w/ Nectar liquids.  Due to pt's improved presentation today, discussed w/ Palliative Care NP and MD re: initiation of oral diet. Recommend a Dysphagia level 1(puree) w/ Nectar liquids VIA TSP; strict  aspiration precautions and feeding support checking for oral clearing w/ all trials given. Pills given Crushed in Puree. Hold any po's if pt is not following through w/ swallowing and oral clearing. Suspect pt's Baseline Dementia impacted by Acute illness may be interfering w/ pt's ability for full oral awareness and engagement of tasks of swallowing as well as her overall engagement. Pt is at risk for inability to meet nutrition/hydration needs orally. ST services will f/u w/ pt's status while admitted. Recommend f/u w/ Palliative Care for [place]. NSG updated. Precautions posted.    HPI HPI: Pt is a 86 y.o. female with a known history of Dementia and hypertension, who presented to the emergency room with acute onset of left hip pain after having a mechanical fall at [facility] facility.  Apparently this happened several days ago and she complained of pain until today when she was moved.  She is nonverbal and therefore no history could be obtained from her.  She was sent to the ER to have x-ray of the left hip which showed left subcapital hip fracture.  Pt is POD #1 s/p left hip hemiarthroplasty Orthopedic noted.       SLP Plan  Continue with current plan of care       Recommendations  Diet recommendations: Dysphagia 1 (puree);Nectar-thick liquid Liquids provided via: Teaspoon;No straw Medication Administration: Crushed with puree(for safer swallowing) Supervision: Staff to assist with self feeding;Full supervision/cueing for compensatory strategies Compensations: Minimize environmental distractions;Slow rate;Small sips/bites;Lingual sweep for clearance of pocketing;Multiple dry swallows after each bite/sip;Follow solids with liquid Postural Changes and/or Swallow Maneuvers: Seated upright 90 degrees;Upright 30-60 min after meal                General recommendations: (  Palliative Care f/u for GOC; Dietician f/u) Oral Care Recommendations: Oral care QID;Staff/trained  caregiver to provide oral care;Oral care before and after PO Follow up Recommendations: Skilled Nursing facility(TBD) SLP Visit Diagnosis: Dysphagia, oropharyngeal phase (R13.12)(baseline Dementia) Plan: Continue with current plan of care       GO                 Jerilynn Som, MS, CCC-SLP Marlei Glomski 02/23/2019, 3:28 PM

## 2019-02-23 NOTE — Progress Notes (Signed)
PALLIATIVE NOTE:  Consult received for goals of care. Chart reviewed. I met with patient at the bedside today. I observed her working with PT this morning and I also was at the bedside to observe patient working with Katherine, SLP.   I called and spoke with daughter, Lea via phone. I introduced myself and Palliative's role in her mother's care. She verbalized understanding and appreciation. Lea is planning to come and visit her mom later today.   As requested I will meet daughter at the bedside at 2pm today.   Thank you for your referral and allowing Palliative to assist in Mrs. Handy's care.   Detailed note and recommendations to follow completed GOC discussion.   Nikki Pickenpack-Cousar, AGPCNP-BC  Palliative Medicine Team [phone]  NO CHARGE   

## 2019-02-23 NOTE — Progress Notes (Signed)
Subjective:  POD #2 s/p left hip hemiarthroplasty.  Patient with dementia.  She is more alert today.  Patient denies any significant left hip pain.  Patient currently afebrile.  Objective:   VITALS:   Vitals:   02/23/19 0000 02/23/19 0643 02/23/19 0931 02/23/19 1252  BP: (!) 125/51 132/67 134/82 (!) 157/60  Pulse: 70 69 67 79  Resp: 11 11    Temp: (!) 97.5 F (36.4 C) 98 F (36.7 C) (!) 97.3 F (36.3 C) 98.2 F (36.8 C)  TempSrc: Oral Oral Oral Oral  SpO2: 96% 97% 98% 95%  Weight:      Height:        PHYSICAL EXAM: Left lower extremity Neurovascular intact Sensation intact distally Intact pulses distally Dorsiflexion/Plantar flexion intact Incision: scant drainage No cellulitis present Compartment soft  LABS  Results for orders placed or performed during the hospital encounter of 02/20/19 (from the past 24 hour(s))  CBC     Status: Abnormal   Collection Time: 02/23/19  5:53 AM  Result Value Ref Range   WBC 9.7 4.0 - 10.5 K/uL   RBC 2.49 (L) 3.87 - 5.11 MIL/uL   Hemoglobin 8.1 (L) 12.0 - 15.0 g/dL   HCT 62.6 (L) 94.8 - 54.6 %   MCV 106.0 (H) 80.0 - 100.0 fL   MCH 32.5 26.0 - 34.0 pg   MCHC 30.7 30.0 - 36.0 g/dL   RDW 27.0 35.0 - 09.3 %   Platelets 218 150 - 400 K/uL   nRBC 0.0 0.0 - 0.2 %  Basic metabolic panel     Status: Abnormal   Collection Time: 02/23/19  5:53 AM  Result Value Ref Range   Sodium 149 (H) 135 - 145 mmol/L   Potassium 3.6 3.5 - 5.1 mmol/L   Chloride 115 (H) 98 - 111 mmol/L   CO2 27 22 - 32 mmol/L   Glucose, Bld 150 (H) 70 - 99 mg/dL   BUN 39 (H) 8 - 23 mg/dL   Creatinine, Ser 8.18 (H) 0.44 - 1.00 mg/dL   Calcium 8.3 (L) 8.9 - 10.3 mg/dL   GFR calc non Af Amer 20 (L) >60 mL/min   GFR calc Af Amer 23 (L) >60 mL/min   Anion gap 7 5 - 15    DG CHEST PORT 1 VIEW  Result Date: 02/22/2019 CLINICAL DATA:  Fever. EXAM: PORTABLE CHEST 1 VIEW COMPARISON:  02/20/2019. FINDINGS: Mediastinum and hilar structures normal. Mild bibasilar  atelectasis. No focal alveolar infiltrate. No pleural effusion or pneumothorax. Stable elevation right hemidiaphragm. Degenerative change thoracic spine. Degenerative changes both shoulders. IMPRESSION: Mild bibasilar atelectasis. Stable elevation right hemidiaphragm. Chest is stable from prior exam. Electronically Signed   By: Maisie Fus  Register   On: 02/22/2019 13:59   DG Hip Port Unilat With Pelvis 1V Left  Result Date: 02/21/2019 CLINICAL DATA:  Left hip hemi arthroplasty for fracture EXAM: DG HIP (WITH OR WITHOUT PELVIS) 1V PORT LEFT COMPARISON:  02/19/2018 FINDINGS: Left hip hemiarthroplasty in satisfactory position alignment. Chronic right fracture fixation with screw and rod. No new fracture in the pelvis IMPRESSION: Satisfactory left hip hemiarthroplasty for fracture. Electronically Signed   By: Marlan Palau M.D.   On: 02/21/2019 15:45    Assessment/Plan: 2 Days Post-Op   Active Problems:   Closed left hip fracture (HCC)   Fever   Protein-calorie malnutrition, severe  Patient more alert today.  To be physical therapy as the patient can tolerate.  Patient is weightbearing as tolerated on the left lower  extremity with posterior hip precautions.  Continue Lovenox for DVT prophylaxis x2 weeks postop.  Patient will follow-up in my office 10 to 14 days following discharge from the hospital.  Patient is ok for discharge from an orthopedic standpoint.    Thornton Park , MD 02/23/2019, 1:36 PM

## 2019-02-23 NOTE — Progress Notes (Signed)
Physical Therapy Treatment Patient Details Name: Sabrina Burke MRN: [id number] DOB: 12/15/1927 Today's Date: 02/23/2019    History of Present Illness Pt admitted for L hip fracture s/p falling out of bed at ALF. Pt is now s/p L hip hemiarthroplasty, post approach on 02/21/19. Has history of dementia and HTN.    PT Comments    Pt was long sitting in bed upon arriving.She was on 1 L o2 with O2 93%. She was awake throughout session but confused and not oriented. She does respond to questions but inconsistently. Pt required total assist to supine to EOB short sit. She yells out in pain but once seated EOB was able to tolerate sitting x a few minutes. Max assist to maintain EOB sitting. Poor balance and ability to follow commands. Pt's cognition limit session progression. She was repositioned supine in bed post session with call bell in reach, bed alarm set, hip abduction pillow placed with SCDs running. PT will continue to follow pt per POC.    Follow Up Recommendations  Supervision/Assistance - 24 hour;Home health PT(return to [facility] )     Equipment Recommendations  None recommended by PT    Recommendations for Other Services       Precautions / Restrictions Precautions Precautions: Fall;Posterior Hip Precaution Booklet Issued: No Restrictions Weight Bearing Restrictions: Yes LLE Weight Bearing: Weight bearing as tolerated    Mobility  Bed Mobility Overal bed mobility: Needs Assistance Bed Mobility: Supine to Sit;Sit to Supine     Supine to sit: Total assist Sit to supine: Total assist   General bed mobility comments: Pt required total assist to supine to EOB short sit with max vcs for safety. Therapist assisted BLEs and UE supported to/from EOB. increased time to perform. pt yells out in pain with movements however once seated EOB tolerated sitting x 5 minutes.  Transfers                 General transfer comment: unsafe to trial at this time  Ambulation/Gait                  Stairs             Wheelchair Mobility    Modified Rankin (Stroke Patients Only)       Balance Overall balance assessment: History of Falls Sitting-balance support: Bilateral upper extremity supported;Feet supported Sitting balance-Leahy Scale: Poor Sitting balance - Comments: Pt with severe posterior push requiring max assist to prevent LOB while seated EOB. Unable to follow commands. sat EOB x several minutes prior to total assist to return and reposition to supine.                                     Cognition Arousal/Alertness: Awake/alert Behavior During Therapy: Flat affect Overall Cognitive Status: History of cognitive impairments - at baseline                                 General Comments: pt was awake and alert throughout however very confused and inconsistantly following commands. Unable to state orientation/location/time/day etc.       Exercises Other Exercises Other Exercises: Attemptedto perform supine ther ex. pt was able to follow only a few commands and inconsistanly performed desired exercises requested. She was able to perform heel slides on LLE through minimal ROM with assistance. She did perform  UE task but limited participation. Unable to perform other exrecises requested.    General Comments        Pertinent Vitals/Pain Pain Assessment: Faces Pain Score: 8  Faces Pain Scale: Hurts whole lot Pain Location: L hip Pain Descriptors / Indicators: Crying Pain Intervention(s): Monitored during session;Limited activity within patient's tolerance    Home Living                      Prior Function            PT Goals (current goals can now be found in the care plan section) Acute Rehab PT Goals Patient Stated Goal: " Hurt all over"    Frequency    7X/week      PT Plan Current plan remains appropriate    Co-evaluation              AM-PAC PT "6 Clicks" Mobility    Outcome Measure  Help needed turning from your back to your side while in a flat bed without using bedrails?: Total Help needed moving from lying on your back to sitting on the side of a flat bed without using bedrails?: Total Help needed moving to and from a bed to a chair (including a wheelchair)?: Total Help needed standing up from a chair using your arms (e.g., wheelchair or bedside chair)?: Total Help needed to walk in hospital room?: Total Help needed climbing 3-5 steps with a railing? : Total 6 Click Score: 6    End of Session   Activity Tolerance: Patient limited by pain Patient left: in bed;with bed alarm set;with call bell/phone within reach;with SCD's reapplied Nurse Communication: Mobility status PT Visit Diagnosis: Muscle weakness (generalized) (M62.81);History of falling (Z91.81)     Time: 3662-9476 PT Time Calculation (min) (ACUTE ONLY): 25 min  Charges:  $Therapeutic Activity: 23-37 mins                     Julaine Fusi PTA 02/23/19, 10:30 AM

## 2019-02-23 NOTE — Progress Notes (Signed)
PROGRESS NOTE    Sabrina Burke  ALP:[id number] DOB: [birth year] DOA: 02/20/2019 PCP: Devoria Glassing, NP    Brief Narrative:  Sabrina Burke  is a 86 y.o. pleasantly demented Caucasian female with a known history of dementia and hypertension, who presented to the emergency room with acute onset of left hip pain after having a mechanical fall at [facility].    Consultants:   Ortho  Procedures:  S/P left hip hemiarthroplasty on 02/21/19  Antimicrobials:   Zosyn   Subjective: Patient appears confused.  When asked how she feels she reports abdominal.  Sitting up in bed.  NAD, Tmax 101.3  Objective: Vitals:   02/23/19 0000 02/23/19 0643 02/23/19 0931 02/23/19 1252  BP: (!) 125/51 132/67 134/82 (!) 157/60  Pulse: 70 69 67 79  Resp: 11 11    Temp: (!) 97.5 F (36.4 C) 98 F (36.7 C) (!) 97.3 F (36.3 C) 98.2 F (36.8 C)  TempSrc: Oral Oral Oral Oral  SpO2: 96% 97% 98% 95%  Weight:      Height:        Intake/Output Summary (Last 24 hours) at 02/23/2019 1427 Last data filed at 02/23/2019 0612 Gross per 24 hour  Intake 1597.04 ml  Output 250 ml  Net 1347.04 ml   Filed Weights   02/20/19 1951  Weight: 45.4 kg    Examination:  General exam: Appears calm and comfortable , confused, nad Respiratory system: Clear to auscultation. With poor respiratory effort normal. Cardiovascular system: S1 & S2 heard, RRR. No  murmurs, rubs, gallops or clicks.  Gastrointestinal system: Abdomen is nondistended, soft and nontender.  Normal bowel sounds heard. Central nervous system: awake, not oriented at all, confused Extremities:no edema, LLE surgical wrap Skin: warm, dry Psychiatry: unable to assess.     Data Reviewed: I have personally reviewed following labs and imaging studies  CBC: Recent Labs  Lab 02/20/19 2053 02/21/19 0442 02/22/19 0419 02/23/19 0553  WBC 13.2* 10.7* 8.9 9.7  NEUTROABS 9.4*  --   --   --   HGB 10.3* 9.7* 8.6* 8.1*  HCT  32.0* 31.1* 26.9* 26.4*  MCV 101.3* 103.3* 103.1* 106.0*  PLT 266 196 205 218   Basic Metabolic Panel: Recent Labs  Lab 02/20/19 2053 02/21/19 0442 02/22/19 0419 02/23/19 0553  NA 145 150* 147* 149*  K 4.0 3.9 3.5 3.6  CL 109 114* 114* 115*  CO2 26 23 24 27   GLUCOSE 154* 120* 184* 150*  BUN 50* 49* 36* 39*  CREATININE 2.01* 1.75* 1.63* 2.17*  CALCIUM 8.9 8.5* 8.2* 8.3*   GFR: Estimated Creatinine Clearance: 12.6 mL/min (A) (by C-G formula based on SCr of 2.17 mg/dL (H)). Liver Function Tests: Recent Labs  Lab 02/20/19 2053  AST 27  ALT 22  ALKPHOS 77  BILITOT 0.6  PROT 7.2  ALBUMIN 2.9*   No results for input(s): LIPASE, AMYLASE in the last 168 hours. No results for input(s): AMMONIA in the last 168 hours. Coagulation Profile: Recent Labs  Lab 02/20/19 2053  INR 1.1   Cardiac Enzymes: No results for input(s): CKTOTAL, CKMB, CKMBINDEX, TROPONINI in the last 168 hours. BNP (last 3 results) No results for input(s): PROBNP in the last 8760 hours. HbA1C: No results for input(s): HGBA1C in the last 72 hours. CBG: Recent Labs  Lab 02/20/19 1955  GLUCAP 151*   Lipid Profile: No results for input(s): CHOL, HDL, LDLCALC, TRIG, CHOLHDL, LDLDIRECT in the last 72 hours. Thyroid Function Tests: Recent Labs  02/21/19 0442  TSH 2.342   Anemia Panel: No results for input(s): VITAMINB12, FOLATE, FERRITIN, TIBC, IRON, RETICCTPCT in the last 72 hours. Sepsis Labs: Recent Labs  Lab 02/20/19 2053  LATICACIDVEN 1.1    Recent Results (from the past 240 hour(s))  Respiratory Panel by RT PCR (Flu A&B, Covid) - Nasopharyngeal Swab     Status: None   Collection Time: 02/20/19  8:36 PM   Specimen: Nasopharyngeal Swab  Result Value Ref Range Status   SARS Coronavirus 2 by RT PCR NEGATIVE NEGATIVE Final    Comment: (NOTE) SARS-CoV-2 target nucleic acids are NOT DETECTED. The SARS-CoV-2 RNA is generally detectable in upper respiratoy specimens during the acute phase  of infection. The lowest concentration of SARS-CoV-2 viral copies this assay can detect is 131 copies/mL. A negative result does not preclude SARS-Cov-2 infection and should not be used as the sole basis for treatment or other patient management decisions. A negative result may occur with  improper specimen collection/handling, submission of specimen other than nasopharyngeal swab, presence of viral mutation(s) within the areas targeted by this assay, and inadequate number of viral copies (<131 copies/mL). A negative result must be combined with clinical observations, patient history, and epidemiological information. The expected result is Negative. Fact Sheet for Patients:  PinkCheek.be Fact Sheet for Healthcare Providers:  GravelBags.it This test is not yet ap proved or cleared by the [place] FDA and  has been authorized for detection and/or diagnosis of SARS-CoV-2 by FDA under an Emergency Use Authorization (EUA). This EUA will remain  in effect (meaning this test can be used) for the duration of the COVID-19 declaration under Section 564(b)(1) of the Act, 21 U.S.C. section 360bbb-3(b)(1), unless the authorization is terminated or revoked sooner.    Influenza A by PCR NEGATIVE NEGATIVE Final   Influenza B by PCR NEGATIVE NEGATIVE Final    Comment: (NOTE) The Xpert Xpress SARS-CoV-2/FLU/RSV assay is intended as an aid in  the diagnosis of influenza from Nasopharyngeal swab specimens and  should not be used as a sole basis for treatment. Nasal washings and  aspirates are unacceptable for Xpert Xpress SARS-CoV-2/FLU/RSV  testing. Fact Sheet for Patients: PinkCheek.be Fact Sheet for Healthcare Providers: GravelBags.it This test is not yet approved or cleared by the [place] FDA and  has been authorized for detection and/or diagnosis of SARS-CoV-2 by  FDA  under an Emergency Use Authorization (EUA). This EUA will remain  in effect (meaning this test can be used) for the duration of the  Covid-19 declaration under Section 564(b)(1) of the Act, 21  U.S.C. section 360bbb-3(b)(1), unless the authorization is  terminated or revoked. Performed at [facility], [address] [place], [place] [address]   Blood culture (routine x 2)     Status: None (Preliminary result)   Collection Time: 02/20/19  8:53 PM   Specimen: BLOOD  Result Value Ref Range Status   Specimen Description BLOOD RIGHT ANTECUBITAL  Final   Special Requests   Final    BOTTLES DRAWN AEROBIC AND ANAEROBIC Blood Culture results may not be optimal due to an inadequate volume of blood received in culture bottles   Culture   Final    NO GROWTH 3 DAYS Performed at [facility], [address], [place], [place] [address]    Report Status PENDING  Incomplete  Blood culture (routine x 2)     Status: None (Preliminary result)   Collection Time: 02/20/19  8:53 PM   Specimen: BLOOD  Result Value Ref Range Status   Specimen Description BLOOD BLOOD LEFT HAND  Final   Special Requests   Final    BOTTLES DRAWN AEROBIC AND ANAEROBIC Blood Culture adequate volume   Culture   Final    NO GROWTH 3 DAYS Performed at [facility], [address], [place], [place] [address]    Report Status PENDING  Incomplete  Urine Culture     Status: Abnormal   Collection Time: 02/20/19 11:44 PM   Specimen: Urine, Clean Catch  Result Value Ref Range Status   Specimen Description   Final    URINE, CLEAN CATCH Performed at [facility], [address] [place], [place] [address]    Special Requests   Final    NONE Performed at [facility], [address] [place], [place] [address]    Culture >=100,000 COLONIES/mL ESCHERICHIA COLI (A)  Final   Report Status 02/22/2019 FINAL  Final   Organism ID, Bacteria ESCHERICHIA COLI (A)  Final       Susceptibility   Escherichia coli - MIC*    AMPICILLIN 8 SENSITIVE Sensitive     CEFAZOLIN <=4 SENSITIVE Sensitive     CEFTRIAXONE <=0.25 SENSITIVE Sensitive     CIPROFLOXACIN <=0.25 SENSITIVE Sensitive     GENTAMICIN <=1 SENSITIVE Sensitive     IMIPENEM <=0.25 SENSITIVE Sensitive     NITROFURANTOIN <=16 SENSITIVE Sensitive     TRIMETH/SULFA <=20 SENSITIVE Sensitive     AMPICILLIN/SULBACTAM <=2 SENSITIVE Sensitive     PIP/TAZO <=4 SENSITIVE Sensitive     * >=100,000 COLONIES/mL ESCHERICHIA COLI  Surgical pcr screen     Status: None   Collection Time: 02/21/19 12:50 AM   Specimen: Nasal Mucosa; Nasal Swab  Result Value Ref Range Status   MRSA, PCR NEGATIVE NEGATIVE Final   Staphylococcus aureus NEGATIVE NEGATIVE Final    Comment: (NOTE) The Xpert SA Assay (FDA approved for NASAL specimens in patients 69 years of age and older), is one component of a comprehensive surveillance program. It is not intended to diagnose infection nor to guide or monitor treatment. Performed at [facility], [address] [place], [place] [address]   CULTURE, BLOOD (ROUTINE X 2) w Reflex to ID Panel     Status: None (Preliminary result)   Collection Time: 02/22/19  1:02 PM   Specimen: BLOOD  Result Value Ref Range Status   Specimen Description BLOOD BLOOD RIGHT HAND  Final   Special Requests   Final    BOTTLES DRAWN AEROBIC AND ANAEROBIC Blood Culture results may not be optimal due to an inadequate volume of blood received in culture bottles   Culture   Final    NO GROWTH < 24 HOURS Performed at [facility], [address], [place], [place] [address]    Report Status PENDING  Incomplete  CULTURE, BLOOD (ROUTINE X 2) w Reflex to ID Panel     Status: None (Preliminary result)   Collection Time: 02/22/19  1:08 PM   Specimen: BLOOD  Result Value Ref Range Status   Specimen Description BLOOD RIGHT ANTECUBITAL  Final   Special Requests   Final    BOTTLES DRAWN AEROBIC AND  ANAEROBIC Blood Culture adequate volume   Culture   Final    NO GROWTH < 24 HOURS Performed at [facility], [address] [place], [place] [address]    Report Status PENDING  Incomplete         Radiology Studies: DG CHEST  PORT 1 VIEW  Result Date: 02/22/2019 CLINICAL DATA:  Fever. EXAM: PORTABLE CHEST 1 VIEW COMPARISON:  02/20/2019. FINDINGS: Mediastinum and hilar structures normal. Mild bibasilar atelectasis. No focal alveolar infiltrate. No pleural effusion or pneumothorax. Stable elevation right hemidiaphragm. Degenerative change thoracic spine. Degenerative changes both shoulders. IMPRESSION: Mild bibasilar atelectasis. Stable elevation right hemidiaphragm. Chest is stable from prior exam. Electronically Signed   By: Maisie Fus  Register   On: 02/22/2019 13:59   DG Hip Port Unilat With Pelvis 1V Left  Result Date: 02/21/2019 CLINICAL DATA:  Left hip hemi arthroplasty for fracture EXAM: DG HIP (WITH OR WITHOUT PELVIS) 1V PORT LEFT COMPARISON:  02/19/2018 FINDINGS: Left hip hemiarthroplasty in satisfactory position alignment. Chronic right fracture fixation with screw and rod. No new fracture in the pelvis IMPRESSION: Satisfactory left hip hemiarthroplasty for fracture. Electronically Signed   By: Marlan Palau M.D.   On: 02/21/2019 15:45        Scheduled Meds: . Chlorhexidine Gluconate Cloth  6 each Topical Daily  . cholecalciferol  2,000 Units Oral Daily  . docusate sodium  100 mg Oral BID  . ferrous sulfate  325 mg Oral TID PC  . heparin injection (subcutaneous)  5,000 Units Subcutaneous Q8H  . levothyroxine  88 mcg Intravenous Daily  . LORazepam  0.25 mg Oral BID  . metoprolol tartrate  2.5 mg Intravenous Q12H  . multivitamin with minerals   Oral Daily  . senna  1 tablet Oral BID  . traMADol  50 mg Oral Q6H   Continuous Infusions: . dextrose 5 % and 0.45% NaCl 100 mL/hr at 02/23/19 0241  . methocarbamol (ROBAXIN) IV    . piperacillin-tazobactam (ZOSYN)  IV  3.375 g (02/23/19 6144)    Assessment & Plan:   Active Problems:   Closed left hip fracture (HCC)   Fever   Protein-calorie malnutrition, severe   1. Left hip fracture- secondary to mechanical fall: S/P  left hip hemiarthroplasty; POD 2 - Pain management   - Cont IV hydration  - PT/OT per Ortho -rec. 24 hr supervision,home health. -Palliative care consulted, will f/u -speech swallow consult  2. UTI with subsequent sepsis:  With leukocytosis and fever On IV Zosyn since there was thought of UTI and aspiration pneumonia -Urine culture grew E. Coli -Chest x-ray from 2/2 did not reveal aspiration Will f/u Bcultures Will continue on zosyn for now , d/c vanco. As mrsa pcr neg. Until final cultures come back.   3. Hypertensive urgency: Resolved now  - Cont  IV labetalol PRN - continue amlodipine.  4. Acute kidney injury superimposed on stage IIIb chronic kidney disease.-- Improving following hydration  - Cont IV normal  - Follow BMP - Avoid potential nephrotoxins  5. Hypothyroidism. -- TSH WNL  - Continue home Synthroid.   6.Nutrition- has poor intake Speech swallow evaluation consult -Continue Essential  medication to IV for now; continue to hold p.o. medications  7.  hypernatremia-mild.  Possibly due to hypovolemia.  Continue IV fluids monitor labs for now  DVT prophylaxis: Heparin  code Status:DNR Family Communication: none at bedside Disposition Plan: Will return back to residence home once afebrile about 24hrs, will need f/u cultures prior to d/c. Speech swallow consulted      LOS: 3 days   Time spent: 45 min with more than 50% on coc    Lynn Ito, MD [facility] Pager [phone]  If 7PM-7AM, please contact night-coverage [email or web address] Password [facility] 02/23/2019, 2:27 PM

## 2019-02-23 NOTE — Care Management Important Message (Signed)
Important Message  Patient Details  Name: Sabrina Burke MRN: [id number] Date of Birth: Jun 17, 1927   Medicare Important Message Given:  N/A - LOS <3 / Initial given by admissions     [name] A Shamirah Ivan 02/23/2019, 8:30 AM

## 2019-02-23 NOTE — Progress Notes (Signed)
Patient is currently followed by [employer] [facility] program at [facility]. TOC Becky Dupree made aware. Dayna Barker BSN, RN, [facility] [employer] [phone]

## 2019-02-23 NOTE — Consult Note (Signed)
Consultation Note Date: 02/23/2019   Patient Name: Sabrina Burke  DOB: May 11, 1927  MRN: [id number]  Age / Sex: 86 y.o., female   PCP: Odessa Fleming, NP Referring Physician: Nolberto Hanlon, MD   REASON FOR CONSULTATION:Establishing goals of care  Palliative Care consult requested for goals of care discussion in this 86 y.o. female with multiple medical problems including dementia and hypertension. Patient presented to ED after a mechanical fall (several days prior) at [place] and complaining of left hip pain.  During ED work-up x-ray showed Left subcapital femoral neck fracture. CT of head and spine showed chronic atrophic and ischemic changes without acute abnormality and  multilevel degenerative change without acute Abnormality. COVID-19 PCR. UA was positive. She was started on IV antibiotics. Patient was evaluated and underwent a left hip hemiarthroplasty per Orthopedics. Post surgery patient has become increasingly more confused, febrile (101.9), and dysphasic. SLP evaluating and offering recommendations of NPO at this time due to poor oral motor/oral holding.    Clinical Assessment and Goals of Care: I have reviewed medical records including lab results, imaging, Epic notes, and MAR, received report from the bedside RN, and assessed the patient. I met at the bedside with patient's daughter/POA, Einar Crow  to discuss diagnosis prognosis, GOC, EOL wishes, disposition and options.   Alert to self only. Denies shortness of breath or pain. States "I am just tired"  I observed patient today working with PT. She complained of some left knee discomfort at that time and stated she "felt like shit!" She would follow commands but not with consistency. Alert to self. I also observed patient working with SLP. Patient noted to have delayed swallowing requiring prompting and/or stimulation at times.   I introduced Palliative Medicine as specialized medical care for people living with  serious illness. It focuses on providing relief from the symptoms and stress of a serious illness. The goal is to improve quality of life for both the patient and the family. Daughter verbalized understanding and appreciation of our involvement.   We discussed a brief life review of the patient, along with her functional and nutritional status. Daughter reports patient has been a resident at [facility] since August 2019. Prior to that she resided in [place] alone and with family. She is a retired Therapist, sports. She has 4 children (with several living out of state).   Daughter reports patient began showing signs of dementia almost 4 years ago with some mild memory loss and bizarre behaviors. Patient was originally placed in a ALF in [place] but had a fall and has been wheelchair bound since (2019). Daughter reports she has not seen her mother due to [place] visitation restrictions over the past year except when admitted into the hospital. She endorses a noticeable change in her cognition and mental status over the past 6-9 months. Patient no longer recognizes any of her children or family and has become somewhat resistant to some care. Daughter reports no known complications regarding her mother's appetite.   We discussed Her current illness and what it means in the larger context of Her on-going co-morbidities. With specific discussions regarding dementia progression, UTI, and overall functional and nutritional decline. Natural disease trajectory and expectations at EOL were discussed.  Daughter reports she was surprised after speaking with staff at Memory care and being aware that her mother's health had shown some decline over the past months. She shares she was not aware that her mother now required 2 person max assistance and  had a decrease in appetite at the facility. She does endorse awareness that patient had lost 5lbs over 1-2 months but was also hospitalized with a UTI.   Detailed discussion  regarding patient's overall nutrition and function. Daughter reports her mother doesn't understand that she cannot walk and when she see's a walker or cane she thinks she can and that is what most likely facilitated her fall. We discussed findings from SLP evaluation and PT evaluation. Daughter verbalized understanding. She express feelings that she felt her mother needed a SNF placement for long-term care and also rehab. I educated daughter at length on the goal of rehab at [facility] and the process of patient being placed in long-term care. She verbalized understanding and expressed thoughts that her mother would never be ambulatory again given she has been wheelchair bound for several years. I explained patient's pre-admission baseline and current state.   We discussed artificial feedings and hydration in detail in the setting of advanced age and dementia. Daughter verbalized understanding. We also discussed patient's documented advanced directive. Daughter shared she plans to honor her mother's wishes and would not want artificial feedings/PEG placement.   I attempted to elicit values and goals of care important to the patient.    The difference between aggressive medical intervention and comfort care was considered in light of the patient's goals of care. Daughter shares that patient has been seen by outpatient Palliative ([facility] back in December and in January) at the request of the medical team at the facility. She shares that the NP in January recommended hospice care after further discussion with the medical doctor. Daughter reports she declined at that time as she was not ready to accept this. She reports her goal is for her mother to be comfortable but also to continue to live and thrive as much as she can. She also was not interested in losing some of the resources such as PT/OT assistance by transitioning patient to hospice. Daughter states "I know where she is headed and that she will get worst  and eventually pass away. I am accepting of this!"   Hospice and Palliative Care services outpatient were explained in further details. Daughter verbalized her understanding and awareness of both palliative and hospice's goals and philosophy of care. She is declining hospice at this time. She is in agreement to continue with outpatient Palliative support.   Advanced directives, concepts specific to code status, artifical feeding and hydration, and rehospitalization were considered and discussed. Patient does have a documented advanced directive. I have reviewed her documentation which is on file in [place]. Patient's documented wishes are for artificial hydration only no artificial nutrition, to withold or withraw any life-prolonging measures, comfort care in the setting of severe dementia , unconscious, or terminal disease. Patient's AD indicates cannot be overrode by [facility] which is named as her daughter Adenike Shidler. Daughter confirms DNR/DNI.   Questions and concerns were addressed.  Hard Choices booklet left for review. The family was encouraged to call with questions or concerns.  PMT will continue to support holistically.   SOCIAL HISTORY:     reports that she has never smoked. She has never used smokeless tobacco. She reports previous alcohol use. She reports previous drug use.  CODE STATUS: DNR  ADVANCE DIRECTIVES: Einar Crow (POA)  SYMPTOM MANAGEMENT: per attending   Palliative Prophylaxis:   Aspiration, Bowel Regimen, Delirium Protocol, Eye Care, Frequent Pain Assessment and Oral Care  PSYCHO-SOCIAL/SPIRITUAL:  Support System: Family  Desire for further Chaplaincy support:  NO   Additional Recommendations (Limitations, Scope, Preferences):  No Artificial Feeding, continue to treat, DNR/DNI.    PAST MEDICAL HISTORY: Past Medical History:  Diagnosis Date  . Dementia ([place])   . Hypertension     PAST SURGICAL HISTORY:  Past Surgical History:  Procedure Laterality Date  .  HIP ARTHROPLASTY Left 02/21/2019   Procedure: LEFT HIP HEMIARTHROPLASTY;  Surgeon: Thornton Park, MD;  Location: [facility] ORS;  Service: Orthopedics;  Laterality: Left;    ALLERGIES:  has No Known Allergies.   MEDICATIONS:  Current Facility-Administered Medications  Medication Dose Route Frequency Provider Last Rate Last Admin  . acetaminophen (TYLENOL) suppository 650 mg  650 mg Rectal Q6H PRN Thornell Mule, MD   650 mg at 02/22/19 1810  . acetaminophen (TYLENOL) tablet 325-650 mg  325-650 mg Oral Q6H PRN Thornton Park, MD      . alum & mag hydroxide-simeth (MAALOX/MYLANTA) 200-200-20 MG/5ML suspension 30 mL  30 mL Oral PRN Thornton Park, MD      . bisacodyl (DULCOLAX) suppository 10 mg  10 mg Rectal Daily PRN Thornton Park, MD      . Chlorhexidine Gluconate Cloth 2 % PADS 6 each  6 each Topical Daily Thornton Park, MD   6 each at 02/22/19 [phone]  . cholecalciferol (VITAMIN D3) tablet 2,000 Units  2,000 Units Oral Daily Thornton Park, MD   2,000 Units at 02/22/19 [phone]  . dextrose 5 %-0.45 % sodium chloride infusion   Intravenous Continuous Thornell Mule, MD 100 mL/hr at 02/23/19 0241 New Bag at 02/23/19 0241  . docusate sodium (COLACE) capsule 100 mg  100 mg Oral BID Thornton Park, MD   100 mg at 02/22/19 0829  . ferrous sulfate tablet 325 mg  325 mg Oral TID PC Thornton Park, MD   325 mg at 02/22/19 7588  . heparin injection 5,000 Units  5,000 Units Subcutaneous Q8H Nolberto Hanlon, MD      . HYDROcodone-acetaminophen (NORCO) 7.5-325 MG per tablet 1-2 tablet  1-2 tablet Oral Q4H PRN Thornton Park, MD      . HYDROcodone-acetaminophen (NORCO/VICODIN) 5-325 MG per tablet 1-2 tablet  1-2 tablet Oral Q4H PRN Thornton Park, MD      . labetalol (NORMODYNE) injection 20 mg  20 mg Intravenous Q3H PRN Thornton Park, MD      . levothyroxine (SYNTHROID, LEVOTHROID) injection 88 mcg  88 mcg Intravenous Daily Thornell Mule, MD   88 mcg at 02/23/19 0930  . loperamide (IMODIUM)  capsule 2 mg  2 mg Oral PRN Thornton Park, MD      . LORazepam (ATIVAN) tablet 0.25 mg  0.25 mg Oral BID PRN Thornton Park, MD      . LORazepam (ATIVAN) tablet 0.25 mg  0.25 mg Oral BID Thornton Park, MD   0.25 mg at 02/21/19 2147  . magnesium citrate solution 1 Bottle  1 Bottle Oral Once PRN Thornton Park, MD      . menthol-cetylpyridinium (CEPACOL) lozenge 3 mg  1 lozenge Oral PRN Thornton Park, MD       Or  . phenol (CHLORASEPTIC) mouth spray 1 spray  1 spray Mouth/Throat PRN Thornton Park, MD      . methocarbamol (ROBAXIN) tablet 500 mg  500 mg Oral Q6H PRN Thornton Park, MD       Or  . methocarbamol (ROBAXIN) 500 mg in dextrose 5 % 50 mL IVPB  500 mg Intravenous Q6H PRN Thornton Park, MD      . metoprolol tartrate (LOPRESSOR) injection 2.5 mg  2.5 mg Intravenous Q12H Thornell Mule, MD   2.5 mg at 02/23/19 0942  . morphine 4 MG/ML injection 0.52-1 mg  0.52-1 mg Intravenous Q2H PRN Thornton Park, MD      . multivitamin with minerals tablet   Oral Daily Thornton Park, MD   1 tablet at 02/22/19 [phone]  . ondansetron (ZOFRAN) tablet 4 mg  4 mg Oral Q6H PRN Thornton Park, MD       Or  . ondansetron [facility]) injection 4 mg  4 mg Intravenous Q6H PRN Thornton Park, MD      . piperacillin-tazobactam (ZOSYN) IVPB 3.375 g  3.375 g Intravenous Q12H Benita Gutter, RPH 12.5 mL/hr at 02/23/19 0938 3.375 g at 02/23/19 0938  . polyethylene glycol (MIRALAX / GLYCOLAX) packet 17 g  17 g Oral Daily PRN Thornton Park, MD      . senna [facility]) tablet 8.6 mg  1 tablet Oral BID Thornton Park, MD   8.6 mg at 02/22/19 0829  . traMADol (ULTRAM) tablet 50 mg  50 mg Oral Q6H Thornton Park, MD   50 mg at 02/22/19 1131  . traZODone (DESYREL) tablet 25 mg  25 mg Oral QHS PRN Thornton Park, MD        VITAL SIGNS: BP (!) 157/60 (BP Location: Right Arm)   Pulse 79   Temp 98.2 F (36.8 C) (Oral)   Resp 11   Ht 5' 2"  (1.575 m)   Wt 45.4 kg   SpO2 95%   BMI 18.29  kg/m  Filed Weights   02/20/19 1951  Weight: 45.4 kg    Estimated body mass index is 18.29 kg/m as calculated from the following:   Height as of this encounter: 5' 2"  (1.575 m).   Weight as of this encounter: 45.4 kg.  LABS: CBC:    Component Value Date/Time   WBC 9.7 02/23/2019 0553   HGB 8.1 (L) 02/23/2019 0553   HCT 26.4 (L) 02/23/2019 0553   PLT 218 02/23/2019 0553   Comprehensive Metabolic Panel:    Component Value Date/Time   NA 149 (H) 02/23/2019 0553   K 3.6 02/23/2019 0553   CO2 27 02/23/2019 0553   BUN 39 (H) 02/23/2019 0553   CREATININE 2.17 (H) 02/23/2019 0553   ALBUMIN 2.9 (L) 02/20/2019 2053     Review of Systems  Unable to perform ROS: Dementia   Physical Exam General: NAD, frail chronically-ill appearing, thin Cardiovascular: regular rate and rhythm Pulmonary: diminished bilaterally Abdomen: soft, nontender, + bowel sounds Extremities: Left hip surgical site, moves all extremities,  Skin: no rashes, thin, warm, dry Neurological: alert to self x1, follows some commands but not consistently   Prognosis: Guarded  Discharge Planning:  [place] with continued outpatient palliative.   Recommendations:  DNR/DNI-as confirmed by daughter (Patient does have AD located in [place])  Continue with current plan of care per medical team  Daughter reports patient is currently being followed by outpatient Palliative ([facility]) and was last seen on 01/28/19. She reports they recommended hospice however she declined and is not interested at this time either.   Discussed in detail patient's overall condition, functional, and nutritional state. Daughter continues to remain hopeful for some stability in her mother with awareness that her condition is and will further decline. She reports awareness and plans to continue to take it day by day transitioning to hospice when she feels it is most appropriate for the family.   Continue with outpatient palliative  support.   PMT will  continue to support and follow.   Palliative Performance Scale: PPS 20-30%              Daughter expressed understanding and was in agreement with this plan.   Thank you for allowing the Palliative Medicine Team to assist in the care of this patient.  Time In: 1400 Time Out: 1515 Time Total: 75 min.   Visit consisted of counseling and education dealing with the complex and emotionally intense issues of symptom management and palliative care in the setting of serious and potentially life-threatening illness.Greater than 50%  of this time was spent counseling and coordinating care related to the above assessment and plan.  Signed by:  Alda Lea, AGPCNP-BC Palliative Medicine Team  Phone: [phone] Fax: [phone] Pager: [phone] Amion: Bjorn Pippin

## 2019-02-24 DIAGNOSIS — E039 Hypothyroidism, unspecified: Secondary | ICD-10-CM

## 2019-02-24 DIAGNOSIS — I10 Essential (primary) hypertension: Secondary | ICD-10-CM

## 2019-02-24 LAB — BASIC METABOLIC PANEL
Anion gap: 8 (ref 5–15)
BUN: 39 mg/dL — ABNORMAL HIGH (ref 8–23)
CO2: 26 mmol/L (ref 22–32)
Calcium: 8.4 mg/dL — ABNORMAL LOW (ref 8.9–10.3)
Chloride: 113 mmol/L — ABNORMAL HIGH (ref 98–111)
Creatinine, Ser: 2.2 mg/dL — ABNORMAL HIGH (ref 0.44–1.00)
GFR calc Af Amer: 22 mL/min — ABNORMAL LOW (ref >60)
GFR calc non Af Amer: 19 mL/min — ABNORMAL LOW (ref >60)
Glucose, Bld: 169 mg/dL — ABNORMAL HIGH (ref 70–99)
Potassium: 3.7 mmol/L (ref 3.5–5.1)
Sodium: 147 mmol/L — ABNORMAL HIGH (ref 135–145)

## 2019-02-24 LAB — CBC
HCT: 26.3 % — ABNORMAL LOW (ref 36.0–46.0)
Hemoglobin: 8.1 g/dL — ABNORMAL LOW (ref 12.0–15.0)
MCH: 32.4 pg (ref 26.0–34.0)
MCHC: 30.8 g/dL (ref 30.0–36.0)
MCV: 105.2 fL — ABNORMAL HIGH (ref 80.0–100.0)
Platelets: 243 10*3/uL (ref 150–400)
RBC: 2.5 MIL/uL — ABNORMAL LOW (ref 3.87–5.11)
RDW: 12.1 % (ref 11.5–15.5)
WBC: 7.8 10*3/uL (ref 4.0–10.5)
nRBC: 0 % (ref 0.0–0.2)

## 2019-02-24 MED ORDER — LEVOTHYROXINE SODIUM 88 MCG PO TABS
88.0000 ug | ORAL_TABLET | Freq: Every day | ORAL | Status: DC
  Administered 2019-02-25 – 2019-03-03 (×6): 88 ug via ORAL
  Filled 2019-02-24 (×7): qty 1

## 2019-02-24 MED ORDER — METOPROLOL TARTRATE 25 MG PO TABS
12.5000 mg | ORAL_TABLET | Freq: Two times a day (BID) | ORAL | Status: DC
  Administered 2019-02-24 – 2019-03-01 (×9): 12.5 mg via ORAL
  Filled 2019-02-24 (×9): qty 1

## 2019-02-24 NOTE — Progress Notes (Signed)
PROGRESS NOTE    Sabrina Burke  MKL:[id number] DOB: Apr 23, 1927 DOA: 02/20/2019 PCP: Devoria Glassing, NP    Brief Narrative:  Sabrina Burke  is a 86 y.o. pleasantly demented Caucasian female with a known history of dementia and hypertension, who presented to the emergency room with acute onset of left hip pain after having a mechanical fall at [facility].    Consultants:   Ortho  Procedures:  S/P left hip hemiarthroplasty on 02/21/19  Antimicrobials:   Zosyn   Subjective: Pt tries to interact but still baseline confused. Has no complaints Objective: Vitals:   02/23/19 1620 02/23/19 1738 02/23/19 2313 02/24/19 0754  BP: (!) 169/58 (!) 119/40 (!) 141/48 138/66  Pulse: 87 78 66 63  Resp: 12 17 16 17   Temp: 99.2 F (37.3 C) 98.2 F (36.8 C) 97.7 F (36.5 C) 98 F (36.7 C)  TempSrc: Oral Oral Oral Oral  SpO2: 93%  97% 96%  Weight:      Height:        Intake/Output Summary (Last 24 hours) at 02/24/2019 0811 Last data filed at 02/23/2019 1851 Gross per 24 hour  Intake --  Output 450 ml  Net -450 ml   Filed Weights   02/20/19 1951  Weight: 45.4 kg    Examination:  General exam: Appears calm and comfortable , nad Respiratory system: Clear to auscultation.  No w/r/r Cardiovascular system: S1 & S2 heard, RRR. No  murmurs, rubs, gallops or clicks.  Gastrointestinal system: Abdomen is nondistended, soft and nontender.  Normal bowel sounds heard. Central nervous system: awake, not oriented at all, confused, pleasant Extremities:no edema, LLE surgical wrap Skin: warm, dry Psychiatry: unable to assess.     Data Reviewed: I have personally reviewed following labs and imaging studies  CBC: Recent Labs  Lab 02/20/19 2053 02/21/19 0442 02/22/19 0419 02/23/19 0553 02/24/19 0459  WBC 13.2* 10.7* 8.9 9.7 7.8  NEUTROABS 9.4*  --   --   --   --   HGB 10.3* 9.7* 8.6* 8.1* 8.1*  HCT 32.0* 31.1* 26.9* 26.4* 26.3*  MCV 101.3* 103.3* 103.1*  106.0* 105.2*  PLT 266 196 205 218 243   Basic Metabolic Panel: Recent Labs  Lab 02/20/19 2053 02/21/19 0442 02/22/19 0419 02/23/19 0553 02/24/19 0459  NA 145 150* 147* 149* 147*  K 4.0 3.9 3.5 3.6 3.7  CL 109 114* 114* 115* 113*  CO2 26 23 24 27 26   GLUCOSE 154* 120* 184* 150* 169*  BUN 50* 49* 36* 39* 39*  CREATININE 2.01* 1.75* 1.63* 2.17* 2.20*  CALCIUM 8.9 8.5* 8.2* 8.3* 8.4*   GFR: Estimated Creatinine Clearance: 12.4 mL/min (A) (by C-G formula based on SCr of 2.2 mg/dL (H)). Liver Function Tests: Recent Labs  Lab 02/20/19 2053  AST 27  ALT 22  ALKPHOS 77  BILITOT 0.6  PROT 7.2  ALBUMIN 2.9*   No results for input(s): LIPASE, AMYLASE in the last 168 hours. No results for input(s): AMMONIA in the last 168 hours. Coagulation Profile: Recent Labs  Lab 02/20/19 2053  INR 1.1   Cardiac Enzymes: No results for input(s): CKTOTAL, CKMB, CKMBINDEX, TROPONINI in the last 168 hours. BNP (last 3 results) No results for input(s): PROBNP in the last 8760 hours. HbA1C: No results for input(s): HGBA1C in the last 72 hours. CBG: Recent Labs  Lab 02/20/19 1955  GLUCAP 151*   Lipid Profile: No results for input(s): CHOL, HDL, LDLCALC, TRIG, CHOLHDL, LDLDIRECT in the last 72 hours. Thyroid  Function Tests: No results for input(s): TSH, T4TOTAL, FREET4, T3FREE, THYROIDAB in the last 72 hours. Anemia Panel: No results for input(s): VITAMINB12, FOLATE, FERRITIN, TIBC, IRON, RETICCTPCT in the last 72 hours. Sepsis Labs: Recent Labs  Lab 02/20/19 2053  LATICACIDVEN 1.1    Recent Results (from the past 240 hour(s))  Respiratory Panel by RT PCR (Flu A&B, Covid) - Nasopharyngeal Swab     Status: None   Collection Time: 02/20/19  8:36 PM   Specimen: Nasopharyngeal Swab  Result Value Ref Range Status   SARS Coronavirus 2 by RT PCR NEGATIVE NEGATIVE Final    Comment: (NOTE) SARS-CoV-2 target nucleic acids are NOT DETECTED. The SARS-CoV-2 RNA is generally detectable in  upper respiratoy specimens during the acute phase of infection. The lowest concentration of SARS-CoV-2 viral copies this assay can detect is 131 copies/mL. A negative result does not preclude SARS-Cov-2 infection and should not be used as the sole basis for treatment or other patient management decisions. A negative result may occur with  improper specimen collection/handling, submission of specimen other than nasopharyngeal swab, presence of viral mutation(s) within the areas targeted by this assay, and inadequate number of viral copies (<131 copies/mL). A negative result must be combined with clinical observations, patient history, and epidemiological information. The expected result is Negative. Fact Sheet for Patients:  https://www.moore.com/ Fact Sheet for Healthcare Providers:  https://www.young.biz/ This test is not yet ap proved or cleared by the [place] FDA and  has been authorized for detection and/or diagnosis of SARS-CoV-2 by FDA under an Emergency Use Authorization (EUA). This EUA will remain  in effect (meaning this test can be used) for the duration of the COVID-19 declaration under Section 564(b)(1) of the Act, 21 U.S.C. section 360bbb-3(b)(1), unless the authorization is terminated or revoked sooner.    Influenza A by PCR NEGATIVE NEGATIVE Final   Influenza B by PCR NEGATIVE NEGATIVE Final    Comment: (NOTE) The Xpert Xpress SARS-CoV-2/FLU/RSV assay is intended as an aid in  the diagnosis of influenza from Nasopharyngeal swab specimens and  should not be used as a sole basis for treatment. Nasal washings and  aspirates are unacceptable for Xpert Xpress SARS-CoV-2/FLU/RSV  testing. Fact Sheet for Patients: https://www.moore.com/ Fact Sheet for Healthcare Providers: https://www.young.biz/ This test is not yet approved or cleared by the [place] FDA and  has been authorized for  detection and/or diagnosis of SARS-CoV-2 by  FDA under an Emergency Use Authorization (EUA). This EUA will remain  in effect (meaning this test can be used) for the duration of the  Covid-19 declaration under Section 564(b)(1) of the Act, 21  U.S.C. section 360bbb-3(b)(1), unless the authorization is  terminated or revoked. Performed at [facility], [address] [place], [place] [address]   Blood culture (routine x 2)     Status: None (Preliminary result)   Collection Time: 02/20/19  8:53 PM   Specimen: BLOOD  Result Value Ref Range Status   Specimen Description BLOOD RIGHT ANTECUBITAL  Final   Special Requests   Final    BOTTLES DRAWN AEROBIC AND ANAEROBIC Blood Culture results may not be optimal due to an inadequate volume of blood received in culture bottles   Culture   Final    NO GROWTH 4 DAYS Performed at [facility], [address] [place], [place] [address]    Report Status PENDING  Incomplete  Blood culture (routine x 2)     Status: None (Preliminary result)   Collection Time:  02/20/19  8:53 PM   Specimen: BLOOD  Result Value Ref Range Status   Specimen Description BLOOD BLOOD LEFT HAND  Final   Special Requests   Final    BOTTLES DRAWN AEROBIC AND ANAEROBIC Blood Culture adequate volume   Culture   Final    NO GROWTH 4 DAYS Performed at [facility], [address] [place], [place] [address]    Report Status PENDING  Incomplete  Urine Culture     Status: Abnormal   Collection Time: 02/20/19 11:44 PM   Specimen: Urine, Clean Catch  Result Value Ref Range Status   Specimen Description   Final    URINE, CLEAN CATCH Performed at [facility], [address] [place], [place] [address]    Special Requests   Final    NONE Performed at [facility], [address] [place], [place] [address]    Culture >=100,000 COLONIES/mL ESCHERICHIA COLI (A)  Final   Report Status 02/22/2019 FINAL  Final   Organism ID,  Bacteria ESCHERICHIA COLI (A)  Final      Susceptibility   Escherichia coli - MIC*    AMPICILLIN 8 SENSITIVE Sensitive     CEFAZOLIN <=4 SENSITIVE Sensitive     CEFTRIAXONE <=0.25 SENSITIVE Sensitive     CIPROFLOXACIN <=0.25 SENSITIVE Sensitive     GENTAMICIN <=1 SENSITIVE Sensitive     IMIPENEM <=0.25 SENSITIVE Sensitive     NITROFURANTOIN <=16 SENSITIVE Sensitive     TRIMETH/SULFA <=20 SENSITIVE Sensitive     AMPICILLIN/SULBACTAM <=2 SENSITIVE Sensitive     PIP/TAZO <=4 SENSITIVE Sensitive     * >=100,000 COLONIES/mL ESCHERICHIA COLI  Surgical pcr screen     Status: None   Collection Time: 02/21/19 12:50 AM   Specimen: Nasal Mucosa; Nasal Swab  Result Value Ref Range Status   MRSA, PCR NEGATIVE NEGATIVE Final   Staphylococcus aureus NEGATIVE NEGATIVE Final    Comment: (NOTE) The Xpert SA Assay (FDA approved for NASAL specimens in patients 18 years of age and older), is one component of a comprehensive surveillance program. It is not intended to diagnose infection nor to guide or monitor treatment. Performed at [facility], [address] [place], [place] [address]   CULTURE, BLOOD (ROUTINE X 2) w Reflex to ID Panel     Status: None (Preliminary result)   Collection Time: 02/22/19  1:02 PM   Specimen: BLOOD  Result Value Ref Range Status   Specimen Description BLOOD BLOOD RIGHT HAND  Final   Special Requests   Final    BOTTLES DRAWN AEROBIC AND ANAEROBIC Blood Culture results may not be optimal due to an inadequate volume of blood received in culture bottles   Culture   Final    NO GROWTH 2 DAYS Performed at [facility] [address] [place], [place] [address]    Report Status PENDING  Incomplete  CULTURE, BLOOD (ROUTINE X 2) w Reflex to ID Panel     Status: None (Preliminary result)   Collection Time: 02/22/19  1:08 PM   Specimen: BLOOD  Result Value Ref Range Status   Specimen Description BLOOD RIGHT ANTECUBITAL  Final   Special Requests    Final    BOTTLES DRAWN AEROBIC AND ANAEROBIC Blood Culture adequate volume   Culture   Final    NO GROWTH 2 DAYS Performed at [facility], [address] [place], [place] [address]    Report Status PENDING  Incomplete  Radiology Studies: DG CHEST PORT 1 VIEW  Result Date: 02/22/2019 CLINICAL DATA:  Fever. EXAM: PORTABLE CHEST 1 VIEW COMPARISON:  02/20/2019. FINDINGS: Mediastinum and hilar structures normal. Mild bibasilar atelectasis. No focal alveolar infiltrate. No pleural effusion or pneumothorax. Stable elevation right hemidiaphragm. Degenerative change thoracic spine. Degenerative changes both shoulders. IMPRESSION: Mild bibasilar atelectasis. Stable elevation right hemidiaphragm. Chest is stable from prior exam. Electronically Signed   By: Maisie Fus  Register   On: 02/22/2019 13:59        Scheduled Meds: . Chlorhexidine Gluconate Cloth  6 each Topical Daily  . cholecalciferol  2,000 Units Oral Daily  . docusate sodium  100 mg Oral BID  . ferrous sulfate  325 mg Oral TID PC  . heparin injection (subcutaneous)  5,000 Units Subcutaneous Q8H  . levothyroxine  88 mcg Intravenous Daily  . LORazepam  0.25 mg Oral BID  . metoprolol tartrate  2.5 mg Intravenous Q12H  . multivitamin with minerals   Oral Daily  . senna  1 tablet Oral BID  . traMADol  50 mg Oral Q6H   Continuous Infusions: . dextrose 5 % and 0.45% NaCl 100 mL/hr at 02/23/19 1651  . methocarbamol (ROBAXIN) IV    . piperacillin-tazobactam (ZOSYN)  IV 3.375 g (02/23/19 1959)    Assessment & Plan:   Active Problems:   Closed left hip fracture (HCC)   Fever   Protein-calorie malnutrition, severe   1. Left hip fracture- secondary to mechanical fall: S/P  left hip hemiarthroplasty; POD 3 - Pain management   - Cont IV hydration  - PT/OT per Ortho -rec. 24 hr supervision,home health. -Palliative care consulted, will f/u -speech swallow consult  2. UTI with subsequent sepsis:  With  leukocytosis and fever On IV Zosyn since there was thought of UTI and aspiration pneumonia -Urine culture grew E. Coli -Chest x-ray from 2/2 did not reveal aspiration Will f/u Bcultures Will continue on zosyn for now , d/c vanco. As mrsa pcr neg. Until final cultures come back.   3. Hypertensive urgency: Resolved now  - switch iv beta blk to po - continue amlodipine.  4. Acute kidney injury superimposed on stage IIIb chronic kidney disease.-- Improving following hydration  - Cont IV normal  - creatinine up but hopefully stabilizing - Avoid potential nephrotoxins  5. Hypothyroidism. -- TSH WNL  - Continue home Synthroid, switch from iv to po   6.Nutrition- has poor intake Speech swallow evaluation consult -Continue Essential  medication to IV for now; continue to hold p.o. medications  7.  hypernatremia-mild.  Possibly due to hypovolemia.  Continue IV fluids monitor labs for now  DVT prophylaxis: Heparin  code Status:DNR Family Communication: none at bedside Disposition Plan: Will return back to residence home once afebrile about 24hrs, ck bmp, if stablizing creatinine, possible d/c in am. Needs covid prior to dc/     LOS: 4 days   Time spent: 45 min with more than 50% on coc    Lynn Ito, MD [facility] Pager [phone]  If 7PM-7AM, please contact night-coverage [email or web address] Password [email or web address] 02/24/2019, 8:11 AM Patient ID: Sabrina Burke, female   DOB: [birth year], 86 y.o.   MRN: [id number]

## 2019-02-24 NOTE — Progress Notes (Signed)
Speech Language Pathology Treatment: Dysphagia  Patient Details Name: Sabrina Burke MRN: [id number] DOB: 01/18/1928 Today's Date: 02/24/2019 Time: 8182-9937 SLP Time Calculation (min) (ACUTE ONLY): 40 min  Assessment / Plan / Recommendation Clinical Impression  Pt seen today for ongoing assessment of swallowing function in hopes to establish an oral diet. Pt exhibited reduced alertness at BSE initially. Pt continues to exhibit increased alertness and engagement w/ staff. She continues to present w/ significant Cognitive decline; baseline Dementia. Oral care given recently per NSG. Pt required full assistance to sit midline w/ head forward. Pt has min Kyphotic head position. Pt exhibited increased attention to task during this assessment today; more verbal but did not follow any instructions. Pt exhibited increased oropharyngeal awareness for engagement in po tasks. She accepted po trials especially sips of Nectar Milk via tsp/Cup/Straw by opening mouth w/ light stim at lips. She consumed ~5ozs total. Noted grossly adequate oral phase bolus prep and management, then A-P transfer for swallowing. Min increased oral holding/delay in A-P transfer of puree bolus trials. Pt stated she "just didn't want it right now". No immediate, overt clinical s/s of aspiration noted; pharyngeal swallows appeared grossly adequate and complete to clear bolus material - f/u swallows noted intermittently. Excursion was grossly adequate. Oral holding noted moreso w/ puree trials but given time and tactile/verbal encouragement, she appeared to clear fully. Oral phase was supported by alternating foods/liquids.   Recommend continue a Dysphagia level 1(puree) w/ Nectar liquids via Cup/Straw; strict aspiration precautions and feeding support checking for oral clearing w/ all trials given. Pills given Crushed in Puree. Hold any po's if pt is not following through w/ swallowing and oral clearing. Suspect pt's Baseline Dementia impacted  by Acute illness may be interfering w/ pt's ability for full oral awareness and engagement of tasks of swallowing as well as her overall engagement. Pt is at risk for inability to meet nutrition/hydration needs orally. ST services will f/u w/ pt's status while admitted. Recommend f/u w/ Dietician and Palliative Care for [place]. NSG updated. Precautions posted.    HPI HPI: Pt is a 86 y.o. female with a known history of Dementia and hypertension, who presented to the emergency room with acute onset of left hip pain after having a mechanical fall at [facility] assisted living facility.  Apparently this happened several days ago and she complained of pain until today when she was moved.  She is nonverbal and therefore no history could be obtained from her.  She was sent to the ER to have x-ray of the left hip which showed left subcapital hip fracture.  Pt is POD #1 s/p left hip hemiarthroplasty Orthopedic noted.       SLP Plan  Continue with current plan of care       Recommendations  Diet recommendations: Dysphagia 1 (puree);Nectar-thick liquid Liquids provided via: Cup;Straw(monitor) Medication Administration: Crushed with puree(for safer swallowing) Supervision: Staff to assist with self feeding;Full supervision/cueing for compensatory strategies Compensations: Minimize environmental distractions;Slow rate;Small sips/bites;Lingual sweep for clearance of pocketing;Multiple dry swallows after each bite/sip;Follow solids with liquid Postural Changes and/or Swallow Maneuvers: Seated upright 90 degrees;Upright 30-60 min after meal                General recommendations: (Dietician and Palliative Care f/u) Oral Care Recommendations: Oral care QID;Staff/trained caregiver to provide oral care;Oral care before and after PO Follow up Recommendations: Skilled Nursing facility(TBD) SLP Visit Diagnosis: Dysphagia, oropharyngeal phase (R13.12)(baseline Dementia) Plan: Continue with current plan of  care  GO                 Jerilynn Som, MS, CCC-SLP Jacques Fife 02/24/2019, 4:09 PM

## 2019-02-24 NOTE — TOC Progression Note (Signed)
Transition of Care (TOC) - Progression Note    Patient Details  Name: Sabrina Burke MRN: [id number] Date of Birth: 02/21/1927  Transition of Care (TOC) CM/SW Contact  ,  G, LCSW Phone Number: 02/24/2019, 3:30 PM  Clinical Narrative:  Met with daughter-Lea to discuss plan for Mom. She is aware PT recommended going back to [facility] where she was living prior to admission with palliative follow and [facility] who was following prior to admission. Daughter feels a hospital bed for help pt with her mobility, safety and for her hip to heal. Informed her will contact MD and PT regarding this recommendation. Will also contact Brandy at [facility] regarding ability to get hospital bed for pt's room there. Pt will need another COVID test prior to returning. Daughter does not want pt to go facility over the weekend due to short staffed and made aware it is not my call when she will return to facility, but up to the MD. Will work on discharge plans.    Expected Discharge Plan: Skilled Nursing Facility Barriers to Discharge: Continued Medical Work up  Expected Discharge Plan and Services Expected Discharge Plan: Skilled Nursing Facility In-house Referral: Clinical Social Work Discharge Planning Services: NA   Living arrangements for the past 2 months: Assisted Living Facility([facility])                                       Social Determinants of Health (SDOH) Interventions    Readmission Risk Interventions No flowsheet data found.  

## 2019-02-24 NOTE — Progress Notes (Cosign Needed)
   Diagnosis code: S72.002A & A41.9  Height: 5'3  Weight: 100 lbs   Patient has  which requires her body  to be positioned in ways not feasible with a normal bed.  She also has dysphagia wich requires her head of bed to be elevated  30 degrees after eating. Her lower extremities require frequent and immediate changes in body position which cannot be achieved with a normal bed.

## 2019-02-24 NOTE — TOC Progression Note (Signed)
Transition of Care [facility]) - Progression Note    Patient Details  Name: Monique Hefty MRN: [id number] Date of Birth: 1927-08-19  Transition of Care [facility]) CM/SW Contact  Lachlan Pelto, Lemar Livings, LCSW Phone Number: 02/24/2019, 3:55 PM  Clinical Narrative: Just spoke with Brandy-Admissions person at [facility] who reports [place] needs to evaluate pt before returning back to their facility. Have asked to fax information or if she wanted to come in an evaluate her. They will get her a hospital bed if MD orders and MD has agreed to order one for her. The facility is moving around residents due to COVID outbreak. MD aware will continue to work on discharge plan.       Expected Discharge Plan: Skilled Nursing Facility Barriers to Discharge: Continued Medical Work up  Expected Discharge Plan and Services Expected Discharge Plan: Skilled Nursing Facility In-house Referral: Clinical Social Work Discharge Planning Services: NA   Living arrangements for the past 2 months: Assisted Living Facility([facility])                                       Social Determinants of Health (SDOH) Interventions    Readmission Risk Interventions No flowsheet data found.

## 2019-02-24 NOTE — Progress Notes (Signed)
Physical Therapy Treatment Patient Details Name: Sabrina Burke MRN: [id number] DOB: October 06, 1927 Today's Date: 02/24/2019    History of Present Illness Pt admitted for L hip fracture s/p falling out of bed at ALF. Pt is now s/p L hip hemiarthroplasty, post approach on 02/21/19. Has history of dementia and HTN.    PT Comments    Pt was long sitting in bed upon arriving. She was alert and wide awake however only oriented to self. Pt much more talkative today and able to follow more 1 step commands but continues to be inconsistent. Unable to trial EOB sitting 2/2 pain with all movements of LLE. She did participate with exercises performed in bed with BLEs however required a lot of assist and increased time. Pt was repositioned in bed with SCds reapplied, abduction pillow placed, bed alarm set, and RN informed of PT session. Pt's family are not wanting pt to go to SNF. She is non ambulatory and required max assist prior to admission. PT will continue to follow per POC.      Follow Up Recommendations  Supervision/Assistance - 24 hour;Home health PT(family requesting not to go to rehab)     Equipment Recommendations  None recommended by PT    Recommendations for Other Services       Precautions / Restrictions Precautions Precautions: Fall;Posterior Hip Required Braces or Orthoses: Other Brace(hip abduction pillow) Restrictions Weight Bearing Restrictions: Yes LLE Weight Bearing: Weight bearing as tolerated    Mobility  Bed Mobility                  Transfers                    Ambulation/Gait                 Stairs             Wheelchair Mobility    Modified Rankin (Stroke Patients Only)       Balance                                            Cognition Arousal/Alertness: Awake/alert(pt much more alert and talkative this date however disorient)   Overall Cognitive Status: History of cognitive impairments - at baseline                                  General Comments: Pt was oriented to self only. She was however more talkative and able to follow more 1 step commands with increased time required. She was inconsistant however throughout session.       Exercises General Exercises - Lower Extremity Ankle Circles/Pumps: AROM;Both;10 reps;Supine Quad Sets: AROM;Both;5 reps;Supine Gluteal Sets: Other (comment)(attempted) Heel Slides: AAROM;Left;10 reps;Supine;Other (comment)(minimal pt participation)    General Comments        Pertinent Vitals/Pain Pain Assessment: Faces Faces Pain Scale: Hurts even more Pain Location: knee/hip LLE with movement Pain Descriptors / Indicators: Grimacing Pain Intervention(s): Limited activity within patient's tolerance    Home Living                      Prior Function            PT Goals (current goals can now be found in the care plan section) Acute Rehab PT Goals Patient Stated  Goal: Pt unable to state Progress towards PT goals: Not progressing toward goals - comment(cognition deficits/pain limiting progression)    Frequency    7X/week      PT Plan Current plan remains appropriate    Co-evaluation              AM-PAC PT "6 Clicks" Mobility   Outcome Measure  Help needed turning from your back to your side while in a flat bed without using bedrails?: Total Help needed moving from lying on your back to sitting on the side of a flat bed without using bedrails?: Total Help needed moving to and from a bed to a chair (including a wheelchair)?: Total Help needed standing up from a chair using your arms (e.g., wheelchair or bedside chair)?: Total Help needed to walk in hospital room?: Total Help needed climbing 3-5 steps with a railing? : Total 6 Click Score: 6    End of Session   Activity Tolerance: Patient limited by pain Patient left: in bed;with bed alarm set;with call bell/phone within reach;with SCD's reapplied Nurse  Communication: Mobility status PT Visit Diagnosis: Muscle weakness (generalized) (M62.81);History of falling (Z91.81)     Time: 0397-9536 PT Time Calculation (min) (ACUTE ONLY): 15 min  Charges:  $Therapeutic Exercise: 8-22 mins                     Jetta Lout PTA 02/24/19, 10:07 AM

## 2019-02-24 NOTE — Progress Notes (Signed)
Daily Progress Note   Patient Name: Sabrina Burke       Date: 02/24/2019 DOB: 1927/06/20  Age: 86 y.o. MRN#: [id number] Attending Physician: Lynn Ito, MD Primary Care Physician: Devoria Glassing, NP Admit Date: 02/20/2019  Reason for Consultation/Follow-up: Establishing goals of care  Subjective: Patient awake, alert to self. She denied pain. Able to follow some commands. Is tolerating dysphagia diet as reported by RN. No family at the bedside.   Updates and support given to daughter. Goals remain set. Patient to return back to [facility] with PT/OT. She will continued to be followed by outpatient palliative for support.   Length of Stay: 4  Current Medications: Scheduled Meds:  . Chlorhexidine Gluconate Cloth  6 each Topical Daily  . cholecalciferol  2,000 Units Oral Daily  . docusate sodium  100 mg Oral BID  . ferrous sulfate  325 mg Oral TID PC  . heparin injection (subcutaneous)  5,000 Units Subcutaneous Q8H  . [START ON 02/25/2019] levothyroxine  88 mcg Oral Q0600  . LORazepam  0.25 mg Oral BID  . metoprolol tartrate  12.5 mg Oral BID  . multivitamin with minerals   Oral Daily  . senna  1 tablet Oral BID  . traMADol  50 mg Oral Q6H    Continuous Infusions: . dextrose 5 % and 0.45% NaCl 100 mL/hr at 02/23/19 1651  . methocarbamol (ROBAXIN) IV    . piperacillin-tazobactam (ZOSYN)  IV 3.375 g (02/24/19 0901)    PRN Meds: acetaminophen, acetaminophen, alum & mag hydroxide-simeth, bisacodyl, HYDROcodone-acetaminophen, HYDROcodone-acetaminophen, labetalol, loperamide, LORazepam, magnesium citrate, menthol-cetylpyridinium **OR** phenol, methocarbamol **OR** methocarbamol (ROBAXIN) IV, morphine injection, ondansetron **OR** ondansetron (ZOFRAN) IV, polyethylene glycol,  traZODone  Physical Exam         -awake, alert to self only, chronically-ill appearing -RRR -diminished bilaterally -will follow commands, wedge in place   Vital Signs: BP (!) 158/72 (BP Location: Right Arm)   Pulse 83   Temp 98.2 F (36.8 C) (Oral)   Resp 16   Ht 5\' 2"  (1.575 m)   Wt 45.4 kg   SpO2 93%   BMI 18.29 kg/m  SpO2: SpO2: 93 % O2 Device: O2 Device: Room Air O2 Flow Rate: O2 Flow Rate (L/min): 1 L/min  Intake/output summary:   Intake/Output Summary (Last 24 hours) at 02/24/2019 1725 Last data  filed at 02/24/2019 1420 Gross per 24 hour  Intake 240 ml  Output 950 ml  Net -710 ml   LBM: Last BM Date: (unknown) Baseline Weight: Weight: 45.4 kg Most recent weight: Weight: 45.4 kg       Palliative Assessment/Data:      Patient Active Problem List   Diagnosis Date Noted  . Essential hypertension   . Acquired hypothyroidism   . Protein-calorie malnutrition, severe 02/23/2019  . Fever   . Closed left hip fracture ([place]) 02/20/2019  . Sepsis ([place]) 06/26/2018    Palliative Care Assessment & Plan    Recommendations/Plan:  Continue with current plan of care per medical team  Goals are set. Per daughter patient to return to [facility] once medically optimized and approved by facility. PT/OT/Palliative support.   PMT will continue to support as needed. I will be off service after today. Please call the PMT line for urgent needs. No weekend coverage.   Goals of Care and Additional Recommendations:  Limitations on Scope of Treatment: Full Scope Treatment  Code Status:    Code Status Orders  (From admission, onward)         Start     Ordered   02/20/19 2314  Do not attempt resuscitation (DNR)  Continuous    Question Answer Comment  In the event of cardiac or respiratory ARREST Do not call a "code blue"   In the event of cardiac or respiratory ARREST Do not perform Intubation, CPR, defibrillation or ACLS   In the event of cardiac or respiratory  ARREST Use medication by any route, position, wound care, and other measures to relive pain and suffering. May use oxygen, suction and manual treatment of airway obstruction as needed for comfort.      02/20/19 2335        Code Status History    Date Active Date Inactive Code Status Order ID Comments User Context   10/21/2018 2111 10/24/2018 1825 DNR [id number]  Bettey Costa, MD Inpatient   08/22/2018 0016 08/24/2018 1456 DNR [id number]  Mayer Camel, NP ED   06/26/2018 0626 06/28/2018 1904 DNR [id number]  Christel Mormon, MD Inpatient   06/26/2018 0412 06/26/2018 0626 Full Code [id number]  Seals, Theo Dills, NP ED   Advance Care Planning Activity    Advance Directive Documentation     Most Recent Value  Type of Advance Directive  Out of facility DNR (pink MOST or yellow form)  Pre-existing out of facility DNR order (yellow form or pink MOST form)  Yellow form placed in chart (order not valid for inpatient use)  "MOST" Form in Place?  --       Prognosis:  Guarded   Discharge Planning:  [place] with Palliative   Thank you for allowing the Palliative Medicine Team to assist in the care of this patient.  Time Total: 25 min.   Visit consisted of counseling and education dealing with the complex and emotionally intense issues of symptom management and palliative care in the setting of serious and potentially life-threatening illness.Greater than 50%  of this time was spent counseling and coordinating care related to the above assessment and plan.  Alda Lea, AGPCNP-BC  Palliative Medicine Team [phone]   Please contact Palliative Medicine Team phone at [phone] for questions and concerns.

## 2019-02-24 NOTE — Progress Notes (Signed)
  Subjective:  POD #3 s/p left hip hemiarthroplasty.  Patient is sitting up in bed.  She is awake and responds to questions and follows commands.  Patient's daughter is at the bedside.  Patient reports left hip pain as mild.  Patient has no other complaints.  Objective:   VITALS:   Vitals:   02/23/19 1620 02/23/19 1738 02/23/19 2313 02/24/19 0754  BP: (!) 169/58 (!) 119/40 (!) 141/48 138/66  Pulse: 87 78 66 63  Resp: 12 17 16 17   Temp: 99.2 F (37.3 C) 98.2 F (36.8 C) 97.7 F (36.5 C) 98 F (36.7 C)  TempSrc: Oral Oral Oral Oral  SpO2: 93%  97% 96%  Weight:      Height:        PHYSICAL EXAM: Left lower extremity Neurovascular intact Sensation intact distally Intact pulses distally Dorsiflexion/Plantar flexion intact Incision: scant drainage No cellulitis present Compartment soft  LABS  Results for orders placed or performed during the hospital encounter of 02/20/19 (from the past 24 hour(s))  CBC     Status: Abnormal   Collection Time: 02/24/19  4:59 AM  Result Value Ref Range   WBC 7.8 4.0 - 10.5 K/uL   RBC 2.50 (L) 3.87 - 5.11 MIL/uL   Hemoglobin 8.1 (L) 12.0 - 15.0 g/dL   HCT 04/24/19 (L) 30.1 - 60.1 %   MCV 105.2 (H) 80.0 - 100.0 fL   MCH 32.4 26.0 - 34.0 pg   MCHC 30.8 30.0 - 36.0 g/dL   RDW 09.3 23.5 - 57.3 %   Platelets 243 150 - 400 K/uL   nRBC 0.0 0.0 - 0.2 %  Basic metabolic panel     Status: Abnormal   Collection Time: 02/24/19  4:59 AM  Result Value Ref Range   Sodium 147 (H) 135 - 145 mmol/L   Potassium 3.7 3.5 - 5.1 mmol/L   Chloride 113 (H) 98 - 111 mmol/L   CO2 26 22 - 32 mmol/L   Glucose, Bld 169 (H) 70 - 99 mg/dL   BUN 39 (H) 8 - 23 mg/dL   Creatinine, Ser 04/24/19 (H) 0.44 - 1.00 mg/dL   Calcium 8.4 (L) 8.9 - 10.3 mg/dL   GFR calc non Af Amer 19 (L) >60 mL/min   GFR calc Af Amer 22 (L) >60 mL/min   Anion gap 8 5 - 15    No results found.  Assessment/Plan: 3 Days Post-Op   Active Problems:   Closed left hip fracture (HCC)   Fever  Protein-calorie malnutrition, severe  Patient doing well from an orthopedic standpoint.  Continue physical therapy as the patient can tolerate.  Patient will continue Lovenox 40 mg daily for DVT prophylaxis x2 weeks postop.  Patient is weightbearing as tolerated on the left lower extremity with posterior hip precautions.  She should continue wearing her abduction pillow while in bed.  Patient will follow up in the office in 10 to 14 days for wound check, staple removal and x-ray.  Patient is okay for discharge from an orthopedic standpoint once cleared medically.    2.54 , MD 02/24/2019, 3:38 PM

## 2019-02-25 DIAGNOSIS — E870 Hyperosmolality and hypernatremia: Secondary | ICD-10-CM

## 2019-02-25 LAB — CULTURE, BLOOD (ROUTINE X 2)
Culture: NO GROWTH
Culture: NO GROWTH
Special Requests: ADEQUATE

## 2019-02-25 LAB — BASIC METABOLIC PANEL
Anion gap: 8 (ref 5–15)
BUN: 36 mg/dL — ABNORMAL HIGH (ref 8–23)
CO2: 26 mmol/L (ref 22–32)
Calcium: 8.5 mg/dL — ABNORMAL LOW (ref 8.9–10.3)
Chloride: 116 mmol/L — ABNORMAL HIGH (ref 98–111)
Creatinine, Ser: 2.2 mg/dL — ABNORMAL HIGH (ref 0.44–1.00)
GFR calc Af Amer: 22 mL/min — ABNORMAL LOW (ref >60)
GFR calc non Af Amer: 19 mL/min — ABNORMAL LOW (ref >60)
Glucose, Bld: 122 mg/dL — ABNORMAL HIGH (ref 70–99)
Potassium: 3.6 mmol/L (ref 3.5–5.1)
Sodium: 150 mmol/L — ABNORMAL HIGH (ref 135–145)

## 2019-02-25 MED ORDER — FAMOTIDINE 20 MG PO TABS
20.0000 mg | ORAL_TABLET | Freq: Every day | ORAL | Status: DC
  Administered 2019-02-25 – 2019-03-02 (×5): 20 mg via ORAL
  Filled 2019-02-25 (×5): qty 1

## 2019-02-25 MED ORDER — SODIUM CHLORIDE 0.45 % IV SOLN: INTRAVENOUS | Status: DC

## 2019-02-25 NOTE — Progress Notes (Signed)
  Subjective:  POD #4 s/p left hip hemiarthroplasty.   Patient reports left hip pain as mild.  Patient has increased pain with movement per the physical therapy notes.  Patient currently afebrile.  White count has not been elevated.  Patient on Zosyn for possible bacteremia given recent fevers.  Objective:   VITALS:   Vitals:   02/25/19 0018 02/25/19 0616 02/25/19 0800 02/25/19 1620  BP: (!) 152/58 (!) 151/67 (!) 156/62 (!) 164/64  Pulse: 76 70 73 62  Resp: 16 16 16 16   Temp: 98.3 F (36.8 C) 98.3 F (36.8 C) 97.8 F (36.6 C) 98 F (36.7 C)  TempSrc: Oral Oral Oral Oral  SpO2: 93% 95% 95% 98%  Weight:      Height:        PHYSICAL EXAM: Left lower extremity Neurovascular intact Sensation intact distally Intact pulses distally Dorsiflexion/Plantar flexion intact Incision: scant drainage No cellulitis present Compartment soft  LABS  Results for orders placed or performed during the hospital encounter of 02/20/19 (from the past 24 hour(s))  Basic metabolic panel     Status: Abnormal   Collection Time: 02/25/19  4:44 AM  Result Value Ref Range   Sodium 150 (H) 135 - 145 mmol/L   Potassium 3.6 3.5 - 5.1 mmol/L   Chloride 116 (H) 98 - 111 mmol/L   CO2 26 22 - 32 mmol/L   Glucose, Bld 122 (H) 70 - 99 mg/dL   BUN 36 (H) 8 - 23 mg/dL   Creatinine, Ser 04/25/19 (H) 0.44 - 1.00 mg/dL   Calcium 8.5 (L) 8.9 - 10.3 mg/dL   GFR calc non Af Amer 19 (L) >60 mL/min   GFR calc Af Amer 22 (L) >60 mL/min   Anion gap 8 5 - 15    No results found.  Assessment/Plan: 4 Days Post-Op   Active Problems:   Closed left hip fracture (HCC)   Fever   Protein-calorie malnutrition, severe   Essential hypertension   Acquired hypothyroidism  Patient should continue physical therapy as she can tolerate.  Patient is weightbearing as tolerated on the left lower extremity with posterior hip precautions.  She is able to be discharged from an orthopedic standpoint when she is cleared medically.   Patient will follow-up in the office in 10 to 14 days after discharge from the hospital for staple removal and x-ray.  Continue antibiotics per the medical team.  Recommend patient be discharged on Lovenox 40 mg daily next 14 days for DVT prophylaxis.   0.73 , MD 02/25/2019, 4:58 PM

## 2019-02-25 NOTE — Care Management Important Message (Signed)
Important Message  Patient Details  Name: Sabrina Burke MRN: [id number] Date of Birth: [birth year]   Medicare Important Message Given:  Yes     [name] A Shiva Karis 02/25/2019, 10:50 AM

## 2019-02-25 NOTE — NC FL2 (Signed)
[place] MEDICAID FL2 LEVEL OF CARE SCREENING TOOL     IDENTIFICATION  Patient Name: Sabrina Burke Birthdate: 1927-09-02 Sex: female Admission Date (Current Location): 02/20/2019  [place] and [place] Number:  Chiropodist and Address:  [facility], [address], [place], [place] [address]      Provider Number: [id number]  Attending Physician Name and Address:  Lynn Ito, MD  Relative Name and Phone Number:  [name] daughter [phone]-cell    Current Level of Care: Hospital Recommended Level of Care: Memory Care Prior Approval Number:    Date Approved/Denied:   PASRR Number: [id number]  Discharge Plan: Other (Comment)(ALF)    Current Diagnoses: Patient Active Problem List   Diagnosis Date Noted  . Essential hypertension   . Acquired hypothyroidism   . Protein-calorie malnutrition, severe 02/23/2019  . Fever   . Closed left hip fracture (HCC) 02/20/2019  . Sepsis (HCC) 06/26/2018    Orientation RESPIRATION BLADDER Height & Weight     Self  Normal External catheter Weight: 100 lb (45.4 kg) Height:  5\' 2"  (157.5 cm)  BEHAVIORAL SYMPTOMS/MOOD NEUROLOGICAL BOWEL NUTRITION STATUS      Incontinent Diet(Dys 1-pureed-nectar thick liquids)  AMBULATORY STATUS COMMUNICATION OF NEEDS Skin   Total Care Verbally Surgical wounds                       Personal Care Assistance Level of Assistance  Bathing, Feeding Bathing Assistance: Maximum assistance Feeding assistance: Maximum assistance Dressing Assistance: Maximum assistance     Functional Limitations Info  Speech     Speech Info: Impaired    SPECIAL CARE FACTORS FREQUENCY  PT (By licensed PT)     PT Frequency: 3x week              Contractures Contractures Info: Not present    Additional Factors Info  Code Status, Allergies Code Status Info: DNR Allergies Info: no known allergies           Current Medications (02/25/2019):  This is the  current hospital active medication list Current Facility-Administered Medications  Medication Dose Route Frequency Provider Last Rate Last Admin  . 0.45 % sodium chloride infusion   Intravenous Continuous 04/25/2019, MD      . acetaminophen (TYLENOL) suppository 650 mg  650 mg Rectal Q6H PRN Lynn Ito, MD   650 mg at 02/22/19 1810  . acetaminophen (TYLENOL) tablet 325-650 mg  325-650 mg Oral Q6H PRN 04/22/19, MD   325 mg at 02/23/19 1635  . Chlorhexidine Gluconate Cloth 2 % PADS 6 each  6 each Topical Daily 04/23/19, MD   6 each at 02/22/19 [phone]  . cholecalciferol (VITAMIN D3) tablet 2,000 Units  2,000 Units Oral Daily 5400, MD   2,000 Units at 02/25/19 [phone]  . famotidine (PEPCID) tablet 20 mg  20 mg Oral QHS 8676, MD      . ferrous sulfate tablet 325 mg  325 mg Oral TID Lynn Ito, MD   325 mg at 02/25/19 0848  . heparin injection 5,000 Units  5,000 Units Subcutaneous Q8H 04/25/19, MD   5,000 Units at 02/25/19 0520  . HYDROcodone-acetaminophen (NORCO) 7.5-325 MG per tablet 1-2 tablet  1-2 tablet Oral Q4H PRN 05-20-1976, MD      . HYDROcodone-acetaminophen (NORCO/VICODIN) 5-325 MG per tablet 1-2 tablet  1-2 tablet Oral Q4H PRN Juanell Fairly, MD   1 tablet at 02/23/19 1635  . labetalol (NORMODYNE)  injection 20 mg  20 mg Intravenous Q3H PRN Thornton Park, MD      . levothyroxine (SYNTHROID) tablet 88 mcg  88 mcg Oral Q0600 Nolberto Hanlon, MD   88 mcg at 02/25/19 0521  . loperamide (IMODIUM) capsule 2 mg  2 mg Oral PRN Thornton Park, MD      . LORazepam (ATIVAN) tablet 0.25 mg  0.25 mg Oral BID PRN Thornton Park, MD      . LORazepam (ATIVAN) tablet 0.25 mg  0.25 mg Oral BID Thornton Park, MD   0.25 mg at 02/23/19 2330  . magnesium citrate solution 1 Bottle  1 Bottle Oral Once PRN Thornton Park, MD      . menthol-cetylpyridinium (CEPACOL) lozenge 3 mg  1 lozenge Oral PRN Thornton Park, MD       Or  . phenol  (CHLORASEPTIC) mouth spray 1 spray  1 spray Mouth/Throat PRN Thornton Park, MD      . methocarbamol (ROBAXIN) tablet 500 mg  500 mg Oral Q6H PRN Thornton Park, MD       Or  . methocarbamol (ROBAXIN) 500 mg in dextrose 5 % 50 mL IVPB  500 mg Intravenous Q6H PRN Thornton Park, MD      . metoprolol tartrate (LOPRESSOR) tablet 12.5 mg  12.5 mg Oral BID Nolberto Hanlon, MD   12.5 mg at 02/25/19 0848  . morphine 4 MG/ML injection 0.52-1 mg  0.52-1 mg Intravenous Q2H PRN Thornton Park, MD      . multivitamin with minerals tablet   Oral Daily Thornton Park, MD   1 tablet at 02/25/19 0850  . ondansetron (ZOFRAN) tablet 4 mg  4 mg Oral Q6H PRN Thornton Park, MD       Or  . ondansetron [facility]) injection 4 mg  4 mg Intravenous Q6H PRN Thornton Park, MD      . piperacillin-tazobactam (ZOSYN) IVPB 3.375 g  3.375 g Intravenous Q12H Benita Gutter, RPH 12.5 mL/hr at 02/25/19 0731 3.375 g at 02/25/19 0731  . polyethylene glycol (MIRALAX / GLYCOLAX) packet 17 g  17 g Oral Daily PRN Thornton Park, MD      . senna (SENOKOT) tablet 8.6 mg  1 tablet Oral BID Thornton Park, MD   8.6 mg at 02/25/19 0849  . traMADol (ULTRAM) tablet 50 mg  50 mg Oral Q6H Thornton Park, MD   50 mg at 02/25/19 0521  . traZODone (DESYREL) tablet 25 mg  25 mg Oral QHS PRN Thornton Park, MD         Discharge Medications: Please see discharge summary for a list of discharge medications.  Relevant Imaging Results:  Relevant Lab Results:   Additional Information SSN: [id number]  Sabrina Burke, Sabrina Rhyme, LCSW

## 2019-02-25 NOTE — Progress Notes (Signed)
Physical Therapy Treatment Patient Details Name: Sabrina Burke MRN: [id number] DOB: [birth year] Today's Date: 02/25/2019    History of Present Illness Pt admitted for L hip fracture s/p falling out of bed at ALF. Pt is now s/p L hip hemiarthroplasty, post approach on 02/21/19. Has history of dementia and HTN.    PT Comments    Pt was supine in bed with Hob elevated upon arriving. She was awake and oriented to self only. Pt's cognition limiting pt's progress. Reported no pain at rest however demonstrates sign of severe pain with movements. She was able to follow simple commands more this date then previous however continues to be inconsistent and limited. See exercises performed below. Attempted sitting pt up however she becomes aggitated and was unwilling. PT will continue to follow and progress as able.      Follow Up Recommendations  Supervision/Assistance - 24 hour;Home health PT     Equipment Recommendations  Hospital bed    Recommendations for Other Services       Precautions / Restrictions Precautions Precautions: Fall;Posterior Hip Precaution Booklet Issued: No Required Braces or Orthoses: Other Brace(abd pillow) Restrictions Weight Bearing Restrictions: Yes LLE Weight Bearing: Weight bearing as tolerated    Mobility  Bed Mobility Overal bed mobility: Needs Assistance             General bed mobility comments: Attempt to motivate pt to sit EOB however pt becomes aggitated and resist   Transfers                    Ambulation/Gait                 Stairs             Wheelchair Mobility    Modified Rankin (Stroke Patients Only)       Balance                                            Cognition Arousal/Alertness: Awake/alert Behavior During Therapy: Agitated Overall Cognitive Status: History of cognitive impairments - at baseline                                 General Comments: pt was oriented to  self only however was able to follow commands more consistantly this date. Gets aggitated when attempted to sit up or move LLE      Exercises General Exercises - Lower Extremity Ankle Circles/Pumps: AROM;Both;10 reps;Supine Quad Sets: (attempted to perform however pt unable to follow or perform) Heel Slides: AAROM;Left;10 reps;Supine Hip ABduction/ADduction: AAROM;10 reps;Supine;PROM(AAROM but mostly passive ) Straight Leg Raises: AAROM;PROM;Left;5 reps(mostly passive 2/2 to pain/ pt inability to follow commands)    General Comments        Pertinent Vitals/Pain Pain Assessment: No/denies pain Pain Score: 0-No pain Faces Pain Scale: Hurts little more Pain Location: knee/hip LLE with movement Pain Descriptors / Indicators: Grimacing;Sharp;Shooting Pain Intervention(s): Limited activity within patient's tolerance;Monitored during session;Relaxation    Home Living                      Prior Function            PT Goals (current goals can now be found in the care plan section) Acute Rehab PT Goals Patient Stated Goal: Pt unable to  state Progress towards PT goals: Not progressing toward goals - comment(cognition and pain limiting progress)    Frequency    7X/week      PT Plan Current plan remains appropriate    Co-evaluation              AM-PAC PT "6 Clicks" Mobility   Outcome Measure  Help needed turning from your back to your side while in a flat bed without using bedrails?: Total Help needed moving from lying on your back to sitting on the side of a flat bed without using bedrails?: Total Help needed moving to and from a bed to a chair (including a wheelchair)?: Total Help needed standing up from a chair using your arms (e.g., wheelchair or bedside chair)?: Total Help needed to walk in hospital room?: Total Help needed climbing 3-5 steps with a railing? : Total 6 Click Score: 6    End of Session Equipment Utilized During Treatment: Other  (comment)(abduction pillow) Activity Tolerance: Patient limited by pain Patient left: in bed;with bed alarm set;with call bell/phone within reach;with SCD's reapplied Nurse Communication: Mobility status PT Visit Diagnosis: Muscle weakness (generalized) (M62.81);History of falling (Z91.81)     Time: 7793-9030 PT Time Calculation (min) (ACUTE ONLY): 14 min  Charges:  $Therapeutic Exercise: 8-22 mins                     Julaine Fusi PTA 02/25/19, 10:11 AM

## 2019-02-25 NOTE — TOC Progression Note (Signed)
Transition of Care [facility]) - Progression Note    Patient Details  Name: Sabrina Burke MRN: [id number] Date of Birth: 28-Sep-1927  Transition of Care [facility]) CM/SW Contact  Maryna Yeagle, Lemar Livings, LCSW Phone Number: 02/25/2019, 10:52 AM  Clinical Narrative:  MD reports pt's sodium is high and she is not medically ready for transfer back to [facility] today. Have contacted facility and spoke with [place] who reports their RN will need to evaluator prior to return. This worker asked when they would be doing this, it needs to be either today or tomorrow, since MD feels pt may be ready to trasnfer over the weekend. [facility] also has had a COVID outbreak. Have faxed information to the facility to assist with the evaluation. Have informed daughter of this information. Daughter hopes she will not transfer over the weekend due to weekend staff differences. She is aware up to the MD and when she is medically stable to return. Order for hospital bed in chart-per daughter's preference. Have asked weekend social worker to check this over the weekend and transfer back to facility if ready. MD to order new COVID test tomorrow if medically ready to transfer. Continue to work on discharge back to [facility]    Expected Discharge Plan: Skilled Nursing Facility Barriers to Discharge: Continued Medical Work up  Expected Discharge Plan and Services Expected Discharge Plan: Skilled Nursing Facility In-house Referral: Clinical Social Work Discharge Planning Services: NA   Living arrangements for the past 2 months: Assisted Living Facility( House)                                       Social Determinants of Health (SDOH) Interventions    Readmission Risk Interventions No flowsheet data found.

## 2019-02-25 NOTE — Progress Notes (Signed)
PROGRESS NOTE    Sani Loiseau  TSV:[id number] DOB: 1927-03-10 DOA: 02/20/2019 PCP: Odessa Fleming, NP    Brief Narrative:  Charleene Callegari  is a 86 y.o. pleasantly demented Caucasian female with a known history of dementia and hypertension, who presented to the emergency room with acute onset of left hip pain after having a mechanical fall at [facility] facility.    Consultants:   Ortho  Procedures:  S/P left hip hemiarthroplasty on 02/21/19  Antimicrobials:   Zosyn   Subjective: Pt has no complaints. More interactive but at baseline confusion. Denies sob, pain, or any other complaints  Objective: Vitals:   02/25/19 0018 02/25/19 0616 02/25/19 0800 02/25/19 1620  BP: (!) 152/58 (!) 151/67 (!) 156/62 (!) 164/64  Pulse: 76 70 73 62  Resp: 16 16 16 16   Temp: 98.3 F (36.8 C) 98.3 F (36.8 C) 97.8 F (36.6 C) 98 F (36.7 C)  TempSrc: Oral Oral Oral Oral  SpO2: 93% 95% 95% 98%  Weight:      Height:       No intake or output data in the 24 hours ending 02/25/19 1655 Filed Weights   02/20/19 1951  Weight: 45.4 kg    Examination:  General exam: Appears calm and comfortable , nad Respiratory system: Clear to auscultation with poor respiratory effort.  No w/r/r Cardiovascular system: S1 & S2 heard, RRR. No  murmurs, rubs, gallops or clicks.  Gastrointestinal system: Abdomen is nondistended, soft and nontender.  Normal bowel sounds heard. Central nervous system: awake, not oriented at all, confused, pleasant Extremities:no edema, which between legs Skin: warm, dry Psychiatry: unable to assess.     Data Reviewed: I have personally reviewed following labs and imaging studies  CBC: Recent Labs  Lab 02/20/19 2053 02/21/19 0442 02/22/19 0419 02/23/19 0553 02/24/19 0459  WBC 13.2* 10.7* 8.9 9.7 7.8  NEUTROABS 9.4*  --   --   --   --   HGB 10.3* 9.7* 8.6* 8.1* 8.1*  HCT 32.0* 31.1* 26.9* 26.4* 26.3*  MCV 101.3* 103.3* 103.1* 106.0* 105.2*   PLT 266 196 205 218 923   Basic Metabolic Panel: Recent Labs  Lab 02/21/19 0442 02/22/19 0419 02/23/19 0553 02/24/19 0459 02/25/19 0444  NA 150* 147* 149* 147* 150*  K 3.9 3.5 3.6 3.7 3.6  CL 114* 114* 115* 113* 116*  CO2 23 24 27 26 26   GLUCOSE 120* 184* 150* 169* 122*  BUN 49* 36* 39* 39* 36*  CREATININE 1.75* 1.63* 2.17* 2.20* 2.20*  CALCIUM 8.5* 8.2* 8.3* 8.4* 8.5*   GFR: Estimated Creatinine Clearance: 12.4 mL/min (A) (by C-G formula based on SCr of 2.2 mg/dL (H)). Liver Function Tests: Recent Labs  Lab 02/20/19 2053  AST 27  ALT 22  ALKPHOS 77  BILITOT 0.6  PROT 7.2  ALBUMIN 2.9*   No results for input(s): LIPASE, AMYLASE in the last 168 hours. No results for input(s): AMMONIA in the last 168 hours. Coagulation Profile: Recent Labs  Lab 02/20/19 2053  INR 1.1   Cardiac Enzymes: No results for input(s): CKTOTAL, CKMB, CKMBINDEX, TROPONINI in the last 168 hours. BNP (last 3 results) No results for input(s): PROBNP in the last 8760 hours. HbA1C: No results for input(s): HGBA1C in the last 72 hours. CBG: Recent Labs  Lab 02/20/19 1955  GLUCAP 151*   Lipid Profile: No results for input(s): CHOL, HDL, LDLCALC, TRIG, CHOLHDL, LDLDIRECT in the last 72 hours. Thyroid Function Tests: No results for input(s): TSH, T4TOTAL,  FREET4, T3FREE, THYROIDAB in the last 72 hours. Anemia Panel: No results for input(s): VITAMINB12, FOLATE, FERRITIN, TIBC, IRON, RETICCTPCT in the last 72 hours. Sepsis Labs: Recent Labs  Lab 02/20/19 2053  LATICACIDVEN 1.1    Recent Results (from the past 240 hour(s))  Respiratory Panel by RT PCR (Flu A&B, Covid) - Nasopharyngeal Swab     Status: None   Collection Time: 02/20/19  8:36 PM   Specimen: Nasopharyngeal Swab  Result Value Ref Range Status   SARS Coronavirus 2 by RT PCR NEGATIVE NEGATIVE Final    Comment: (NOTE) SARS-CoV-2 target nucleic acids are NOT DETECTED. The SARS-CoV-2 RNA is generally detectable in upper  respiratoy specimens during the acute phase of infection. The lowest concentration of SARS-CoV-2 viral copies this assay can detect is 131 copies/mL. A negative result does not preclude SARS-Cov-2 infection and should not be used as the sole basis for treatment or other patient management decisions. A negative result may occur with  improper specimen collection/handling, submission of specimen other than nasopharyngeal swab, presence of viral mutation(s) within the areas targeted by this assay, and inadequate number of viral copies (<131 copies/mL). A negative result must be combined with clinical observations, patient history, and epidemiological information. The expected result is Negative. Fact Sheet for Patients:  https://www.moore.com/ Fact Sheet for Healthcare Providers:  https://www.young.biz/ This test is not yet ap proved or cleared by the [place] FDA and  has been authorized for detection and/or diagnosis of SARS-CoV-2 by FDA under an Emergency Use Authorization (EUA). This EUA will remain  in effect (meaning this test can be used) for the duration of the COVID-19 declaration under Section 564(b)(1) of the Act, 21 U.S.C. section 360bbb-3(b)(1), unless the authorization is terminated or revoked sooner.    Influenza A by PCR NEGATIVE NEGATIVE Final   Influenza B by PCR NEGATIVE NEGATIVE Final    Comment: (NOTE) The Xpert Xpress SARS-CoV-2/FLU/RSV assay is intended as an aid in  the diagnosis of influenza from Nasopharyngeal swab specimens and  should not be used as a sole basis for treatment. Nasal washings and  aspirates are unacceptable for Xpert Xpress SARS-CoV-2/FLU/RSV  testing. Fact Sheet for Patients: https://www.moore.com/ Fact Sheet for Healthcare Providers: https://www.young.biz/ This test is not yet approved or cleared by the [place] FDA and  has been authorized for  detection and/or diagnosis of SARS-CoV-2 by  FDA under an Emergency Use Authorization (EUA). This EUA will remain  in effect (meaning this test can be used) for the duration of the  Covid-19 declaration under Section 564(b)(1) of the Act, 21  U.S.C. section 360bbb-3(b)(1), unless the authorization is  terminated or revoked. Performed at [facility], [address] [place], [place] [address]   Blood culture (routine x 2)     Status: None   Collection Time: 02/20/19  8:53 PM   Specimen: BLOOD  Result Value Ref Range Status   Specimen Description BLOOD RIGHT ANTECUBITAL  Final   Special Requests   Final    BOTTLES DRAWN AEROBIC AND ANAEROBIC Blood Culture results may not be optimal due to an inadequate volume of blood received in culture bottles   Culture   Final    NO GROWTH 5 DAYS Performed at [facility], [address], [place], [place] [address]    Report Status 02/25/2019 FINAL  Final  Blood culture (routine x 2)     Status: None   Collection Time: 02/20/19  8:53 PM   Specimen: BLOOD  Result Value  Ref Range Status   Specimen Description BLOOD BLOOD LEFT HAND  Final   Special Requests   Final    BOTTLES DRAWN AEROBIC AND ANAEROBIC Blood Culture adequate volume   Culture   Final    NO GROWTH 5 DAYS Performed at [facility], [address] [place], [place] [address]    Report Status 02/25/2019 FINAL  Final  Urine Culture     Status: Abnormal   Collection Time: 02/20/19 11:44 PM   Specimen: Urine, Clean Catch  Result Value Ref Range Status   Specimen Description   Final    URINE, CLEAN CATCH Performed at [facility], [address], [place], [place] [address]    Special Requests   Final    NONE Performed at [facility], [address] [place], [place] [address]    Culture >=100,000 COLONIES/mL ESCHERICHIA COLI (A)  Final   Report Status 02/22/2019 FINAL  Final   Organism ID, Bacteria ESCHERICHIA COLI (A)  Final       Susceptibility   Escherichia coli - MIC*    AMPICILLIN 8 SENSITIVE Sensitive     CEFAZOLIN <=4 SENSITIVE Sensitive     CEFTRIAXONE <=0.25 SENSITIVE Sensitive     CIPROFLOXACIN <=0.25 SENSITIVE Sensitive     GENTAMICIN <=1 SENSITIVE Sensitive     IMIPENEM <=0.25 SENSITIVE Sensitive     NITROFURANTOIN <=16 SENSITIVE Sensitive     TRIMETH/SULFA <=20 SENSITIVE Sensitive     AMPICILLIN/SULBACTAM <=2 SENSITIVE Sensitive     PIP/TAZO <=4 SENSITIVE Sensitive     * >=100,000 COLONIES/mL ESCHERICHIA COLI  Surgical pcr screen     Status: None   Collection Time: 02/21/19 12:50 AM   Specimen: Nasal Mucosa; Nasal Swab  Result Value Ref Range Status   MRSA, PCR NEGATIVE NEGATIVE Final   Staphylococcus aureus NEGATIVE NEGATIVE Final    Comment: (NOTE) The Xpert SA Assay (FDA approved for NASAL specimens in patients 36 years of age and older), is one component of a comprehensive surveillance program. It is not intended to diagnose infection nor to guide or monitor treatment. Performed at [facility], [address] [place], [place] [address]   CULTURE, BLOOD (ROUTINE X 2) w Reflex to ID Panel     Status: None (Preliminary result)   Collection Time: 02/22/19  1:02 PM   Specimen: BLOOD  Result Value Ref Range Status   Specimen Description BLOOD BLOOD RIGHT HAND  Final   Special Requests   Final    BOTTLES DRAWN AEROBIC AND ANAEROBIC Blood Culture results may not be optimal due to an inadequate volume of blood received in culture bottles   Culture   Final    NO GROWTH 3 DAYS Performed at [facility], [address], [place], [place] [address]    Report Status PENDING  Incomplete  CULTURE, BLOOD (ROUTINE X 2) w Reflex to ID Panel     Status: None (Preliminary result)   Collection Time: 02/22/19  1:08 PM   Specimen: BLOOD  Result Value Ref Range Status   Specimen Description BLOOD RIGHT ANTECUBITAL  Final   Special Requests   Final    BOTTLES DRAWN AEROBIC AND  ANAEROBIC Blood Culture adequate volume   Culture   Final    NO GROWTH 3 DAYS Performed at [facility], [address] [place], [place] [address]    Report Status PENDING  Incomplete         Radiology Studies: No results found.  Scheduled Meds: . Chlorhexidine Gluconate Cloth  6 each Topical Daily  . cholecalciferol  2,000 Units Oral Daily  . famotidine  20 mg Oral QHS  . ferrous sulfate  325 mg Oral TID PC  . heparin injection (subcutaneous)  5,000 Units Subcutaneous Q8H  . levothyroxine  88 mcg Oral Q0600  . LORazepam  0.25 mg Oral BID  . metoprolol tartrate  12.5 mg Oral BID  . multivitamin with minerals   Oral Daily  . senna  1 tablet Oral BID  . traMADol  50 mg Oral Q6H   Continuous Infusions: . sodium chloride 50 mL/hr at 02/25/19 1148  . methocarbamol (ROBAXIN) IV    . piperacillin-tazobactam (ZOSYN)  IV 3.375 g (02/25/19 0731)    Assessment & Plan:   Active Problems:   Closed left hip fracture (HCC)   Fever   Protein-calorie malnutrition, severe   Essential hypertension   Acquired hypothyroidism   1. Left hip fracture- secondary to mechanical fall: S/P  left hip hemiarthroplasty; POD 3 - Pain management   - Cont IV hydration  - PT/OT per Ortho -rec. 24 hr supervision,home health. -Palliative care consulted, will f/u -speech swallow consult  2. UTI with subsequent sepsis:  With leukocytosis and fever On IV Zosyn since there was thought of UTI and aspiration pneumonia...>today day 5, will d/c -Urine culture grew E. Coli -Chest x-ray from 2/2 did not reveal aspiration Blood cultures TD negative  3.Hypernatremia-Etiology unclear. Will switch d51/2 to 1/2 ns, if does not improve will consult nephrology Ck am labs  4. Hypertensive urgency: Resolved now  - switch iv beta blk to po - continue amlodipine.  5. Acute kidney injury superimposed on stage IIIb chronic kidney disease.-- Improving following hydration  - Cont IV  normal  - creatinine up but hopefully stabilizing - Avoid potential nephrotoxins  6. Hypothyroidism. -- TSH WNL  - Continue home Synthroid, switch from iv to po   6.Nutrition- has poor intake Speech swallow evaluation consult -Continue Essential  medication to IV for now; continue to hold p.o. medications    DVT prophylaxis: Heparin  code Status:DNR Family Communication: none at bedside Disposition Plan: Will return back to residence home once her NA level improves as for now continues to rise. Need covid testing when we know she will be dc'd and accepted at her place of residence   LOS: 5 days   Time spent: 45 min with more than 50% on coc    Lynn Ito, MD [facility] Pager [phone]  If 7PM-7AM, please contact night-coverage [email or web address] Password [email or web address] 02/25/2019, 4:55 PM Patient ID: Tranise Forrest, female   DOB: Nov 10, 1927, 86 y.o.   MRN: [id number]

## 2019-02-25 NOTE — Consult Note (Signed)
Pharmacy Antibiotic Note  Sabrina Burke is a 86 y.o. female admitted on 02/20/2019 with possible bacteremia. Patient is s/p left hip hemiarthroplasty on 02/21/19. Patient previously with UTI and now c/f aspiration pneumonia or possible bacteremia.  Pharmacy has been consulted for  pip/tazo dosing.   Plan: This is day 4 of Pip/tazo 3.375 g q12h extended infusion (renally adjusted)   Height: 5\' 2"  (157.5 cm) Weight: 100 lb (45.4 kg) IBW/kg (Calculated) : 50.1  Temp (24hrs), Avg:98.2 F (36.8 C), Min:97.8 F (36.6 C), Max:98.3 F (36.8 C)  Recent Labs  Lab 02/20/19 2053 02/20/19 2053 02/21/19 0442 02/22/19 0419 02/23/19 0553 02/24/19 0459 02/25/19 0444  WBC 13.2*  --  10.7* 8.9 9.7 7.8  --   CREATININE 2.01*   < > 1.75* 1.63* 2.17* 2.20* 2.20*  LATICACIDVEN 1.1  --   --   --   --   --   --    < > = values in this interval not displayed.    Estimated Creatinine Clearance: 12.4 mL/min (A) (by C-G formula based on SCr of 2.2 mg/dL (H)).    No Known Allergies  Antimicrobials this admission: Cefazolin 2/1 >> 2/2  CTX 2/1 >>  Vancomycin 2/2 >> 2/3  Dose adjustments this admission: n/a  Microbiology results: 1/31 SARS-CoV-2 + Influenza A/B: negative 1/31 BCx: NG x 2 days 1/31 UCx: >= 100k colonies/mL E coli (pan-sensitive)  2/1 MRSA PCR: negative 2/2 BCx: pending  Thank you for allowing pharmacy to be a part of this patient's care.  2/31, PharmD, BCPS 02/25/2019 1:14 PM

## 2019-02-26 DIAGNOSIS — E876 Hypokalemia: Secondary | ICD-10-CM

## 2019-02-26 LAB — BASIC METABOLIC PANEL
Anion gap: 10 (ref 5–15)
BUN: 34 mg/dL — ABNORMAL HIGH (ref 8–23)
CO2: 23 mmol/L (ref 22–32)
Calcium: 8.3 mg/dL — ABNORMAL LOW (ref 8.9–10.3)
Chloride: 115 mmol/L — ABNORMAL HIGH (ref 98–111)
Creatinine, Ser: 2.08 mg/dL — ABNORMAL HIGH (ref 0.44–1.00)
GFR calc Af Amer: 24 mL/min — ABNORMAL LOW (ref >60)
GFR calc non Af Amer: 21 mL/min — ABNORMAL LOW (ref >60)
Glucose, Bld: 126 mg/dL — ABNORMAL HIGH (ref 70–99)
Potassium: 3.3 mmol/L — ABNORMAL LOW (ref 3.5–5.1)
Sodium: 148 mmol/L — ABNORMAL HIGH (ref 135–145)

## 2019-02-26 MED ORDER — POTASSIUM CHLORIDE CRYS ER 10 MEQ PO TBCR
10.0000 meq | EXTENDED_RELEASE_TABLET | Freq: Two times a day (BID) | ORAL | Status: DC
  Filled 2019-02-26: qty 1

## 2019-02-26 MED ORDER — POTASSIUM CHLORIDE 20 MEQ/15ML (10%) PO SOLN
10.0000 meq | Freq: Two times a day (BID) | ORAL | Status: DC
  Administered 2019-02-26 – 2019-03-03 (×10): 10 meq via ORAL
  Filled 2019-02-26 (×13): qty 15

## 2019-02-26 MED ORDER — ENOXAPARIN SODIUM 40 MG/0.4ML ~~LOC~~ SOLN
40.0000 mg | SUBCUTANEOUS | Status: DC
  Administered 2019-02-26 – 2019-03-01 (×4): 40 mg via SUBCUTANEOUS
  Filled 2019-02-26 (×4): qty 0.4

## 2019-02-26 MED ORDER — POTASSIUM CHLORIDE 10 MEQ/100ML IV SOLN
10.0000 meq | INTRAVENOUS | Status: DC
  Administered 2019-02-26 (×2): 10 meq via INTRAVENOUS
  Filled 2019-02-26 (×3): qty 100

## 2019-02-26 NOTE — Plan of Care (Signed)

## 2019-02-26 NOTE — Progress Notes (Signed)
Subjective: 5 Days Post-Op Procedure(s) (LRB): LEFT HIP HEMIARTHROPLASTY (Left)   Patient is alert although confused.  No obvious complaints.  Seems to have eaten a fair amount of her lunch.  Potassium is slightly low.  Oral replacement ordered.  Patient reports pain as mild.  Objective:   VITALS:   Vitals:   02/26/19 0742 02/26/19 0752  BP: (!) 152/50   Pulse: 64   Resp:    Temp: 98.8 F (37.1 C)   SpO2: (!) 86% 93%    Neurologically intact ABD soft Neurovascular intact Sensation intact distally Intact pulses distally Dorsiflexion/Plantar flexion intact Incision: dressing C/D/I  LABS Recent Labs    02/24/19 0459  HGB 8.1*  HCT 26.3*  WBC 7.8  PLT 243    Recent Labs    02/24/19 0459 02/25/19 0444 02/26/19 0604  NA 147* 150* 148*  K 3.7 3.6 3.3*  BUN 39* 36* 34*  CREATININE 2.20* 2.20* 2.08*  GLUCOSE 169* 122* 126*    No results for input(s): LABPT, INR in the last 72 hours.   Assessment/Plan: 5 Days Post-Op Procedure(s) (LRB): LEFT HIP HEMIARTHROPLASTY (Left)   Advance diet Discharge to SNF

## 2019-02-26 NOTE — Progress Notes (Signed)
Physical Therapy Treatment Patient Details Name: Sabrina Burke MRN: [id number] DOB: 1927-08-13 Today's Date: 02/26/2019    History of Present Illness Pt admitted for L hip fracture s/p falling out of bed at ALF. Pt is now s/p L hip hemiarthroplasty, post approach on 02/21/19. Has history of dementia and HTN.    PT Comments    Pt was supine (quarter turned L) with abduction pillow between knees and SCDs applied. Pt is alert but confused throughout session. Only oriented to self however did follow simple one step commands better this date but inconsistently. She was able to perform a few exercises while supine in bed (noted below) and agreeable to trial EOB sitting however with total assist to progress to EOB becomes agitated and begins yelling out. Only able to sit EOB x 20 sec prior to total assist returning to supine (HOB elevated). PT will continue to progress as able. Pt is non ambulatory at baseline.   D/C disposition is still being determined. Pt would benefit from continued PT to return to PLOF. If pt plans to return to [facility], will need HHPT. SNF is appropriate if unable.     Follow Up Recommendations  SNF;Other (comment)(HHPT if she returns to [facility] house)     Equipment Recommendations  Hospital bed    Recommendations for Other Services       Precautions / Restrictions Precautions Precautions: Fall;Posterior Hip Precaution Booklet Issued: No Required Braces or Orthoses: Other Brace(abduction pillow) Restrictions Weight Bearing Restrictions: Yes LLE Weight Bearing: Weight bearing as tolerated    Mobility  Bed Mobility Overal bed mobility: Needs Assistance Bed Mobility: Supine to Sit;Sit to Supine     Supine to sit: Total assist Sit to supine: Total assist   General bed mobility comments: Pt agrees to sit EOB however only tolerated ~ 20 sec. of EOB sitting 2/2 to pain and becoming aggitated. Total assist to get to/from EOB with therapist supporting  BLEs/trunk  Transfers                    Ambulation/Gait                 Stairs             Wheelchair Mobility    Modified Rankin (Stroke Patients Only)       Balance Overall balance assessment: History of Falls Sitting-balance support: Bilateral upper extremity supported;Feet supported Sitting balance-Leahy Scale: Zero Sitting balance - Comments: pt with severe posterior push for the short amount of time sitting EOB.                                     Cognition Arousal/Alertness: Awake/alert Behavior During Therapy: Agitated Overall Cognitive Status: History of cognitive impairments - at baseline                                 General Comments: Pt is alert however continues to present with cogntion deficits that limit progression with PT. she was able to follow more commands this date however continues to be inconsistant.      Exercises General Exercises - Lower Extremity Ankle Circles/Pumps: AROM;Both;5 reps;Supine Quad Sets: AAROM;Both;5 reps;Supine Heel Slides: AAROM;Left;10 reps;Supine Hip ABduction/ADduction: PROM;5 reps;Left(unable to perform AROM/AAROM)    General Comments        Pertinent Vitals/Pain Pain Assessment: No/denies pain Pain  Score: 0-No pain Faces Pain Scale: Hurts little more Pain Location: knee/hip LLE with movement Pain Descriptors / Indicators: Grimacing;Sharp;Shooting Pain Intervention(s): Repositioned;Limited activity within patient's tolerance;Monitored during session    Home Living                      Prior Function            PT Goals (current goals can now be found in the care plan section) Acute Rehab PT Goals Patient Stated Goal: Pt unable to state Progress towards PT goals: Not progressing toward goals - comment(cognition/ pain limiting progress with PT)    Frequency    7X/week      PT Plan Current plan remains appropriate    Co-evaluation               AM-PAC PT "6 Clicks" Mobility   Outcome Measure  Help needed turning from your back to your side while in a flat bed without using bedrails?: Total Help needed moving from lying on your back to sitting on the side of a flat bed without using bedrails?: Total Help needed moving to and from a bed to a chair (including a wheelchair)?: Total Help needed standing up from a chair using your arms (e.g., wheelchair or bedside chair)?: Total Help needed to walk in hospital room?: Total Help needed climbing 3-5 steps with a railing? : Total 6 Click Score: 6    End of Session   Activity Tolerance: Patient limited by pain;Treatment limited secondary to agitation Patient left: in bed;with call bell/phone within reach;with bed alarm set;with SCD's reapplied;Other (comment)(abduction pillow reapplied) Nurse Communication: Mobility status PT Visit Diagnosis: Muscle weakness (generalized) (M62.81);History of falling (Z91.81)     Time: 5638-7564 PT Time Calculation (min) (ACUTE ONLY): 15 min  Charges:  $Therapeutic Activity: 8-22 mins                     Julaine Fusi PTA 02/26/19, 1:51 PM

## 2019-02-26 NOTE — Progress Notes (Signed)
CSW spoke with daughter (legal guardian) regarding discharge planning and disposition. Daughter expressed concerns regarding patient's disposition. She reports she has received conflicting treatment recommendations regarding SNF, or if patient is recommended to return to her ALF.   Daughter is aware of SNF bed offers. If SNF is determined for discharge plan, daughter would accept the bed offer for [facility].   Daughter reports that staff from patient's ALF, [facility], need to come in person to evaluate patient's appropriateness to return in regard to care needs. This evaluation has not yet been completed, potentially due to a COVID outbreak at the facility.   Daughter also requested orders to be placed for a hospital bed with rails, in an effort to prevent further falls and injuries.  Enid Cutter, LCSW-A Clinical Social Worker

## 2019-02-26 NOTE — Progress Notes (Addendum)
PROGRESS NOTE    Sabrina Burke  TDV:[id number] DOB: [birth year] DOA: 02/20/2019 PCP: Devoria Glassing, NP    Brief Narrative:  Sabrina Burke  is a 86 y.o. pleasantly demented Caucasian female with a known history of dementia and hypertension, who presented to the emergency room with acute onset of left hip pain after having a mechanical fall at [facility].    Consultants:   Ortho  Procedures:  S/P left hip hemiarthroplasty on 02/21/19  Antimicrobials:   Zosyn x5 days completed 02/25/2019   Subjective: Sitting up in bed.  Denies any pain, shortness of breath, abdominal pain or any other complaints.  MS at baseline  Objective: Vitals:   02/25/19 2102 02/25/19 2334 02/26/19 0742 02/26/19 0752  BP: 134/68 (!) 127/56 (!) 152/50   Pulse: 71 68 64   Resp: 18 18    Temp:  98.6 F (37 C) 98.8 F (37.1 C)   TempSrc:  Oral Oral   SpO2: 91% 94% (!) 86% 93%  Weight:      Height:        Intake/Output Summary (Last 24 hours) at 02/26/2019 1138 Last data filed at 02/26/2019 0500 Gross per 24 hour  Intake 656.64 ml  Output 300 ml  Net 356.64 ml   Filed Weights   02/20/19 1951  Weight: 45.4 kg    Examination:  General exam: Appears calm and comfortable , nad, sitting up in bed Respiratory system: Clear to auscultation   No w/r/r Cardiovascular system: S1 & S2 heard, RRR. No  murmurs, rubs, gallops or clicks.  Gastrointestinal system: Abdomen is nondistended, soft and nontender.  Normal bowel sounds heard.  No guarding Central nervous system: awake, not oriented at all, confused, pleasant Extremities:no edema, wedge between legs Skin: warm, dry .     Data Reviewed: I have personally reviewed following labs and imaging studies  CBC: Recent Labs  Lab 02/20/19 2053 02/21/19 0442 02/22/19 0419 02/23/19 0553 02/24/19 0459  WBC 13.2* 10.7* 8.9 9.7 7.8  NEUTROABS 9.4*  --   --   --   --   HGB 10.3* 9.7* 8.6* 8.1* 8.1*  HCT 32.0* 31.1* 26.9*  26.4* 26.3*  MCV 101.3* 103.3* 103.1* 106.0* 105.2*  PLT 266 196 205 218 243   Basic Metabolic Panel: Recent Labs  Lab 02/22/19 0419 02/23/19 0553 02/24/19 0459 02/25/19 0444 02/26/19 0604  NA 147* 149* 147* 150* 148*  K 3.5 3.6 3.7 3.6 3.3*  CL 114* 115* 113* 116* 115*  CO2 24 27 26 26 23   GLUCOSE 184* 150* 169* 122* 126*  BUN 36* 39* 39* 36* 34*  CREATININE 1.63* 2.17* 2.20* 2.20* 2.08*  CALCIUM 8.2* 8.3* 8.4* 8.5* 8.3*   GFR: Estimated Creatinine Clearance: 13.1 mL/min (A) (by C-G formula based on SCr of 2.08 mg/dL (H)). Liver Function Tests: Recent Labs  Lab 02/20/19 2053  AST 27  ALT 22  ALKPHOS 77  BILITOT 0.6  PROT 7.2  ALBUMIN 2.9*   No results for input(s): LIPASE, AMYLASE in the last 168 hours. No results for input(s): AMMONIA in the last 168 hours. Coagulation Profile: Recent Labs  Lab 02/20/19 2053  INR 1.1   Cardiac Enzymes: No results for input(s): CKTOTAL, CKMB, CKMBINDEX, TROPONINI in the last 168 hours. BNP (last 3 results) No results for input(s): PROBNP in the last 8760 hours. HbA1C: No results for input(s): HGBA1C in the last 72 hours. CBG: Recent Labs  Lab 02/20/19 1955  GLUCAP 151*   Lipid Profile: No  results for input(s): CHOL, HDL, LDLCALC, TRIG, CHOLHDL, LDLDIRECT in the last 72 hours. Thyroid Function Tests: No results for input(s): TSH, T4TOTAL, FREET4, T3FREE, THYROIDAB in the last 72 hours. Anemia Panel: No results for input(s): VITAMINB12, FOLATE, FERRITIN, TIBC, IRON, RETICCTPCT in the last 72 hours. Sepsis Labs: Recent Labs  Lab 02/20/19 2053  LATICACIDVEN 1.1    Recent Results (from the past 240 hour(s))  Respiratory Panel by RT PCR (Flu A&B, Covid) - Nasopharyngeal Swab     Status: None   Collection Time: 02/20/19  8:36 PM   Specimen: Nasopharyngeal Swab  Result Value Ref Range Status   SARS Coronavirus 2 by RT PCR NEGATIVE NEGATIVE Final    Comment: (NOTE) SARS-CoV-2 target nucleic acids are NOT  DETECTED. The SARS-CoV-2 RNA is generally detectable in upper respiratoy specimens during the acute phase of infection. The lowest concentration of SARS-CoV-2 viral copies this assay can detect is 131 copies/mL. A negative result does not preclude SARS-Cov-2 infection and should not be used as the sole basis for treatment or other patient management decisions. A negative result may occur with  improper specimen collection/handling, submission of specimen other than nasopharyngeal swab, presence of viral mutation(s) within the areas targeted by this assay, and inadequate number of viral copies (<131 copies/mL). A negative result must be combined with clinical observations, patient history, and epidemiological information. The expected result is Negative. Fact Sheet for Patients:  PinkCheek.be Fact Sheet for Healthcare Providers:  GravelBags.it This test is not yet ap proved or cleared by the [place] FDA and  has been authorized for detection and/or diagnosis of SARS-CoV-2 by FDA under an Emergency Use Authorization (EUA). This EUA will remain  in effect (meaning this test can be used) for the duration of the COVID-19 declaration under Section 564(b)(1) of the Act, 21 U.S.C. section 360bbb-3(b)(1), unless the authorization is terminated or revoked sooner.    Influenza A by PCR NEGATIVE NEGATIVE Final   Influenza B by PCR NEGATIVE NEGATIVE Final    Comment: (NOTE) The Xpert Xpress SARS-CoV-2/FLU/RSV assay is intended as an aid in  the diagnosis of influenza from Nasopharyngeal swab specimens and  should not be used as a sole basis for treatment. Nasal washings and  aspirates are unacceptable for Xpert Xpress SARS-CoV-2/FLU/RSV  testing. Fact Sheet for Patients: PinkCheek.be Fact Sheet for Healthcare Providers: GravelBags.it This test is not yet approved or cleared  by the [place] FDA and  has been authorized for detection and/or diagnosis of SARS-CoV-2 by  FDA under an Emergency Use Authorization (EUA). This EUA will remain  in effect (meaning this test can be used) for the duration of the  Covid-19 declaration under Section 564(b)(1) of the Act, 21  U.S.C. section 360bbb-3(b)(1), unless the authorization is  terminated or revoked. Performed at [facility], [address] [place], [place] [address]   Blood culture (routine x 2)     Status: None   Collection Time: 02/20/19  8:53 PM   Specimen: BLOOD  Result Value Ref Range Status   Specimen Description BLOOD RIGHT ANTECUBITAL  Final   Special Requests   Final    BOTTLES DRAWN AEROBIC AND ANAEROBIC Blood Culture results may not be optimal due to an inadequate volume of blood received in culture bottles   Culture   Final    NO GROWTH 5 DAYS Performed at [facility], [address], [place], [place] [address]    Report Status 02/25/2019 FINAL  Final  Blood culture (routine  x 2)     Status: None   Collection Time: 02/20/19  8:53 PM   Specimen: BLOOD  Result Value Ref Range Status   Specimen Description BLOOD BLOOD LEFT HAND  Final   Special Requests   Final    BOTTLES DRAWN AEROBIC AND ANAEROBIC Blood Culture adequate volume   Culture   Final    NO GROWTH 5 DAYS Performed at [facility], [address] [place], [place] [address]    Report Status 02/25/2019 FINAL  Final  Urine Culture     Status: Abnormal   Collection Time: 02/20/19 11:44 PM   Specimen: Urine, Clean Catch  Result Value Ref Range Status   Specimen Description   Final    URINE, CLEAN CATCH Performed at [facility], [address], [place], [place] [address]    Special Requests   Final    NONE Performed at [facility], [address] [place], [place] [address]    Culture >=100,000 COLONIES/mL ESCHERICHIA COLI (A)  Final   Report Status 02/22/2019 FINAL  Final    Organism ID, Bacteria ESCHERICHIA COLI (A)  Final      Susceptibility   Escherichia coli - MIC*    AMPICILLIN 8 SENSITIVE Sensitive     CEFAZOLIN <=4 SENSITIVE Sensitive     CEFTRIAXONE <=0.25 SENSITIVE Sensitive     CIPROFLOXACIN <=0.25 SENSITIVE Sensitive     GENTAMICIN <=1 SENSITIVE Sensitive     IMIPENEM <=0.25 SENSITIVE Sensitive     NITROFURANTOIN <=16 SENSITIVE Sensitive     TRIMETH/SULFA <=20 SENSITIVE Sensitive     AMPICILLIN/SULBACTAM <=2 SENSITIVE Sensitive     PIP/TAZO <=4 SENSITIVE Sensitive     * >=100,000 COLONIES/mL ESCHERICHIA COLI  Surgical pcr screen     Status: None   Collection Time: 02/21/19 12:50 AM   Specimen: Nasal Mucosa; Nasal Swab  Result Value Ref Range Status   MRSA, PCR NEGATIVE NEGATIVE Final   Staphylococcus aureus NEGATIVE NEGATIVE Final    Comment: (NOTE) The Xpert SA Assay (FDA approved for NASAL specimens in patients 5 years of age and older), is one component of a comprehensive surveillance program. It is not intended to diagnose infection nor to guide or monitor treatment. Performed at [facility], [address] [place], [place] [address]   CULTURE, BLOOD (ROUTINE X 2) w Reflex to ID Panel     Status: None (Preliminary result)   Collection Time: 02/22/19  1:02 PM   Specimen: BLOOD  Result Value Ref Range Status   Specimen Description BLOOD BLOOD RIGHT HAND  Final   Special Requests   Final    BOTTLES DRAWN AEROBIC AND ANAEROBIC Blood Culture results may not be optimal due to an inadequate volume of blood received in culture bottles   Culture   Final    NO GROWTH 4 DAYS Performed at [facility], [address], [place], [place] [address]    Report Status PENDING  Incomplete  CULTURE, BLOOD (ROUTINE X 2) w Reflex to ID Panel     Status: None (Preliminary result)   Collection Time: 02/22/19  1:08 PM   Specimen: BLOOD  Result Value Ref Range Status   Specimen Description BLOOD RIGHT ANTECUBITAL  Final    Special Requests   Final    BOTTLES DRAWN AEROBIC AND ANAEROBIC Blood Culture adequate volume   Culture   Final    NO GROWTH 4 DAYS Performed at [facility], [address], [place], [place] [address]  Report Status PENDING  Incomplete         Radiology Studies: No results found.      Scheduled Meds: . Chlorhexidine Gluconate Cloth  6 each Topical Daily  . cholecalciferol  2,000 Units Oral Daily  . famotidine  20 mg Oral QHS  . ferrous sulfate  325 mg Oral TID PC  . heparin injection (subcutaneous)  5,000 Units Subcutaneous Q8H  . levothyroxine  88 mcg Oral Q0600  . LORazepam  0.25 mg Oral BID  . metoprolol tartrate  12.5 mg Oral BID  . multivitamin with minerals   Oral Daily  . senna  1 tablet Oral BID  . traMADol  50 mg Oral Q6H   Continuous Infusions: . sodium chloride 50 mL/hr at 02/25/19 1148  . methocarbamol (ROBAXIN) IV    . potassium chloride 10 mEq (02/26/19 0819)    Assessment & Plan:   Active Problems:   Closed left hip fracture (HCC)   Fever   Protein-calorie malnutrition, severe   Essential hypertension   Acquired hypothyroidism   1. Left hip fracture- secondary to mechanical fall: S/P  left hip hemiarthroplasty; POD 4 - Pain management   - Cont IV hydration  - PT/OT per Ortho -rec. 24 hr supervision,home health. -Palliative care consulted, will f/u -speech swallow consult -weightbearing as tolerated on the left lower extremity with posterior hip precautions.  -DC  from an orthopedic standpoint  -ortho recommend patient be discharged on Lovenox 40 mg daily next 14 days for DVT prophylaxis  2. UTI with subsequent sepsis:  With leukocytosis and fever On IV Zosyn since there was thought of UTI and aspiration pneumonia...>completed 5 day course -Urine culture grew E. Coli -Chest x-ray from 2/2 did not reveal aspiration Blood cultures TD negative  3.Hypernatremia-Etiology unclear. Possibly 2/2 solutes from ivf, was switch to  1/2ns and improving now. Continue to monitor.   4. Hypertensive urgency: Resolved now  - switched iv beta blk to po - continue amlodipine. Improving.  5. Acute kidney injury superimposed on stage IIIb chronic kidney disease.-- Improving following hydration  - Cont IV normal  - creatinine up but hopefully stabilizing - Avoid potential nephrotoxins  6. Hypothyroidism. -- TSH WNL  - Continue home Synthroid, switch from iv to po   7.Nutrition- has poor intake Encouraged po intact with assistance  8. Hypokalemia-will replace. Monitor levels -    DVT prophylaxis: Heparin  code Status:DNR Family Communication: none at bedside Disposition Plan: needs covid testing prior to going back to her residence home and however we are unsure of when they will accept her.  Per social work [facility] had a Covid outbreak and their RN will need to come and evaluate prior to return.  We will monitor her sodium level today it if improves over the weekend possible DC on Sunday or Monday depending on labs and evaluation from [facility].  LOS: 6 days   Time spent: 45 min with more than 50% on coc    Lynn Ito, MD [facility] Pager [phone]  If 7PM-7AM, please contact night-coverage [email or web address] Password [facility] 02/26/2019, 11:38 AM Patient ID: Sharita Bienaime, female   DOB: [birth year], 86 y.o.   MRN: [id number]

## 2019-02-27 LAB — BASIC METABOLIC PANEL
Anion gap: 8 (ref 5–15)
BUN: 34 mg/dL — ABNORMAL HIGH (ref 8–23)
CO2: 25 mmol/L (ref 22–32)
Calcium: 8.4 mg/dL — ABNORMAL LOW (ref 8.9–10.3)
Chloride: 118 mmol/L — ABNORMAL HIGH (ref 98–111)
Creatinine, Ser: 2.17 mg/dL — ABNORMAL HIGH (ref 0.44–1.00)
GFR calc Af Amer: 23 mL/min — ABNORMAL LOW (ref >60)
GFR calc non Af Amer: 20 mL/min — ABNORMAL LOW (ref >60)
Glucose, Bld: 111 mg/dL — ABNORMAL HIGH (ref 70–99)
Potassium: 3.9 mmol/L (ref 3.5–5.1)
Sodium: 151 mmol/L — ABNORMAL HIGH (ref 135–145)

## 2019-02-27 LAB — CULTURE, BLOOD (ROUTINE X 2)
Culture: NO GROWTH
Culture: NO GROWTH
Special Requests: ADEQUATE

## 2019-02-27 LAB — HEMOGLOBIN AND HEMATOCRIT, BLOOD
HCT: 27.7 % — ABNORMAL LOW (ref 36.0–46.0)
Hemoglobin: 8.3 g/dL — ABNORMAL LOW (ref 12.0–15.0)

## 2019-02-27 NOTE — Progress Notes (Signed)
Physical Therapy Treatment Patient Details Name: Sabrina Burke MRN: [id number] DOB: [birth year] Today's Date: 02/27/2019    History of Present Illness Pt admitted for L hip fracture s/p falling out of bed at ALF. Pt is now s/p L hip hemiarthroplasty, post approach on 02/21/19. Has history of dementia and HTN.    PT Comments    ABD pillow removed for exercises below.  Pt resists at times and will often state "no" or ask if I "can't you find something else to do".  Session limited to ex today.  Follow Up Recommendations  SNF;Other (comment)     Equipment Recommendations  Hospital bed    Recommendations for Other Services       Precautions / Restrictions Precautions Precautions: Fall;Posterior Hip Precaution Booklet Issued: No Restrictions Weight Bearing Restrictions: Yes LLE Weight Bearing: Weight bearing as tolerated    Mobility  Bed Mobility               General bed mobility comments: deferred this session - unable to coordinate care  Transfers                    Ambulation/Gait                 Stairs             Wheelchair Mobility    Modified Rankin (Stroke Patients Only)       Balance                                            Cognition Arousal/Alertness: Awake/alert Behavior During Therapy: Agitated Overall Cognitive Status: History of cognitive impairments - at baseline                                 General Comments: Pt is alert however continues to present with cogntion deficits that limit progression with PT.      Exercises Other Exercises Other Exercises: BLE P/AAROM 2 x 10 heel slides, ab/add, SLR and ankle pumps.    General Comments        Pertinent Vitals/Pain Pain Assessment: No/denies pain Faces Pain Scale: Hurts little more Pain Location: knee/hip LLE with movement Pain Descriptors / Indicators: Grimacing;Sharp;Shooting Pain Intervention(s): Limited activity within  patient's tolerance;Monitored during session;Repositioned    Home Living                      Prior Function            PT Goals (current goals can now be found in the care plan section) Progress towards PT goals: Not progressing toward goals - comment    Frequency    7X/week      PT Plan Current plan remains appropriate    Co-evaluation              AM-PAC PT "6 Clicks" Mobility   Outcome Measure  Help needed turning from your back to your side while in a flat bed without using bedrails?: Total Help needed moving from lying on your back to sitting on the side of a flat bed without using bedrails?: Total Help needed moving to and from a bed to a chair (including a wheelchair)?: Total Help needed standing up from a chair using your arms (e.g., wheelchair or bedside chair)?: Total  Help needed to walk in hospital room?: Total Help needed climbing 3-5 steps with a railing? : Total 6 Click Score: 6    End of Session   Activity Tolerance: Patient limited by pain;Treatment limited secondary to agitation Patient left: in bed;with call bell/phone within reach;with bed alarm set;with SCD's reapplied;Other (comment) Nurse Communication: Mobility status       Time: 8887-5797 PT Time Calculation (min) (ACUTE ONLY): 9 min  Charges:  $Therapeutic Exercise: 8-22 mins                    Danielle Dess, PTA 02/27/19, 10:40 AM

## 2019-02-27 NOTE — TOC Progression Note (Signed)
Transition of Care [facility]) - Progression Note    Patient Details  Name: Sabrina Burke MRN: [id number] Date of Birth: 14-Feb-1927  Transition of Care [facility]) CM/SW Contact  Delphia Grates, [place] Phone Number: 02/27/2019, 12:45 PM  Clinical Narrative:     CSW called [facility] regarding if someone will come do an evaluation on the patient to be able to go to [facility]. Patient should be evaluation on Monday to be able to possible go to [facility].   Expected Discharge Plan: Skilled Nursing Facility Barriers to Discharge: Continued Medical Work up  Expected Discharge Plan and Services Expected Discharge Plan: Skilled Nursing Facility In-house Referral: Clinical Social Work Discharge Planning Services: NA   Living arrangements for the past 2 months: Assisted Living Facility([facility])                                       Social Determinants of Health (SDOH) Interventions    Readmission Risk Interventions No flowsheet data found.

## 2019-02-27 NOTE — Progress Notes (Signed)
Subjective: 6 Days Post-Op Procedure(s) (LRB): LEFT HIP HEMIARTHROPLASTY (Left)   Patient is alert if not oriented.  The left leg is comfortable and stable.  Wounds look good.  Potassium is up to 3.9 today.  Patient reports pain as mild.  Objective:   VITALS:   Vitals:   02/27/19 0803 02/27/19 1551  BP: (!) 173/69 (!) 169/62  Pulse: 70 76  Resp: 14 15  Temp: 98.1 F (36.7 C) 99.4 F (37.4 C)  SpO2: 100% 99%    Neurologically intact ABD soft Neurovascular intact Sensation intact distally Intact pulses distally Dorsiflexion/Plantar flexion intact Incision: dressing C/D/I  LABS Recent Labs    02/27/19 0716  HGB 8.3*  HCT 27.7*    Recent Labs    02/25/19 0444 02/26/19 0604 02/27/19 0716  NA 150* 148* 151*  K 3.6 3.3* 3.9  BUN 36* 34* 34*  CREATININE 2.20* 2.08* 2.17*  GLUCOSE 122* 126* 111*    No results for input(s): LABPT, INR in the last 72 hours.   Assessment/Plan: 6 Days Post-Op Procedure(s) (LRB): LEFT HIP HEMIARTHROPLASTY (Left)   Advance diet Up with therapy Discharge to SNF   Return to clinic in 2 weeks.  Lovenox for DVT prophylaxis for 2 weeks.

## 2019-02-27 NOTE — Progress Notes (Signed)
PROGRESS NOTE    Sabrina Burke  HYI:[id number] DOB: July 21, 1927 DOA: 02/20/2019 PCP: Devoria Glassing, NP    Brief Narrative:  Sabrina Burke  is a 86 y.o. pleasantly demented Caucasian female with a known history of dementia and hypertension, who presented to the emergency room with acute onset of left hip pain after having a mechanical fall at [facility].    Consultants:   Ortho  Procedures:  S/P left hip hemiarthroplasty on 02/21/19  Antimicrobials:   Zosyn x5 days completed 02/25/2019   Subjective: Sitting up in bed.  Denies any pain, shortness of breath, abdominal pain or any other complaints.  MS at baseline  Objective: Vitals:   02/26/19 0752 02/26/19 1447 02/26/19 2312 02/27/19 0803  BP:  127/65 (!) 142/49 (!) 173/69  Pulse:  67 73 70  Resp:   17 14  Temp:  99.1 F (37.3 C) 98.6 F (37 C) 98.1 F (36.7 C)  TempSrc:  Oral  Axillary  SpO2: 93% 95% 92% 100%  Weight:      Height:        Intake/Output Summary (Last 24 hours) at 02/27/2019 1138 Last data filed at 02/27/2019 0400 Gross per 24 hour  Intake --  Output 450 ml  Net -450 ml   Filed Weights   02/20/19 1951  Weight: 45.4 kg    Examination:  General exam: Appears calm and comfortable ,  sitting up in bed  Respiratory system: Clear to auscultation   No w/r/r Cardiovascular system: S1 & S2 heard, RRR. No  murmurs, rubs, gallops or clicks.  Gastrointestinal system: Abdomen is nondistended, soft and nontender.  Normal bowel sounds heard.   Central nervous system: awake,confused, pleasant Extremities:no edema, wedge between legs Skin: warm, dry .     Data Reviewed: I have personally reviewed following labs and imaging studies  CBC: Recent Labs  Lab 02/20/19 2053 02/20/19 2053 02/21/19 0442 02/22/19 0419 02/23/19 0553 02/24/19 0459 02/27/19 0716  WBC 13.2*  --  10.7* 8.9 9.7 7.8  --   NEUTROABS 9.4*  --   --   --   --   --   --   HGB 10.3*   < > 9.7* 8.6* 8.1*  8.1* 8.3*  HCT 32.0*   < > 31.1* 26.9* 26.4* 26.3* 27.7*  MCV 101.3*  --  103.3* 103.1* 106.0* 105.2*  --   PLT 266  --  196 205 218 243  --    < > = values in this interval not displayed.   Basic Metabolic Panel: Recent Labs  Lab 02/23/19 0553 02/24/19 0459 02/25/19 0444 02/26/19 0604 02/27/19 0716  NA 149* 147* 150* 148* 151*  K 3.6 3.7 3.6 3.3* 3.9  CL 115* 113* 116* 115* 118*  CO2 27 26 26 23 25   GLUCOSE 150* 169* 122* 126* 111*  BUN 39* 39* 36* 34* 34*  CREATININE 2.17* 2.20* 2.20* 2.08* 2.17*  CALCIUM 8.3* 8.4* 8.5* 8.3* 8.4*   GFR: Estimated Creatinine Clearance: 12.6 mL/min (A) (by C-G formula based on SCr of 2.17 mg/dL (H)). Liver Function Tests: Recent Labs  Lab 02/20/19 2053  AST 27  ALT 22  ALKPHOS 77  BILITOT 0.6  PROT 7.2  ALBUMIN 2.9*   No results for input(s): LIPASE, AMYLASE in the last 168 hours. No results for input(s): AMMONIA in the last 168 hours. Coagulation Profile: Recent Labs  Lab 02/20/19 2053  INR 1.1   Cardiac Enzymes: No results for input(s): CKTOTAL, CKMB, CKMBINDEX, TROPONINI  in the last 168 hours. BNP (last 3 results) No results for input(s): PROBNP in the last 8760 hours. HbA1C: No results for input(s): HGBA1C in the last 72 hours. CBG: Recent Labs  Lab 02/20/19 1955  GLUCAP 151*   Lipid Profile: No results for input(s): CHOL, HDL, LDLCALC, TRIG, CHOLHDL, LDLDIRECT in the last 72 hours. Thyroid Function Tests: No results for input(s): TSH, T4TOTAL, FREET4, T3FREE, THYROIDAB in the last 72 hours. Anemia Panel: No results for input(s): VITAMINB12, FOLATE, FERRITIN, TIBC, IRON, RETICCTPCT in the last 72 hours. Sepsis Labs: Recent Labs  Lab 02/20/19 2053  LATICACIDVEN 1.1    Recent Results (from the past 240 hour(s))  Respiratory Panel by RT PCR (Flu A&B, Covid) - Nasopharyngeal Swab     Status: None   Collection Time: 02/20/19  8:36 PM   Specimen: Nasopharyngeal Swab  Result Value Ref Range Status   SARS  Coronavirus 2 by RT PCR NEGATIVE NEGATIVE Final    Comment: (NOTE) SARS-CoV-2 target nucleic acids are NOT DETECTED. The SARS-CoV-2 RNA is generally detectable in upper respiratoy specimens during the acute phase of infection. The lowest concentration of SARS-CoV-2 viral copies this assay can detect is 131 copies/mL. A negative result does not preclude SARS-Cov-2 infection and should not be used as the sole basis for treatment or other patient management decisions. A negative result may occur with  improper specimen collection/handling, submission of specimen other than nasopharyngeal swab, presence of viral mutation(s) within the areas targeted by this assay, and inadequate number of viral copies (<131 copies/mL). A negative result must be combined with clinical observations, patient history, and epidemiological information. The expected result is Negative. Fact Sheet for Patients:  PinkCheek.be Fact Sheet for Healthcare Providers:  GravelBags.it This test is not yet ap proved or cleared by the [place] FDA and  has been authorized for detection and/or diagnosis of SARS-CoV-2 by FDA under an Emergency Use Authorization (EUA). This EUA will remain  in effect (meaning this test can be used) for the duration of the COVID-19 declaration under Section 564(b)(1) of the Act, 21 U.S.C. section 360bbb-3(b)(1), unless the authorization is terminated or revoked sooner.    Influenza A by PCR NEGATIVE NEGATIVE Final   Influenza B by PCR NEGATIVE NEGATIVE Final    Comment: (NOTE) The Xpert Xpress SARS-CoV-2/FLU/RSV assay is intended as an aid in  the diagnosis of influenza from Nasopharyngeal swab specimens and  should not be used as a sole basis for treatment. Nasal washings and  aspirates are unacceptable for Xpert Xpress SARS-CoV-2/FLU/RSV  testing. Fact Sheet for Patients: PinkCheek.be Fact Sheet  for Healthcare Providers: GravelBags.it This test is not yet approved or cleared by the [place] FDA and  has been authorized for detection and/or diagnosis of SARS-CoV-2 by  FDA under an Emergency Use Authorization (EUA). This EUA will remain  in effect (meaning this test can be used) for the duration of the  Covid-19 declaration under Section 564(b)(1) of the Act, 21  U.S.C. section 360bbb-3(b)(1), unless the authorization is  terminated or revoked. Performed at [facility], [address] [place], [place] [address]   Blood culture (routine x 2)     Status: None   Collection Time: 02/20/19  8:53 PM   Specimen: BLOOD  Result Value Ref Range Status   Specimen Description BLOOD RIGHT ANTECUBITAL  Final   Special Requests   Final    BOTTLES DRAWN AEROBIC AND ANAEROBIC Blood Culture results may not be optimal due to an inadequate  volume of blood received in culture bottles   Culture   Final    NO GROWTH 5 DAYS Performed at [facility], [address] [place], [place] [address]    Report Status 02/25/2019 FINAL  Final  Blood culture (routine x 2)     Status: None   Collection Time: 02/20/19  8:53 PM   Specimen: BLOOD  Result Value Ref Range Status   Specimen Description BLOOD BLOOD LEFT HAND  Final   Special Requests   Final    BOTTLES DRAWN AEROBIC AND ANAEROBIC Blood Culture adequate volume   Culture   Final    NO GROWTH 5 DAYS Performed at [facility], [address] [place], [place] [address]    Report Status 02/25/2019 FINAL  Final  Urine Culture     Status: Abnormal   Collection Time: 02/20/19 11:44 PM   Specimen: Urine, Clean Catch  Result Value Ref Range Status   Specimen Description   Final    URINE, CLEAN CATCH Performed at [facility], [address] [place], [place] [address]    Special Requests   Final    NONE Performed at [facility], [address] [place], [place]  [address]    Culture >=100,000 COLONIES/mL ESCHERICHIA COLI (A)  Final   Report Status 02/22/2019 FINAL  Final   Organism ID, Bacteria ESCHERICHIA COLI (A)  Final      Susceptibility   Escherichia coli - MIC*    AMPICILLIN 8 SENSITIVE Sensitive     CEFAZOLIN <=4 SENSITIVE Sensitive     CEFTRIAXONE <=0.25 SENSITIVE Sensitive     CIPROFLOXACIN <=0.25 SENSITIVE Sensitive     GENTAMICIN <=1 SENSITIVE Sensitive     IMIPENEM <=0.25 SENSITIVE Sensitive     NITROFURANTOIN <=16 SENSITIVE Sensitive     TRIMETH/SULFA <=20 SENSITIVE Sensitive     AMPICILLIN/SULBACTAM <=2 SENSITIVE Sensitive     PIP/TAZO <=4 SENSITIVE Sensitive     * >=100,000 COLONIES/mL ESCHERICHIA COLI  Surgical pcr screen     Status: None   Collection Time: 02/21/19 12:50 AM   Specimen: Nasal Mucosa; Nasal Swab  Result Value Ref Range Status   MRSA, PCR NEGATIVE NEGATIVE Final   Staphylococcus aureus NEGATIVE NEGATIVE Final    Comment: (NOTE) The Xpert SA Assay (FDA approved for NASAL specimens in patients 61 years of age and older), is one component of a comprehensive surveillance program. It is not intended to diagnose infection nor to guide or monitor treatment. Performed at [facility], [address] [place], [place] [address]   CULTURE, BLOOD (ROUTINE X 2) w Reflex to ID Panel     Status: None   Collection Time: 02/22/19  1:02 PM   Specimen: BLOOD  Result Value Ref Range Status   Specimen Description BLOOD BLOOD RIGHT HAND  Final   Special Requests   Final    BOTTLES DRAWN AEROBIC AND ANAEROBIC Blood Culture results may not be optimal due to an inadequate volume of blood received in culture bottles   Culture   Final    NO GROWTH 5 DAYS Performed at [facility], [address] [place], [place] [address]    Report Status 02/27/2019 FINAL  Final  CULTURE, BLOOD (ROUTINE X 2) w Reflex to ID Panel     Status: None   Collection Time: 02/22/19  1:08 PM   Specimen: BLOOD  Result Value Ref Range  Status   Specimen Description BLOOD RIGHT ANTECUBITAL  Final  Special Requests   Final    BOTTLES DRAWN AEROBIC AND ANAEROBIC Blood Culture adequate volume   Culture   Final    NO GROWTH 5 DAYS Performed at [facility], [address], [place], [place] [address]    Report Status 02/27/2019 FINAL  Final         Radiology Studies: No results found.      Scheduled Meds: . Chlorhexidine Gluconate Cloth  6 each Topical Daily  . cholecalciferol  2,000 Units Oral Daily  . enoxaparin (LOVENOX) injection  40 mg Subcutaneous Q24H  . famotidine  20 mg Oral QHS  . ferrous sulfate  325 mg Oral TID PC  . levothyroxine  88 mcg Oral Q0600  . LORazepam  0.25 mg Oral BID  . metoprolol tartrate  12.5 mg Oral BID  . multivitamin with minerals   Oral Daily  . potassium chloride  10 mEq Oral BID  . senna  1 tablet Oral BID  . traMADol  50 mg Oral Q6H   Continuous Infusions: . sodium chloride 50 mL/hr at 02/26/19 1508    Assessment & Plan:   Active Problems:   Closed left hip fracture (HCC)   Fever   Protein-calorie malnutrition, severe   Essential hypertension   Acquired hypothyroidism   1. Left hip fracture- secondary to mechanical fall: S/P  left hip hemiarthroplasty; POD 4 - Pain management   - Cont IV hydration  - PT/OT per Ortho -rec. 24 hr supervision,home health. -Palliative care consulted, will f/u -speech swallow consult -weightbearing as tolerated on the left lower extremity with posterior hip precautions.  -DC  from an orthopedic standpoint  -ortho recommend patient be discharged on Lovenox 40 mg daily next 14 days for DVT prophylaxis  2. UTI with subsequent sepsis:  With leukocytosis and fever On IV Zosyn since there was thought of UTI and aspiration pneumonia...>completed 5 day course -Urine culture grew E. Coli -Chest x-ray from 2/2 did not reveal aspiration Blood cultures TD negative  3.Hypernatremia-Etiology unclear. Possibly 2/2 solutes from  ivf, was switch to 1/2ns improved and now back up again. Will d/c ivf. Continue to monitor.   4. Hypertensive urgency: Resolved now  - switched iv beta blk to po - continue amlodipine. Improving.  5. Acute kidney injury superimposed on stage IIIb chronic kidney disease.-- Improved following hydration  - Cont IV normal  - creatinine stabilized, this likely the baseline - Avoid potential nephrotoxins  6. Hypothyroidism. -- TSH WNL  - Continue home Synthroid, switch from iv to po   7.Nutrition- has poor intake Encouraged po intact with assistance  8. Hypokalemia- Replace and resolved Monitor levels -    DVT prophylaxis: Heparin  code Status:DNR Family Communication: none at bedside Disposition Plan: needs covid testing prior to going back to her residence home and however we are unsure of when they will accept her.  Per social work [facility] had a Covid outbreak and their RN will need to come and evaluate prior to return.  Sodium level is elevated again we will need to monitor this overnight if stabilizes will DC her in a.m.     LOS: 7 days   Time spent: 45 min with more than 50% on coc    Lynn Ito, MD [facility] Pager [phone]  If 7PM-7AM, please contact night-coverage [email or web address] Password [facility] 02/27/2019, 11:38 AM Patient ID: Sabrina Burke, female   DOB: 1927/12/06, 86 y.o.   MRN: [id number]

## 2019-02-28 ENCOUNTER — Inpatient Hospital Stay: Payer: Medicare Other

## 2019-02-28 LAB — BASIC METABOLIC PANEL
Anion gap: 7 (ref 5–15)
BUN: 36 mg/dL — ABNORMAL HIGH (ref 8–23)
CO2: 26 mmol/L (ref 22–32)
Calcium: 8.6 mg/dL — ABNORMAL LOW (ref 8.9–10.3)
Chloride: 116 mmol/L — ABNORMAL HIGH (ref 98–111)
Creatinine, Ser: 2.08 mg/dL — ABNORMAL HIGH (ref 0.44–1.00)
GFR calc Af Amer: 24 mL/min — ABNORMAL LOW (ref >60)
GFR calc non Af Amer: 21 mL/min — ABNORMAL LOW (ref >60)
Glucose, Bld: 110 mg/dL — ABNORMAL HIGH (ref 70–99)
Potassium: 3.9 mmol/L (ref 3.5–5.1)
Sodium: 149 mmol/L — ABNORMAL HIGH (ref 135–145)

## 2019-02-28 LAB — CBC
HCT: 28.7 % — ABNORMAL LOW (ref 36.0–46.0)
Hemoglobin: 8.7 g/dL — ABNORMAL LOW (ref 12.0–15.0)
MCH: 32.1 pg (ref 26.0–34.0)
MCHC: 30.3 g/dL (ref 30.0–36.0)
MCV: 105.9 fL — ABNORMAL HIGH (ref 80.0–100.0)
Platelets: 367 10*3/uL (ref 150–400)
RBC: 2.71 MIL/uL — ABNORMAL LOW (ref 3.87–5.11)
RDW: 12.1 % (ref 11.5–15.5)
WBC: 7.8 10*3/uL (ref 4.0–10.5)
nRBC: 0 % (ref 0.0–0.2)

## 2019-02-28 LAB — URINALYSIS, COMPLETE (UACMP) WITH MICROSCOPIC
Bilirubin Urine: NEGATIVE
Glucose, UA: NEGATIVE mg/dL
Hgb urine dipstick: NEGATIVE
Ketones, ur: NEGATIVE mg/dL
Nitrite: NEGATIVE
Protein, ur: 30 mg/dL — AB
Specific Gravity, Urine: 1.012 (ref 1.005–1.030)
pH: 5 (ref 5.0–8.0)

## 2019-02-28 NOTE — TOC Progression Note (Signed)
Transition of Care [facility]) - Progression Note    Patient Details  Name: Sabrina Burke MRN: [id number] Date of Birth: 05-16-1927  Transition of Care [facility]) CM/SW Contact  Barrie Dunker, RN Phone Number: 02/28/2019, 9:18 AM  Clinical Narrative:    [facility] times, each time I am transferred and someone answered and when I explained I wanted to confirm someone was coming today to evaluate the patient to return to the facilty the person answering hangs up. I called back and asked to speak to the Administrator and was placed on a lengthy hold, The secretary came back on the line and stated that they were not accepting the transfer of the call from her either and that she would call Management and have someone call me back, I provided my contact information for a return call   Expected Discharge Plan: Skilled Nursing Facility Barriers to Discharge: Continued Medical Work up  Expected Discharge Plan and Services Expected Discharge Plan: Skilled Nursing Facility In-house Referral: Clinical Social Work Discharge Planning Services: NA   Living arrangements for the past 2 months: Assisted Living Facility([facility])                                       Social Determinants of Health (SDOH) Interventions    Readmission Risk Interventions No flowsheet data found.

## 2019-02-28 NOTE — Care Management Important Message (Signed)
Important Message  Patient Details  Name: Sabrina Burke MRN: [id number] Date of Birth: January 30, 1927   Medicare Important Message Given:  Yes     [name] 02/28/2019, 1:51 PM

## 2019-02-28 NOTE — TOC Progression Note (Signed)
Transition of Care [facility]) - Progression Note    Patient Details  Name: Sabrina Burke MRN: [id number] Date of Birth: 02-09-1927  Transition [facility]) CM/SW Contact  Barrie Dunker, RN Phone Number: 02/28/2019, 2:31 PM  Clinical Narrative:     Melody the administrator called and stated that they dont usually come to the hospital to do evaluation, I explained to her that the patient is non ambulatory at baseline, yes she did have a hip fracture but she is still non ambulatory as she was before the hip fracture.  She stated that she will look at the paper work and see if she needs to do an evaluation and she will call me back  I explained thew patient has been here since 1/31 and we need to know if she will be returning, the plan is to DC tomorrow and I need to know if she needs a covid test Expected Discharge Plan: Skilled Nursing Facility Barriers to Discharge: Continued Medical Work up  Expected Discharge Plan and Services Expected Discharge Plan: Skilled Nursing Facility In-house Referral: Clinical Social Work Discharge Planning Services: NA   Living arrangements for the past 2 months: Assisted Living Facility([facility])                                       Social Determinants of Health (SDOH) Interventions    Readmission Risk Interventions No flowsheet data found.

## 2019-02-28 NOTE — Progress Notes (Signed)
Nutrition Follow-up  DOCUMENTATION CODES:   Severe malnutrition in context of social or environmental circumstances, Underweight  INTERVENTION:  Provide Hormel Shake po BID with lunch and dinner, each supplement provides 520 kcal and 22 grams of protein.  NUTRITION DIAGNOSIS:   Severe Malnutrition related to social / environmental circumstances(dementia, advanced age, inadequate oral intake) as evidenced by severe fat depletion, severe muscle depletion.  Ongoing.  GOAL:   Patient will meet greater than or equal to 90% of their needs  Progressing.  MONITOR:   Diet advancement, Labs, Weight trends, Skin, I & O's  REASON FOR ASSESSMENT:   Consult Assessment of nutrition requirement/status  ASSESSMENT:   86 year old female with PMHx of dementia, HTN, CKD, hypothyroidism admitted after mechanical fall with left femoral neck fracture s/p left hip hemiarthroplasty on 2/1, UTI with sepsis, AKI on CKD stage III.  Diet was advanced to dysphagia 1 with nectar-thick liquids on 2/3. Patient unable to provide any details on oral intake. According to chart patient is eating 30-40% of her meals. She is receiving assistance at meals. Pending discharge plan.  Medications reviewed and include: vitamin D3 2000 units daily, famotidine, ferrous sulfate 325 mg TID, levothyroxine, Ativan, MVI daily, potassium chloride 10 mEq BID, senna 1 tablet BID.  Labs reviewed: Sodium 149, Chloride 116, BUN 36, Creatinine 2.08.  Diet Order:   Diet Order            DIET - DYS 1 Room service appropriate? Yes with Assist; Fluid consistency: Nectar Thick  Diet effective now             EDUCATION NEEDS:   No education needs have been identified at this time  Skin:  Skin Assessment: Skin Integrity Issues:(closed incision left hip)  Last BM:  02/25/2019  Height:   Ht Readings from Last 1 Encounters:  02/20/19 5\' 2"  (1.575 m)   Weight:   Wt Readings from Last 1 Encounters:  02/20/19 45.4 kg    Ideal Body Weight:  50 kg  BMI:  Body mass index is 18.29 kg/m.  Estimated Nutritional Needs:   Kcal:  1200-1400  Protein:  60-70 grams  Fluid:  1.2-1.4 L/day  02/22/19, MS, RD, LDN Pager number available on Amion

## 2019-02-28 NOTE — TOC Progression Note (Signed)
Transition of Care [facility]) - Progression Note    Patient Details  Name: Wiletta Bermingham MRN: [id number] Date of Birth: 07/01/1927  Transition of Care [facility]) CM/SW Contact  Barrie Dunker, RN Phone Number: 02/28/2019, 4:36 PM  Clinical Narrative:    Called the patient's daughter Clint Lipps, The patient does not qualify for Medicaid, I explained that she may be in copay days or may be out of days since she lives in ALF. I will check on bed days to see what she has available  She asked me to check on skilled nursing and Memory care, the patient will most likely will not be returning to [facility] She requested that I check on pricing for the memory care that she would like to check on another facility for memory care   Expected Discharge Plan: Skilled Nursing Facility Barriers to Discharge: Continued Medical Work up  Expected Discharge Plan and Services Expected Discharge Plan: Skilled Nursing Facility In-house Referral: Clinical Social Work Discharge Planning Services: NA   Living arrangements for the past 2 months: Assisted Living Facility([facility])                                       Social Determinants of Health (SDOH) Interventions    Readmission Risk Interventions No flowsheet data found.

## 2019-02-28 NOTE — TOC Progression Note (Signed)
Transition of Care [facility]) - Progression Note    Patient Details  Name: Sabrina Burke MRN: [id number] Date of Birth: Nov 03, 1927  Transition of Care [facility]) CM/SW Contact  Barrie Dunker, RN Phone Number: 02/28/2019, 4:13 PM  Clinical Narrative:    Melody called back and stated that they would not be taking the patient back, The patient was able to feed herself and stand and pivot and get in and out of Wheelchair prior to admission, The patient is now on a Dysphagia  1 diet with nectar thick liquid, Will speak with the daughter about possible long term placement   Expected Discharge Plan: Skilled Nursing Facility Barriers to Discharge: Continued Medical Work up  Expected Discharge Plan and Services Expected Discharge Plan: Skilled Nursing Facility In-house Referral: Clinical Social Work Discharge Planning Services: NA   Living arrangements for the past 2 months: Assisted Living Facility([facility])                                       Social Determinants of Health (SDOH) Interventions    Readmission Risk Interventions No flowsheet data found.

## 2019-02-28 NOTE — Progress Notes (Signed)
Physical Therapy Treatment Patient Details Name: Sabrina Burke MRN: [id number] DOB: May 30, 1927 Today's Date: 02/28/2019    History of Present Illness Pt admitted for L hip fracture s/p falling out of bed at ALF. Pt is now s/p L hip hemiarthroplasty, post approach on 02/21/19. Has history of dementia and HTN.    PT Comments    To EOB with max a x 2.  She is able to remain sitting x 4 minutes with mod a x 1 to maintain balance and mod a to complete seated ex's for LE's as she is unable to follow cues.  Sitting tolerance was improved today but remains limited in activity and tolerance.  Standing and transfers deferred at this time for pt and staff safety as she requires significant assist for sitting and is unable to follow directions. She is appropriate for lift transfers at this time if OOB is needed.   Follow Up Recommendations  SNF     Equipment Recommendations  Hospital bed    Recommendations for Other Services       Precautions / Restrictions Precautions Precautions: Fall;Posterior Hip Precaution Booklet Issued: No Required Braces or Orthoses: Other Brace Other Brace: abd pillow Restrictions Weight Bearing Restrictions: Yes LLE Weight Bearing: Weight bearing as tolerated    Mobility  Bed Mobility Overal bed mobility: Needs Assistance Bed Mobility: Supine to Sit;Sit to Supine     Supine to sit: Total assist;+2 for physical assistance Sit to supine: Total assist;+2 for physical assistance      Transfers                 General transfer comment: deferred - pt appropriate for lift transfers in OOB needed  Ambulation/Gait                 Stairs             Wheelchair Mobility    Modified Rankin (Stroke Patients Only)       Balance Overall balance assessment: History of Falls Sitting-balance support: Bilateral upper extremity supported;Feet supported Sitting balance-Leahy Scale: Zero Sitting balance - Comments: requires mod a x 1 to  remains sitting                                    Cognition Arousal/Alertness: Awake/alert Behavior During Therapy: WFL for tasks assessed/performed Overall Cognitive Status: History of cognitive impairments - at baseline                                        Exercises Other Exercises Other Exercises: seated LAQ and marches x 15    General Comments        Pertinent Vitals/Pain Pain Assessment: Faces Faces Pain Scale: Hurts little more Pain Location: knee/hip LLE with movement Pain Descriptors / Indicators: Grimacing;Sore Pain Intervention(s): Limited activity within patient's tolerance;Monitored during session;Repositioned    Home Living                      Prior Function            PT Goals (current goals can now be found in the care plan section) Progress towards PT goals: Progressing toward goals    Frequency    7X/week      PT Plan Current plan remains appropriate    Co-evaluation  AM-PAC PT "6 Clicks" Mobility   Outcome Measure  Help needed turning from your back to your side while in a flat bed without using bedrails?: Total Help needed moving from lying on your back to sitting on the side of a flat bed without using bedrails?: Total Help needed moving to and from a bed to a chair (including a wheelchair)?: Total Help needed standing up from a chair using your arms (e.g., wheelchair or bedside chair)?: Total Help needed to walk in hospital room?: Total Help needed climbing 3-5 steps with a railing? : Total 6 Click Score: 6    End of Session   Activity Tolerance: Patient limited by pain;Treatment limited secondary to agitation Patient left: in bed;with call bell/phone within reach;with bed alarm set;with SCD's reapplied Nurse Communication: Mobility status       Time: 2637-8588 PT Time Calculation (min) (ACUTE ONLY): 11 min  Charges:  $Therapeutic Exercise: 8-22 mins                     Chesley Noon, PTA 02/28/19, 12:58 PM

## 2019-02-28 NOTE — Progress Notes (Signed)
Subjective:  POD #7 s/p left hip hemiarthroplasty.   Patient confused today and may be having visual hallucinations.  She asked me about "barrels" around the room.  patient reports left hip pain as mild.    Objective:   VITALS:   Vitals:   02/27/19 2119 02/27/19 2321 02/28/19 0251 02/28/19 0804  BP: (!) 135/54   (!) 158/62  Pulse: 79   86  Resp:    16  Temp:  100 F (37.8 C) 99.8 F (37.7 C) 98.4 F (36.9 C)  TempSrc:  Rectal Rectal Oral  SpO2:    99%  Weight:      Height:        PHYSICAL EXAM: Left lower extremity Neurovascular intact Sensation intact distally Intact pulses distally Dorsiflexion/Plantar flexion intact Incision: scant drainage No cellulitis present Compartment soft  LABS  Results for orders placed or performed during the hospital encounter of 02/20/19 (from the past 24 hour(s))  Basic metabolic panel     Status: Abnormal   Collection Time: 02/28/19  1:28 AM  Result Value Ref Range   Sodium 149 (H) 135 - 145 mmol/L   Potassium 3.9 3.5 - 5.1 mmol/L   Chloride 116 (H) 98 - 111 mmol/L   CO2 26 22 - 32 mmol/L   Glucose, Bld 110 (H) 70 - 99 mg/dL   BUN 36 (H) 8 - 23 mg/dL   Creatinine, Ser 2.08 (H) 0.44 - 1.00 mg/dL   Calcium 8.6 (L) 8.9 - 10.3 mg/dL   GFR calc non Af Amer 21 (L) >60 mL/min   GFR calc Af Amer 24 (L) >60 mL/min   Anion gap 7 5 - 15  CBC     Status: Abnormal   Collection Time: 02/28/19  9:11 AM  Result Value Ref Range   WBC 7.8 4.0 - 10.5 K/uL   RBC 2.71 (L) 3.87 - 5.11 MIL/uL   Hemoglobin 8.7 (L) 12.0 - 15.0 g/dL   HCT 28.7 (L) 36.0 - 46.0 %   MCV 105.9 (H) 80.0 - 100.0 fL   MCH 32.1 26.0 - 34.0 pg   MCHC 30.3 30.0 - 36.0 g/dL   RDW 12.1 11.5 - 15.5 %   Platelets 367 150 - 400 K/uL   nRBC 0.0 0.0 - 0.2 %  Urinalysis, Complete w Microscopic     Status: Abnormal   Collection Time: 02/28/19 10:34 AM  Result Value Ref Range   Color, Urine YELLOW (A) YELLOW   APPearance HAZY (A) CLEAR   Specific Gravity, Urine 1.012 1.005 -  1.030   pH 5.0 5.0 - 8.0   Glucose, UA NEGATIVE NEGATIVE mg/dL   Hgb urine dipstick NEGATIVE NEGATIVE   Bilirubin Urine NEGATIVE NEGATIVE   Ketones, ur NEGATIVE NEGATIVE mg/dL   Protein, ur 30 (A) NEGATIVE mg/dL   Nitrite NEGATIVE NEGATIVE   Leukocytes,Ua SMALL (A) NEGATIVE   RBC / HPF 11-20 0 - 5 RBC/hpf   WBC, UA 11-20 0 - 5 WBC/hpf   Bacteria, UA RARE (A) NONE SEEN   Squamous Epithelial / LPF 0-5 0 - 5   Mucus PRESENT    Budding Yeast PRESENT    Hyaline Casts, UA PRESENT     DG Chest Port 1 View  Result Date: 02/28/2019 CLINICAL DATA:  Fever. EXAM: PORTABLE CHEST 1 VIEW COMPARISON:  Chest x-ray dated February 22, 2019. FINDINGS: The heart size and mediastinal contours are within normal limits. Atherosclerotic calcification of the aortic arch. Normal pulmonary vascularity. No focal consolidation, pleural effusion, or  pneumothorax. Unchanged elevation of the right hemidiaphragm. No acute osseous abnormality. IMPRESSION: No active disease. Electronically Signed   By: Obie Dredge M.D.   On: 02/28/2019 09:16    Assessment/Plan: 7 Days Post-Op   Active Problems:   Closed left hip fracture (HCC)   Fever   Protein-calorie malnutrition, severe   Essential hypertension   Acquired hypothyroidism  Continue physical therapy.  Continue Lovenox.  Patient okay for discharge from an orthopedic standpoint once cleared medically.    Juanell Fairly , MD 02/28/2019, 2:21 PM

## 2019-02-28 NOTE — Progress Notes (Addendum)
Occupational Therapy Treatment Patient Details Name: Sabrina Burke MRN: [id number] DOB: 1927-02-13 Today's Date: 02/28/2019    History of present illness Pt admitted for L hip fracture s/p falling out of bed at ALF. Pt is now s/p L hip hemiarthroplasty, post approach on 02/21/19. Has history of dementia and HTN.   OT comments  Pt seen for OT tx this date to f/u re: safety and independence with self care ADLs. Pt declines to sit EOB with OT this session and per PTA she has already sat up earlier this date. OT instead attempts to engage pt in UB ADLs.  Pt requires MOD/MAX A with hand over hand assist to initiate and MAX verbal/tactile cues to attend/sequence for UB bathing and grooming. In addition, pt requires MIN/MOD A to complete hand to mouth with spoon for ice cream with MOD verbal/tactile cues to attend and intermittent hand over hand with 15% spillage. To make self-feeding more ergonomic for pt, OT places pillow under her right elbow to decrease ROM arc required to compete hand to mouth. CNA presents to complete assisting pt with lunch tray and pt is in bed with bed alarm on. Return to her ALF with 24/7 supv remains most appropriate.    Follow Up Recommendations  Supervision/Assistance - 24 hour;Home health OT;Other (comment)(return to prior ALF-[facility])    Equipment Recommendations  Other (comment)(defer)    Recommendations for Other Services      Precautions / Restrictions Precautions Precautions: Fall;Posterior Hip Precaution Booklet Issued: No Required Braces or Orthoses: Other Brace Other Brace: abd pillow Restrictions Weight Bearing Restrictions: Yes LLE Weight Bearing: Weight bearing as tolerated       Mobility Bed Mobility Overal bed mobility: Needs Assistance Bed Mobility: Supine to Sit;Sit to Supine     Supine to sit: Total assist;+2 for physical assistance Sit to supine: Total assist;+2 for physical assistance   General bed mobility comments: pt declines  to sit EOB with OT this session, per PTA, she sat up for 4 minutes earlier today.  Transfers                 General transfer comment: deferred - pt appropriate for lift transfers in OOB needed    Balance                                   ADL either performed or assessed with clinical judgement   ADL Overall ADL's : Needs assistance/impaired Eating/Feeding: Minimal assistance;Moderate assistance;Bed level;Cueing for sequencing Eating/Feeding Details (indicate cue type and reason): with HOB elevated to perform hand to mouth with spoon with 15% spillage. with cues to attend. Grooming: Moderate assistance;Maximal assistance;Bed level Grooming Details (indicate cue type and reason): with HOB elevated with HOH to initiate task. Upper Body Bathing: Maximal assistance;Bed level Upper Body Bathing Details (indicate cue type and reason): to engage in cleaning underarms/chest area at bed level with HOB elevated with MAX vebal/tactile cues to attend and hand over hand to initiate task.                                 Vision   Additional Comments: unable to formally assess, moderate visual attention/tracking.   Perception     Praxis      Cognition Arousal/Alertness: Awake/alert Behavior During Therapy: WFL for tasks assessed/performed Overall Cognitive Status: History of cognitive impairments - at baseline  General Comments: Pt attends to commands ~60-75% of the time with verbal and tactile cues. Increased time for processing.        Exercises   Shoulder Instructions       General Comments      Pertinent Vitals/ Pain       Pain Assessment: Faces Faces Pain Scale: Hurts little more Pain Location: knee/hip LLE with movement Pain Descriptors / Indicators: Grimacing;Sore Pain Intervention(s): Limited activity within patient's tolerance;Monitored during session;Repositioned  Home Living                                           Prior Functioning/Environment              Frequency  Min 1X/week        Progress Toward Goals  OT Goals(current goals can now be found in the care plan section)  Progress towards OT goals: OT to reassess next treatment  Acute Rehab OT Goals OT Goal Formulation: Patient unable to participate in goal setting Time For Goal Achievement: 03/09/19  Plan Discharge plan remains appropriate    Co-evaluation                 AM-PAC OT "6 Clicks" Daily Activity     Outcome Measure   Help from another person eating meals?: A Lot Help from another person taking care of personal grooming?: A Lot Help from another person toileting, which includes using toliet, bedpan, or urinal?: Total Help from another person bathing (including washing, rinsing, drying)?: Total Help from another person to put on and taking off regular upper body clothing?: A Lot Help from another person to put on and taking off regular lower body clothing?: A Lot 6 Click Score: 10    End of Session    OT Visit Diagnosis: Other abnormalities of gait and mobility (R26.89);History of falling (Z91.81)   Activity Tolerance Patient tolerated treatment well   Patient Left in bed;with call bell/phone within reach;with bed alarm set;with SCD's reapplied;Other (comment)   Nurse Communication Other (comment)(notified CNA that pt has lunch tray, OTR started pt on lunch, but pt will require assist to finish)        Time: 3419-3790 OT Time Calculation (min): 24 min  Charges: OT General Charges $OT Visit: 1 Visit OT Treatments $Self Care/Home Management : 23-37 mins  Gerrianne Scale, MS, OTR/L ascom [phone] 02/28/19, 1:20 PM

## 2019-02-28 NOTE — Progress Notes (Signed)
PROGRESS NOTE    Sabrina Burke  BLT:[id number] DOB: 30-Sep-1927 DOA: 02/20/2019 PCP: Devoria Glassing, NP    Brief Narrative:  [name]  is a 86 y.o. pleasantly demented Caucasian female with a known history of dementia and hypertension, who presented to the emergency room with acute onset of left hip pain after having a mechanical fall at [facility].    Consultants:   Ortho  Procedures:  S/P left hip hemiarthroplasty on 02/21/19  Antimicrobials:   Zosyn x5 days completed 02/25/2019   Subjective: Febrile last night. Tmax 100. Pt denies sob, abd pain, cp, or chills.   Objective: Vitals:   02/27/19 2119 02/27/19 2321 02/28/19 0251 02/28/19 0804  BP: (!) 135/54   (!) 158/62  Pulse: 79   86  Resp:    16  Temp:  100 F (37.8 C) 99.8 F (37.7 C) 98.4 F (36.9 C)  TempSrc:  Rectal Rectal Oral  SpO2:    99%  Weight:      Height:        Intake/Output Summary (Last 24 hours) at 02/28/2019 1301 Last data filed at 02/28/2019 1019 Gross per 24 hour  Intake 360 ml  Output 1350 ml  Net -990 ml   Filed Weights   02/20/19 1951  Weight: 45.4 kg    Examination:  General exam: Appears calm, baseline confused, pleasant ,  sitting up in bed  Respiratory system: Clear to auscultation with poor respiratory effort  No w/r/r Cardiovascular system: S1 & S2 heard, RRR. No  murmurs, rubs, gallops or clicks.  Gastrointestinal system: Abdomen is nondistended, soft and nontender.  Normal bowel sounds heard.   Central nervous system: awake,confused (baseline) Extremities:no edema, wedge between legs Skin: warm, dry .     Data Reviewed: I have personally reviewed following labs and imaging studies  CBC: Recent Labs  Lab 02/22/19 0419 02/23/19 0553 02/24/19 0459 02/27/19 0716 02/28/19 0911  WBC 8.9 9.7 7.8  --  7.8  HGB 8.6* 8.1* 8.1* 8.3* 8.7*  HCT 26.9* 26.4* 26.3* 27.7* 28.7*  MCV 103.1* 106.0* 105.2*  --  105.9*  PLT 205 218 243  --  367    Basic Metabolic Panel: Recent Labs  Lab 02/24/19 0459 02/25/19 0444 02/26/19 0604 02/27/19 0716 02/28/19 0128  NA 147* 150* 148* 151* 149*  K 3.7 3.6 3.3* 3.9 3.9  CL 113* 116* 115* 118* 116*  CO2 26 26 23 25 26   GLUCOSE 169* 122* 126* 111* 110*  BUN 39* 36* 34* 34* 36*  CREATININE 2.20* 2.20* 2.08* 2.17* 2.08*  CALCIUM 8.4* 8.5* 8.3* 8.4* 8.6*   GFR: Estimated Creatinine Clearance: 13.1 mL/min (A) (by C-G formula based on SCr of 2.08 mg/dL (H)). Liver Function Tests: No results for input(s): AST, ALT, ALKPHOS, BILITOT, PROT, ALBUMIN in the last 168 hours. No results for input(s): LIPASE, AMYLASE in the last 168 hours. No results for input(s): AMMONIA in the last 168 hours. Coagulation Profile: No results for input(s): INR, PROTIME in the last 168 hours. Cardiac Enzymes: No results for input(s): CKTOTAL, CKMB, CKMBINDEX, TROPONINI in the last 168 hours. BNP (last 3 results) No results for input(s): PROBNP in the last 8760 hours. HbA1C: No results for input(s): HGBA1C in the last 72 hours. CBG: No results for input(s): GLUCAP in the last 168 hours. Lipid Profile: No results for input(s): CHOL, HDL, LDLCALC, TRIG, CHOLHDL, LDLDIRECT in the last 72 hours. Thyroid Function Tests: No results for input(s): TSH, T4TOTAL, FREET4, T3FREE, THYROIDAB in  the last 72 hours. Anemia Panel: No results for input(s): VITAMINB12, FOLATE, FERRITIN, TIBC, IRON, RETICCTPCT in the last 72 hours. Sepsis Labs: No results for input(s): PROCALCITON, LATICACIDVEN in the last 168 hours.  Recent Results (from the past 240 hour(s))  Respiratory Panel by RT PCR (Flu A&B, Covid) - Nasopharyngeal Swab     Status: None   Collection Time: 02/20/19  8:36 PM   Specimen: Nasopharyngeal Swab  Result Value Ref Range Status   SARS Coronavirus 2 by RT PCR NEGATIVE NEGATIVE Final    Comment: (NOTE) SARS-CoV-2 target nucleic acids are NOT DETECTED. The SARS-CoV-2 RNA is generally detectable in upper  respiratoy specimens during the acute phase of infection. The lowest concentration of SARS-CoV-2 viral copies this assay can detect is 131 copies/mL. A negative result does not preclude SARS-Cov-2 infection and should not be used as the sole basis for treatment or other patient management decisions. A negative result may occur with  improper specimen collection/handling, submission of specimen other than nasopharyngeal swab, presence of viral mutation(s) within the areas targeted by this assay, and inadequate number of viral copies (<131 copies/mL). A negative result must be combined with clinical observations, patient history, and epidemiological information. The expected result is Negative. Fact Sheet for Patients:  https://www.moore.com/ Fact Sheet for Healthcare Providers:  https://www.young.biz/ This test is not yet ap proved or cleared by the [place] FDA and  has been authorized for detection and/or diagnosis of SARS-CoV-2 by FDA under an Emergency Use Authorization (EUA). This EUA will remain  in effect (meaning this test can be used) for the duration of the COVID-19 declaration under Section 564(b)(1) of the Act, 21 U.S.C. section 360bbb-3(b)(1), unless the authorization is terminated or revoked sooner.    Influenza A by PCR NEGATIVE NEGATIVE Final   Influenza B by PCR NEGATIVE NEGATIVE Final    Comment: (NOTE) The Xpert Xpress SARS-CoV-2/FLU/RSV assay is intended as an aid in  the diagnosis of influenza from Nasopharyngeal swab specimens and  should not be used as a sole basis for treatment. Nasal washings and  aspirates are unacceptable for Xpert Xpress SARS-CoV-2/FLU/RSV  testing. Fact Sheet for Patients: https://www.moore.com/ Fact Sheet for Healthcare Providers: https://www.young.biz/ This test is not yet approved or cleared by the [place] FDA and  has been authorized for  detection and/or diagnosis of SARS-CoV-2 by  FDA under an Emergency Use Authorization (EUA). This EUA will remain  in effect (meaning this test can be used) for the duration of the  Covid-19 declaration under Section 564(b)(1) of the Act, 21  U.S.C. section 360bbb-3(b)(1), unless the authorization is  terminated or revoked. Performed at [facility], [address] [place], [place] [address]   Blood culture (routine x 2)     Status: None   Collection Time: 02/20/19  8:53 PM   Specimen: BLOOD  Result Value Ref Range Status   Specimen Description BLOOD RIGHT ANTECUBITAL  Final   Special Requests   Final    BOTTLES DRAWN AEROBIC AND ANAEROBIC Blood Culture results may not be optimal due to an inadequate volume of blood received in culture bottles   Culture   Final    NO GROWTH 5 DAYS Performed at [facility], [address], [place], [place] [address]    Report Status 02/25/2019 FINAL  Final  Blood culture (routine x 2)     Status: None   Collection Time: 02/20/19  8:53 PM   Specimen: BLOOD  Result Value Ref Range Status  Specimen Description BLOOD BLOOD LEFT HAND  Final   Special Requests   Final    BOTTLES DRAWN AEROBIC AND ANAEROBIC Blood Culture adequate volume   Culture   Final    NO GROWTH 5 DAYS Performed at [facility], [address] [place], [place] [address]    Report Status 02/25/2019 FINAL  Final  Urine Culture     Status: Abnormal   Collection Time: 02/20/19 11:44 PM   Specimen: Urine, Clean Catch  Result Value Ref Range Status   Specimen Description   Final    URINE, CLEAN CATCH Performed at [facility], [address] [place], [place] [address]    Special Requests   Final    NONE Performed at [facility] [place], [place] [address]    Culture >=100,000 COLONIES/mL ESCHERICHIA COLI (A)  Final   Report Status 02/22/2019 FINAL  Final   Organism ID, Bacteria ESCHERICHIA COLI (A)  Final       Susceptibility   Escherichia coli - MIC*    AMPICILLIN 8 SENSITIVE Sensitive     CEFAZOLIN <=4 SENSITIVE Sensitive     CEFTRIAXONE <=0.25 SENSITIVE Sensitive     CIPROFLOXACIN <=0.25 SENSITIVE Sensitive     GENTAMICIN <=1 SENSITIVE Sensitive     IMIPENEM <=0.25 SENSITIVE Sensitive     NITROFURANTOIN <=16 SENSITIVE Sensitive     TRIMETH/SULFA <=20 SENSITIVE Sensitive     AMPICILLIN/SULBACTAM <=2 SENSITIVE Sensitive     PIP/TAZO <=4 SENSITIVE Sensitive     * >=100,000 COLONIES/mL ESCHERICHIA COLI  Surgical pcr screen     Status: None   Collection Time: 02/21/19 12:50 AM   Specimen: Nasal Mucosa; Nasal Swab  Result Value Ref Range Status   MRSA, PCR NEGATIVE NEGATIVE Final   Staphylococcus aureus NEGATIVE NEGATIVE Final    Comment: (NOTE) The Xpert SA Assay (FDA approved for NASAL specimens in patients 70 years of age and older), is one component of a comprehensive surveillance program. It is not intended to diagnose infection nor to guide or monitor treatment. Performed at [facility], [address] [place], [place] [address]   CULTURE, BLOOD (ROUTINE X 2) w Reflex to ID Panel     Status: None   Collection Time: 02/22/19  1:02 PM   Specimen: BLOOD  Result Value Ref Range Status   Specimen Description BLOOD BLOOD RIGHT HAND  Final   Special Requests   Final    BOTTLES DRAWN AEROBIC AND ANAEROBIC Blood Culture results may not be optimal due to an inadequate volume of blood received in culture bottles   Culture   Final    NO GROWTH 5 DAYS Performed at [facility], [address] [place], [place] [address]    Report Status 02/27/2019 FINAL  Final  CULTURE, BLOOD (ROUTINE X 2) w Reflex to ID Panel     Status: None   Collection Time: 02/22/19  1:08 PM   Specimen: BLOOD  Result Value Ref Range Status   Specimen Description BLOOD RIGHT ANTECUBITAL  Final   Special Requests   Final    BOTTLES DRAWN AEROBIC AND ANAEROBIC Blood Culture adequate volume    Culture   Final    NO GROWTH 5 DAYS Performed at [facility], [address], [place], [place] [address]    Report Status 02/27/2019 FINAL  Final         Radiology Studies: [facility] Chest Port 1 View  Result Date: 02/28/2019 CLINICAL DATA:  Fever. EXAM: PORTABLE CHEST 1 VIEW COMPARISON:  Chest x-ray dated February 22, 2019. FINDINGS: The heart size and mediastinal contours are within normal limits. Atherosclerotic calcification of the aortic arch. Normal pulmonary vascularity. No focal consolidation, pleural effusion, or pneumothorax. Unchanged elevation of the right hemidiaphragm. No acute osseous abnormality. IMPRESSION: No active disease. Electronically Signed   By: Obie Dredge M.D.   On: 02/28/2019 09:16        Scheduled Meds: . Chlorhexidine Gluconate Cloth  6 each Topical Daily  . cholecalciferol  2,000 Units Oral Daily  . enoxaparin (LOVENOX) injection  40 mg Subcutaneous Q24H  . famotidine  20 mg Oral QHS  . ferrous sulfate  325 mg Oral TID PC  . levothyroxine  88 mcg Oral Q0600  . LORazepam  0.25 mg Oral BID  . metoprolol tartrate  12.5 mg Oral BID  . multivitamin with minerals   Oral Daily  . potassium chloride  10 mEq Oral BID  . senna  1 tablet Oral BID  . traMADol  50 mg Oral Q6H   Continuous Infusions: . sodium chloride 50 mL/hr at 02/26/19 1508    Assessment & Plan:   Active Problems:   Closed left hip fracture (HCC)   Fever   Protein-calorie malnutrition, severe   Essential hypertension   Acquired hypothyroidism   1. Left hip fracture- secondary to mechanical fall: S/P  left hip hemiarthroplasty; POD 4 - Pain management   - Cont IV hydration  - PT/OT per Ortho -rec. 24 hr supervision,home health. -Palliative care consulted, will f/u -speech swallow consult -weightbearing as tolerated on the left lower extremity with posterior hip precautions.  -DC  from an orthopedic standpoint  -ortho recommend patient be discharged on Lovenox 40 mg  daily next 14 days for DVT prophylaxis  2. UTI with subsequent sepsis:  With leukocytosis and fever On IV Zosyn since there was thought of UTI and aspiration pneumonia...>completed 5 day course -Urine culture grew E. Coli -Chest x-ray from 2/2 did not reveal aspiration Blood cultures TD negative  3.Fever-new 02/26/18 Possibly due to postop inflammation We will check blood cultures, urine culture, urinalysis. X-ray completed--> negative for acute issues Encouraged incentive spirometer WBC normal We will hold off on antibiotic initiation at this time unless blood cultures come back positive   3.Hypernatremia-Etiology unclear. Possibly 2/2 solutes from ivf, was switch to 1/2ns improved and then back up again...>ivf d/c;d 2/7 now na coming down. Continue to monitor.   4. Hypertensive urgency: Resolved now  - switched iv beta blk to po - continue amlodipine. Improving, when takes meds  5. Acute kidney injury superimposed on stage IIIb chronic kidney disease.-- Improved following hydration  - Cont IV normal  - creatinine stabilized, this likely the baseline - Avoid potential nephrotoxins  6. Hypothyroidism. -- TSH WNL  - Continue home Synthroid, switch from iv to po   7.Nutrition- has poor intake Encouraged po intact with assistance  8. Hypokalemia- Replace and resolved Monitor levels -    DVT prophylaxis: Heparin  code Status:DNR Family Communication: none at bedside Disposition Plan: needs covid testing prior to going back to her residence home and however we are unsure of when they will accept her.  Per social work [facility] had a Covid outbreak and their RN will need to come and evaluate prior to return.  Sodium level is elevated , and now febrile. Needs to be afebrile 24hrs prior to discharge.     LOS: 8 days   Time spent:  45 min with more than 50% on coc    Lynn Ito, MD [facility] Pager [phone]  If 7PM-7AM, please contact  night-coverage [email or web address] Password [email or web address] 02/28/2019, 1:01 PM Patient ID: Sabrina Burke, female   DOB: November 26, 1927, 86 y.o.   MRN: [id number]

## 2019-02-28 NOTE — TOC Progression Note (Signed)
Transition of Care [facility]) - Progression Note    Patient Details  Name: Sabrina Burke MRN: [id number] Date of Birth: Jul 24, 1927  [facility] CM/SW Contact  Barrie Dunker, RN Phone Number: 02/28/2019, 3:41 PM  Clinical Narrative:    Lowanda Foster from [facility] came and evaluated the patient she stated that the patient is non ambulatory and she does not know if they can take her back, I explained that the patient was non ambulatory prior to the hospital stay and she is not rehabable.  I explained that insurance will not cover rehab when the patient is currently at baseline.  I contacted the patient's daughter Clint Lipps at [phone] and explained what [place] has said,  The daughter is going to call Melody the Librarian, academic at [employer] and speak with her about it as the patient is at the baseline, She will let me know the outcome   Expected Discharge Plan: Skilled Nursing Facility Barriers to Discharge: Continued Medical Work up  Expected Discharge Plan and Services Expected Discharge Plan: Skilled Nursing Facility In-house Referral: Clinical Social Work Discharge Planning Services: NA   Living arrangements for the past 2 months: Assisted Living Facility([facility])                                       Social Determinants of Health (SDOH) Interventions    Readmission Risk Interventions No flowsheet data found.

## 2019-03-01 LAB — URINE CULTURE: Culture: 40000 — AB

## 2019-03-01 LAB — SODIUM: Sodium: 153 mmol/L — ABNORMAL HIGH (ref 135–145)

## 2019-03-01 MED ORDER — DEXTROSE 5 % IV SOLN: INTRAVENOUS | Status: DC

## 2019-03-01 MED ORDER — METOPROLOL TARTRATE 25 MG PO TABS
25.0000 mg | ORAL_TABLET | Freq: Two times a day (BID) | ORAL | Status: DC
  Administered 2019-03-01 – 2019-03-03 (×3): 25 mg via ORAL
  Filled 2019-03-01 (×4): qty 1

## 2019-03-01 MED ORDER — AMLODIPINE BESYLATE 5 MG PO TABS
5.0000 mg | ORAL_TABLET | Freq: Every day | ORAL | Status: DC
  Administered 2019-03-01 – 2019-03-03 (×3): 5 mg via ORAL
  Filled 2019-03-01 (×3): qty 1

## 2019-03-01 MED ORDER — FLUCONAZOLE 50 MG PO TABS
150.0000 mg | ORAL_TABLET | Freq: Once | ORAL | Status: AC
  Administered 2019-03-01: 150 mg via ORAL
  Filled 2019-03-01: qty 1

## 2019-03-01 NOTE — Progress Notes (Signed)
Physical Therapy Treatment Patient Details Name: Sabrina Burke MRN: [id number] DOB: 1927-11-02 Today's Date: 03/01/2019    History of Present Illness Pt admitted for L hip fracture s/p falling out of bed at ALF. Pt is now s/p L hip hemiarthroplasty, post approach on 02/21/19. Has history of dementia and HTN.    PT Comments    Pt was awake in supine upon arriving. She is very talkative and agrees to PT however cognition continues to limit progression. She was able to sit EOB x 2 minutes with + 2 assist and max encouragement/vcs for participation. Pt has severe posterior push in sitting requiring constant assistance to prevent posterior fall. She was however able to perform and tolerate increased LLE exercises this date with less visible pain. PT will continue to follow per POC and progress pt as able per pt tolerance.     Follow Up Recommendations  SNF     Equipment Recommendations       Recommendations for Other Services       Precautions / Restrictions Precautions Precautions: Fall;Posterior Hip Precaution Booklet Issued: No Required Braces or Orthoses: Other Brace(abduction pillow) Other Brace: abd pillow Restrictions Weight Bearing Restrictions: Yes LLE Weight Bearing: Weight bearing as tolerated    Mobility  Bed Mobility Overal bed mobility: Needs Assistance Bed Mobility: Supine to Sit;Sit to Supine     Supine to sit: Total assist;+2 for physical assistance Sit to supine: Total assist;+2 for physical assistance   General bed mobility comments: Pt required + 2 assist to progress to EOB sitting with increased time + tactile and verbal cues for encouragement throughout. Pt yells out with movements but once seated EOB tolerated x 2 minutes prior to returning to supine + total. Pt continues to have severe posterior push while seated EOB.  Transfers                 General transfer comment: unsafe to progress at this time. recommend lift if pt needs to get  OOB.  Ambulation/Gait                 Stairs             Wheelchair Mobility    Modified Rankin (Stroke Patients Only)       Balance Overall balance assessment: History of Falls Sitting-balance support: Bilateral upper extremity supported;Feet supported Sitting balance-Leahy Scale: Zero Sitting balance - Comments: pt with severe posterior push requiring constant assist to maintain EOB sitting. Vcs throughout for forward wt shift however pt unable to follow commands and is unable to wt shift fwd.                                    Cognition Arousal/Alertness: Awake/alert Behavior During Therapy: WFL for tasks assessed/performed Overall Cognitive Status: History of cognitive impairments - at baseline                                 General Comments: Pt very alert throughout but continues to present with confusion and has difficulty being able to follow commands. Pt inconsistantly performs desired task requested.       Exercises General Exercises - Lower Extremity Ankle Circles/Pumps: AROM;Both;5 reps;Supine Quad Sets: AROM;Both;5 reps;Supine Heel Slides: AAROM;Left;10 reps;Supine Hip ABduction/ADduction: PROM;10 reps;Supine Straight Leg Raises: AAROM;Left;5 reps Other Exercises Other Exercises: Increased time required with max encouragement and tactile  cues for participation.     General Comments        Pertinent Vitals/Pain Pain Assessment: No/denies pain Pain Score: 0-No pain Faces Pain Scale: Hurts even more Pain Location: knee/hip LLE with movement Pain Descriptors / Indicators: Sharp;Guarding;Discomfort Pain Intervention(s): Limited activity within patient's tolerance;Repositioned    Home Living                      Prior Function            PT Goals (current goals can now be found in the care plan section) Acute Rehab PT Goals Patient Stated Goal: Pt unable to state Progress towards PT goals: Not  progressing toward goals - comment(baseline cognition deficits limiting progress)    Frequency    7X/week      PT Plan Current plan remains appropriate    Co-evaluation              AM-PAC PT "6 Clicks" Mobility   Outcome Measure  Help needed turning from your back to your side while in a flat bed without using bedrails?: Total Help needed moving from lying on your back to sitting on the side of a flat bed without using bedrails?: Total Help needed moving to and from a bed to a chair (including a wheelchair)?: Total Help needed standing up from a chair using your arms (e.g., wheelchair or bedside chair)?: Total Help needed to walk in hospital room?: Total Help needed climbing 3-5 steps with a railing? : Total 6 Click Score: 6    End of Session Equipment Utilized During Treatment: Other (comment)(hip abduction pillow replaced post session) Activity Tolerance: No increased pain;Treatment limited secondary to agitation(pt becomes aggitated when sitting EOB) Patient left: in bed;with call bell/phone within reach;with bed alarm set;with SCD's reapplied;Other (comment)(+ abduction pillow replaced) Nurse Communication: Mobility status PT Visit Diagnosis: Muscle weakness (generalized) (M62.81);History of falling (Z91.81)     Time: 1355-1415 PT Time Calculation (min) (ACUTE ONLY): 20 min  Charges:  $Therapeutic Exercise: 8-22 mins                     Julaine Fusi PTA 03/01/19, 3:18 PM

## 2019-03-01 NOTE — TOC Progression Note (Signed)
Transition of Care [facility]) - Progression Note    Patient Details  Name: Sabrina Burke MRN: [id number] Date of Birth: [birth year]  [facility]) CM/SW Contact  Barrie Dunker, RN Phone Number: 03/01/2019, 12:32 PM  Clinical Narrative:    Spoke with the daughter Sabrina Burke and reviewed the 2 bed offers, she said [place] is too far, she will call [facility] to ask about pricing I provided the number to her   Expected Discharge Plan: Skilled Nursing Facility Barriers to Discharge: Continued Medical Work up  Expected Discharge Plan and Services Expected Discharge Plan: Skilled Nursing Facility In-house Referral: Clinical Social Work Discharge Planning Services: NA   Living arrangements for the past 2 months: Assisted Living Facility([facility])                                       Social Determinants of Health (SDOH) Interventions    Readmission Risk Interventions No flowsheet data found.

## 2019-03-01 NOTE — Progress Notes (Signed)
PROGRESS NOTE    Sabrina Burke  XBM:[id number] DOB: 1927/11/03 DOA: 02/20/2019 PCP: Devoria Glassing, NP    Brief Narrative:  Sabrina Burke  is a 86 y.o. pleasantly demented Caucasian female with a known history of dementia and hypertension, who presented to the emergency room with acute onset of left hip pain after having a mechanical fall at [facility].    Consultants:   Ortho  Procedures:  S/P left hip hemiarthroplasty on 02/21/19  Antimicrobials:   Zosyn x5 days completed 02/25/2019   Subjective: Febrile last night. Tmax 100. Pt denies sob, abd pain, cp, or chills. BP elevated prior to giving meds this am.   Objective: Vitals:   03/01/19 0036 03/01/19 0748 03/01/19 0908 03/01/19 1005  BP: (!) 155/69 (!) 205/88 (!) 171/56 (!) 162/53  Pulse: 65 70 63 83  Resp: 19 16  17   Temp: 97.9 F (36.6 C) (!) 97.4 F (36.3 C)  (!) 97.5 F (36.4 C)  TempSrc: Oral Oral  Oral  SpO2: 100% 99%  98%  Weight:      Height:        Intake/Output Summary (Last 24 hours) at 03/01/2019 1134 Last data filed at 03/01/2019 0900 Gross per 24 hour  Intake 480 ml  Output 700 ml  Net -220 ml   Filed Weights   02/20/19 1951  Weight: 45.4 kg    Examination:  General exam: Appears calm,  NAD Respiratory system: Clear to auscultation with poor respiratory effort  No w/r/r Cardiovascular system: S1 & S2 heard, RRR. No  murmurs, rubs, gallops or clicks.  Gastrointestinal system: Abdomen is nondistended, soft and nontender.  Normal bowel sounds heard.   Central nervous system: awake, baseline confusion Extremities:no edema, wedge between legs Skin: warm, dry .     Data Reviewed: I have personally reviewed following labs and imaging studies  CBC: Recent Labs  Lab 02/23/19 0553 02/24/19 0459 02/27/19 0716 02/28/19 0911  WBC 9.7 7.8  --  7.8  HGB 8.1* 8.1* 8.3* 8.7*  HCT 26.4* 26.3* 27.7* 28.7*  MCV 106.0* 105.2*  --  105.9*  PLT 218 243  --  367    Basic Metabolic Panel: Recent Labs  Lab 02/24/19 0459 02/24/19 0459 02/25/19 0444 02/26/19 0604 02/27/19 0716 02/28/19 0128 03/01/19 0407  NA 147*   < > 150* 148* 151* 149* 153*  K 3.7  --  3.6 3.3* 3.9 3.9  --   CL 113*  --  116* 115* 118* 116*  --   CO2 26  --  26 23 25 26   --   GLUCOSE 169*  --  122* 126* 111* 110*  --   BUN 39*  --  36* 34* 34* 36*  --   CREATININE 2.20*  --  2.20* 2.08* 2.17* 2.08*  --   CALCIUM 8.4*  --  8.5* 8.3* 8.4* 8.6*  --    < > = values in this interval not displayed.   GFR: Estimated Creatinine Clearance: 13.1 mL/min (A) (by C-G formula based on SCr of 2.08 mg/dL (H)). Liver Function Tests: No results for input(s): AST, ALT, ALKPHOS, BILITOT, PROT, ALBUMIN in the last 168 hours. No results for input(s): LIPASE, AMYLASE in the last 168 hours. No results for input(s): AMMONIA in the last 168 hours. Coagulation Profile: No results for input(s): INR, PROTIME in the last 168 hours. Cardiac Enzymes: No results for input(s): CKTOTAL, CKMB, CKMBINDEX, TROPONINI in the last 168 hours. BNP (last 3 results) No results  for input(s): PROBNP in the last 8760 hours. HbA1C: No results for input(s): HGBA1C in the last 72 hours. CBG: No results for input(s): GLUCAP in the last 168 hours. Lipid Profile: No results for input(s): CHOL, HDL, LDLCALC, TRIG, CHOLHDL, LDLDIRECT in the last 72 hours. Thyroid Function Tests: No results for input(s): TSH, T4TOTAL, FREET4, T3FREE, THYROIDAB in the last 72 hours. Anemia Panel: No results for input(s): VITAMINB12, FOLATE, FERRITIN, TIBC, IRON, RETICCTPCT in the last 72 hours. Sepsis Labs: No results for input(s): PROCALCITON, LATICACIDVEN in the last 168 hours.  Recent Results (from the past 240 hour(s))  Respiratory Panel by RT PCR (Flu A&B, Covid) - Nasopharyngeal Swab     Status: None   Collection Time: 02/20/19  8:36 PM   Specimen: Nasopharyngeal Swab  Result Value Ref Range Status   SARS Coronavirus 2 by RT  PCR NEGATIVE NEGATIVE Final    Comment: (NOTE) SARS-CoV-2 target nucleic acids are NOT DETECTED. The SARS-CoV-2 RNA is generally detectable in upper respiratoy specimens during the acute phase of infection. The lowest concentration of SARS-CoV-2 viral copies this assay can detect is 131 copies/mL. A negative result does not preclude SARS-Cov-2 infection and should not be used as the sole basis for treatment or other patient management decisions. A negative result may occur with  improper specimen collection/handling, submission of specimen other than nasopharyngeal swab, presence of viral mutation(s) within the areas targeted by this assay, and inadequate number of viral copies (<131 copies/mL). A negative result must be combined with clinical observations, patient history, and epidemiological information. The expected result is Negative. Fact Sheet for Patients:  https://www.moore.com/ Fact Sheet for Healthcare Providers:  https://www.young.biz/ This test is not yet ap proved or cleared by the [place] FDA and  has been authorized for detection and/or diagnosis of SARS-CoV-2 by FDA under an Emergency Use Authorization (EUA). This EUA will remain  in effect (meaning this test can be used) for the duration of the COVID-19 declaration under Section 564(b)(1) of the Act, 21 U.S.C. section 360bbb-3(b)(1), unless the authorization is terminated or revoked sooner.    Influenza A by PCR NEGATIVE NEGATIVE Final   Influenza B by PCR NEGATIVE NEGATIVE Final    Comment: (NOTE) The Xpert Xpress SARS-CoV-2/FLU/RSV assay is intended as an aid in  the diagnosis of influenza from Nasopharyngeal swab specimens and  should not be used as a sole basis for treatment. Nasal washings and  aspirates are unacceptable for Xpert Xpress SARS-CoV-2/FLU/RSV  testing. Fact Sheet for Patients: https://www.moore.com/ Fact Sheet for Healthcare  Providers: https://www.young.biz/ This test is not yet approved or cleared by the [place] FDA and  has been authorized for detection and/or diagnosis of SARS-CoV-2 by  FDA under an Emergency Use Authorization (EUA). This EUA will remain  in effect (meaning this test can be used) for the duration of the  Covid-19 declaration under Section 564(b)(1) of the Act, 21  U.S.C. section 360bbb-3(b)(1), unless the authorization is  terminated or revoked. Performed at [facility], [address] [place], [place] [address]   Blood culture (routine x 2)     Status: None   Collection Time: 02/20/19  8:53 PM   Specimen: BLOOD  Result Value Ref Range Status   Specimen Description BLOOD RIGHT ANTECUBITAL  Final   Special Requests   Final    BOTTLES DRAWN AEROBIC AND ANAEROBIC Blood Culture results may not be optimal due to an inadequate volume of blood received in culture bottles   Culture  Final    NO GROWTH 5 DAYS Performed at [facility], [address] [place], [place] [address]    Report Status 02/25/2019 FINAL  Final  Blood culture (routine x 2)     Status: None   Collection Time: 02/20/19  8:53 PM   Specimen: BLOOD  Result Value Ref Range Status   Specimen Description BLOOD BLOOD LEFT HAND  Final   Special Requests   Final    BOTTLES DRAWN AEROBIC AND ANAEROBIC Blood Culture adequate volume   Culture   Final    NO GROWTH 5 DAYS Performed at [facility], [address] [place], [place] [address]    Report Status 02/25/2019 FINAL  Final  Urine Culture     Status: Abnormal   Collection Time: 02/20/19 11:44 PM   Specimen: Urine, Clean Catch  Result Value Ref Range Status   Specimen Description   Final    URINE, CLEAN CATCH Performed at [facility], [address], [place], [place] [address]    Special Requests   Final    NONE Performed at [facility], [address] [place], [place] [address]     Culture >=100,000 COLONIES/mL ESCHERICHIA COLI (A)  Final   Report Status 02/22/2019 FINAL  Final   Organism ID, Bacteria ESCHERICHIA COLI (A)  Final      Susceptibility   Escherichia coli - MIC*    AMPICILLIN 8 SENSITIVE Sensitive     CEFAZOLIN <=4 SENSITIVE Sensitive     CEFTRIAXONE <=0.25 SENSITIVE Sensitive     CIPROFLOXACIN <=0.25 SENSITIVE Sensitive     GENTAMICIN <=1 SENSITIVE Sensitive     IMIPENEM <=0.25 SENSITIVE Sensitive     NITROFURANTOIN <=16 SENSITIVE Sensitive     TRIMETH/SULFA <=20 SENSITIVE Sensitive     AMPICILLIN/SULBACTAM <=2 SENSITIVE Sensitive     PIP/TAZO <=4 SENSITIVE Sensitive     * >=100,000 COLONIES/mL ESCHERICHIA COLI  Surgical pcr screen     Status: None   Collection Time: 02/21/19 12:50 AM   Specimen: Nasal Mucosa; Nasal Swab  Result Value Ref Range Status   MRSA, PCR NEGATIVE NEGATIVE Final   Staphylococcus aureus NEGATIVE NEGATIVE Final    Comment: (NOTE) The Xpert SA Assay (FDA approved for NASAL specimens in patients 73 years of age and older), is one component of a comprehensive surveillance program. It is not intended to diagnose infection nor to guide or monitor treatment. Performed at [facility], [address] [place], [place] [address]   CULTURE, BLOOD (ROUTINE X 2) w Reflex to ID Panel     Status: None   Collection Time: 02/22/19  1:02 PM   Specimen: BLOOD  Result Value Ref Range Status   Specimen Description BLOOD BLOOD RIGHT HAND  Final   Special Requests   Final    BOTTLES DRAWN AEROBIC AND ANAEROBIC Blood Culture results may not be optimal due to an inadequate volume of blood received in culture bottles   Culture   Final    NO GROWTH 5 DAYS Performed at [facility], [address] [place], [place] [address]    Report Status 02/27/2019 FINAL  Final  CULTURE, BLOOD (ROUTINE X 2) w Reflex to ID Panel     Status: None   Collection Time: 02/22/19  1:08 PM   Specimen: BLOOD  Result Value Ref Range Status    Specimen Description BLOOD RIGHT ANTECUBITAL  Final   Special Requests   Final    BOTTLES DRAWN AEROBIC AND  ANAEROBIC Blood Culture adequate volume   Culture   Final    NO GROWTH 5 DAYS Performed at [facility], [address] [place], [place] [address]    Report Status 02/27/2019 FINAL  Final  CULTURE, BLOOD (ROUTINE X 2) w Reflex to ID Panel     Status: None (Preliminary result)   Collection Time: 02/28/19  9:11 AM   Specimen: BLOOD  Result Value Ref Range Status   Specimen Description BLOOD RIGHT ANTECUBITAL  Final   Special Requests   Final    BOTTLES DRAWN AEROBIC AND ANAEROBIC Blood Culture results may not be optimal due to an excessive volume of blood received in culture bottles   Culture   Final    NO GROWTH < 24 HOURS Performed at [facility], [address], [place], [place] [address]    Report Status PENDING  Incomplete  CULTURE, BLOOD (ROUTINE X 2) w Reflex to ID Panel     Status: None (Preliminary result)   Collection Time: 02/28/19  9:20 AM   Specimen: BLOOD  Result Value Ref Range Status   Specimen Description BLOOD LEFT ANTECUBITAL  Final   Special Requests   Final    BOTTLES DRAWN AEROBIC AND ANAEROBIC Blood Culture results may not be optimal due to an excessive volume of blood received in culture bottles   Culture   Final    NO GROWTH < 24 HOURS Performed at [facility], [address], [place], [place] [address]    Report Status PENDING  Incomplete  Urine Culture     Status: Abnormal   Collection Time: 02/28/19 10:33 AM   Specimen: Urine, Clean Catch  Result Value Ref Range Status   Specimen Description   Final    URINE, CLEAN CATCH Performed at [facility], [address], [place], [place] [address]    Special Requests   Final    NONE Performed at [facility], [address], [place], [place] [address]    Culture 40,000 COLONIES/mL YEAST (A)  Final   Report Status 03/01/2019 FINAL  Final          Radiology Studies: [facility] Chest Port 1 View  Result Date: 02/28/2019 CLINICAL DATA:  Fever. EXAM: PORTABLE CHEST 1 VIEW COMPARISON:  Chest x-ray dated February 22, 2019. FINDINGS: The heart size and mediastinal contours are within normal limits. Atherosclerotic calcification of the aortic arch. Normal pulmonary vascularity. No focal consolidation, pleural effusion, or pneumothorax. Unchanged elevation of the right hemidiaphragm. No acute osseous abnormality. IMPRESSION: No active disease. Electronically Signed   By: Obie Dredge M.D.   On: 02/28/2019 09:16        Scheduled Meds: . Chlorhexidine Gluconate Cloth  6 each Topical Daily  . cholecalciferol  2,000 Units Oral Daily  . enoxaparin (LOVENOX) injection  40 mg Subcutaneous Q24H  . famotidine  20 mg Oral QHS  . ferrous sulfate  325 mg Oral TID PC  . levothyroxine  88 mcg Oral Q0600  . LORazepam  0.25 mg Oral BID  . metoprolol tartrate  12.5 mg Oral BID  . multivitamin with minerals   Oral Daily  . potassium chloride  10 mEq Oral BID  . senna  1 tablet Oral BID  . traMADol  50 mg Oral Q6H   Continuous Infusions: . sodium chloride 50 mL/hr at 02/26/19 1508    Assessment & Plan:   Active Problems:   Closed left hip fracture (HCC)   Fever   Protein-calorie  malnutrition, severe   Essential hypertension   Acquired hypothyroidism   1. Left hip fracture- secondary to mechanical fall: S/P  left hip hemiarthroplasty; POD 4 - Pain management   - Cont IV hydration  - PT/OT per Ortho -rec. 24 hr supervision,home health. -Palliative care consulted, will f/u -speech swallow consult -weightbearing as tolerated on the left lower extremity with posterior hip precautions.  -DC  from an orthopedic standpoint  -ortho recommend patient be discharged on Lovenox 40 mg daily next 14 days for DVT prophylaxis  2. UTI with subsequent sepsis:  With leukocytosis and fever On IV Zosyn since there was thought of UTI and aspiration  pneumonia...>completed 5 day course -Urine culture grew E. Coli -Chest x-ray from 2/2 did not reveal aspiration Blood cultures TD negative  3.Fever-new 02/26/18 Possibly due to postop inflammation We will check blood cultures, urine culture, urinalysis. X-ray completed--> negative for acute issues Encouraged incentive spirometer WBC normal UCx with yeast, will give diflucan x1.   3.Hypernatremia-Etiology unclear. Possibly 2/2 solutes from ivf, was switch to 1/2ns improved and then back up again...>ivf d/c;d 2/7, then restarted , now will d/c again as NA up 153 At this point, will consult nephrology for further management as it is difficult trying to stabilize her sodium level Continue to monitor.   4. Hypertensive urgency: Still elevated at times - switched iv beta blk to po - start amlodipine 5mg  po daily Increase metoprolol from 12.5mg  bid to 25mg  bid with parameters.   5. Acute kidney injury superimposed on stage IIIb chronic kidney disease.-- Improved following hydration  - creatinine stabilized, this likely the baseline - Avoid potential nephrotoxins -Continue monitoring  6. Hypothyroidism. -- TSH WNL  - Continue home Synthroid   7.Nutrition- has poor intake Encouraged po intact with assistance  8. Hypokalemia- Replace and resolved Monitor levels -    DVT prophylaxis: Heparin  code Status:DNR Family Communication: none at bedside Disposition Plan: needs covid testing prior to going back to her residence home and however we are unsure of when they will accept her.  Per social work [facility] evaluated patient and declined taking the patient back.  Now we are looking for new rehab per case management.  Also will wait to check Covid once rehab is found.  At this time we still need to keep patient as her sodium levels are increasing and nephrology was consulted.        LOS: 9 days   Time spent: 45 min with more than 50% on coc    Nolberto Hanlon,  MD [facility] Pager [phone]  If 7PM-7AM, please contact night-coverage [email or web address] Password [facility] 03/01/2019, 11:34 AM Patient ID: Izabellah Dadisman, female   DOB: Oct 14, 1927, 86 y.o.   MRN: [id number]

## 2019-03-01 NOTE — TOC Progression Note (Signed)
Transition of Care [facility]) - Progression Note    Patient Details  Name: Sabrina Burke MRN: [id number] Date of Birth: [birth year]  Transition of Care [facility]) CM/SW Contact  Barrie Dunker, RN Phone Number: 03/01/2019, 9:34 AM  Clinical Narrative:     New Bed search sent this morning looking for a facility that can transisition into long term care, will notify the patient's family once offers are obtained  Expected Discharge Plan: Skilled Nursing Facility Barriers to Discharge: Continued Medical Work up  Expected Discharge Plan and Services Expected Discharge Plan: Skilled Nursing Facility In-house Referral: Clinical Social Work Discharge Planning Services: NA   Living arrangements for the past 2 months: Assisted Living Facility([facility])                                       Social Determinants of Health (SDOH) Interventions    Readmission Risk Interventions No flowsheet data found.

## 2019-03-01 NOTE — Consult Note (Signed)
[address] [place], [place] [address] Phone [phone] Fax [phone]  Date: 03/01/2019                  Patient Name:  Sabrina Burke  MRN: [id number]  DOB: 08-11-1927  Age / Sex: 86 y.o., female         PCP: Devoria Glassing, NP                 Service Requesting Consult: im/ Lynn Ito, MD                 Reason for Consult:  Hypernatremia            History of Present Illness: Patient is a 86 y.o. female with medical problems of dementia, hypertension, chronic kidney disease who was admitted to [facility] on 02/20/2019 for evaluation of left hip pain after mechanical fall at [facility].  Most of the information is obtained from the chart and primary team.  Patient has dementia and is not able to provide any meaningful information Patient underwent surgical repair for left femoral neck fracture on February 1 Postoperative course complicated by hyponatremia and acute kidney injury. On admission, sodium of 145 Since then it has ranged between 147-151 Today's level is 153 Nephrology consult has been requested for evaluation Patient is currently on nectar thick liquid diet   Medications: Outpatient medications: Medications Prior to Admission  Medication Sig Dispense Refill Last Dose  . acetaminophen (TYLENOL) 325 MG tablet Take 325 mg by mouth every 6 (six) hours as needed for fever.    prn at prn  . alum & mag hydroxide-simeth (MAALOX/MYLANTA) 200-200-20 MG/5ML suspension Take 30 mLs by mouth as needed for indigestion or heartburn (max 4 doses in 24 hours).    prn at prn  . amLODipine (NORVASC) 10 MG tablet Take 1 tablet (10 mg total) by mouth daily. 30 tablet 0 02/19/2019 at 0900  . aspirin EC 81 MG EC tablet Take 1 tablet (81 mg total) by mouth daily. 30 tablet 0 02/19/2019 at 0900  . bisacodyl (DULCOLAX) 5 MG EC tablet Take 5 mg by mouth every other day as needed for moderate constipation.    prn at prn  . Cholecalciferol (VITAMIN D3)  50 MCG (2000 UT) TABS Take 2,000 Units by mouth daily.    Unknown at Unknown  . docusate sodium (COLACE) 100 MG capsule Take 100 mg by mouth at bedtime.   Unknown at Unknown  . furosemide (LASIX) 20 MG tablet Take 20 mg by mouth daily.   02/20/2019 at 0800  . guaiFENesin (ROBITUSSIN) 100 MG/5ML liquid Take 200 mg by mouth every 6 (six) hours as needed for cough.    prn at prn  . Infant Care Products (DERMACLOUD) CREA Apply 1 application topically 3 (three) times daily. Apply to the buttocks and sacrum after each diaper change. Every shift: 0700-1500, 1500-2300, 2300-0700   Unknown at Unknown  . levothyroxine (SYNTHROID) 88 MCG tablet Take 88 mcg by mouth daily before breakfast.    02/20/2019 at 0600  . loperamide (IMODIUM) 2 MG capsule Take 2 mg by mouth as needed for diarrhea or loose stools (max 8 doses in 24 hours).    prn at prn  . LORazepam (ATIVAN) 0.5 MG tablet Take 0.25 mg by mouth 2 (two) times daily as needed for anxiety.   prn at prn  . LORazepam (ATIVAN) 0.5 MG tablet Take 0.5 tablets (0.25 mg total) by mouth 2 (two) times daily.  Takes at 1400 and 2100 10 tablet 0 02/19/2019 at Unknown time  . magnesium hydroxide (MILK OF MAGNESIA) 400 MG/5ML suspension Take 30 mLs by mouth at bedtime as needed for mild constipation.    prn at prn  . Multiple Vitamin (DAILY VITE PO) Take 1 tablet by mouth daily.   02/20/2019 at 0900  . neomycin-bacitracin-polymyxin (NEOSPORIN) ointment Apply 1 application topically as needed for wound care.   prn at prn  . polyethylene glycol (MIRALAX / GLYCOLAX) 17 g packet Take 17 g by mouth daily as needed for mild constipation. 14 each 0 prn at prn  . potassium chloride (KLOR-CON) 20 MEQ packet Take 20 mEq by mouth daily. 30 packet 0 Unknown at Unknown  . ciprofloxacin (CIPRO) 500 MG tablet Take 1 tablet (500 mg total) by mouth daily at 6 PM. (Patient not taking: Reported on 02/21/2019) 5 tablet 0 Completed Course at Unknown time  . feeding supplement, ENSURE ENLIVE, (ENSURE  ENLIVE) LIQD Take 237 mLs by mouth 2 (two) times daily between meals. 60 Bottle 0     Current medications: Current Facility-Administered Medications  Medication Dose Route Frequency Provider Last Rate Last Admin  . acetaminophen (TYLENOL) suppository 650 mg  650 mg Rectal Q6H PRN Thomasenia BottomsAlam, Tawfikul, MD   650 mg at 02/22/19 1810  . acetaminophen (TYLENOL) tablet 325-650 mg  325-650 mg Oral Q6H PRN Juanell FairlyKrasinski, Kevin, MD   650 mg at 02/27/19 1719  . amLODipine (NORVASC) tablet 5 mg  5 mg Oral Daily Lynn ItoAmery, Sahar, MD   5 mg at 03/01/19 1210  . Chlorhexidine Gluconate Cloth 2 % PADS 6 each  6 each Topical Daily Juanell FairlyKrasinski, Kevin, MD   6 each at 03/01/19 1215  . cholecalciferol (VITAMIN D3) tablet 2,000 Units  2,000 Units Oral Daily Juanell FairlyKrasinski, Kevin, MD   2,000 Units at 03/01/19 [phone]  . enoxaparin (LOVENOX) injection 40 mg  40 mg Subcutaneous Q24H Lynn ItoAmery, Sahar, MD   40 mg at 03/01/19 1210  . famotidine (PEPCID) tablet 20 mg  20 mg Oral QHS Lynn ItoAmery, Sahar, MD   20 mg at 02/28/19 2119  . ferrous sulfate tablet 325 mg  325 mg Oral TID PC Juanell FairlyKrasinski, Kevin, MD   325 mg at 03/01/19 1210  . fluconazole (DIFLUCAN) tablet 150 mg  150 mg Oral Once Lynn ItoAmery, Sahar, MD      . HYDROcodone-acetaminophen (NORCO) 7.5-325 MG per tablet 1-2 tablet  1-2 tablet Oral Q4H PRN Juanell FairlyKrasinski, Kevin, MD      . HYDROcodone-acetaminophen (NORCO/VICODIN) 5-325 MG per tablet 1-2 tablet  1-2 tablet Oral Q4H PRN Juanell FairlyKrasinski, Kevin, MD   1 tablet at 02/23/19 1635  . levothyroxine (SYNTHROID) tablet 88 mcg  88 mcg Oral Q0600 Lynn ItoAmery, Sahar, MD   88 mcg at 03/01/19 0541  . loperamide (IMODIUM) capsule 2 mg  2 mg Oral PRN Juanell FairlyKrasinski, Kevin, MD      . LORazepam (ATIVAN) tablet 0.25 mg  0.25 mg Oral BID PRN Juanell FairlyKrasinski, Kevin, MD      . LORazepam (ATIVAN) tablet 0.25 mg  0.25 mg Oral BID Juanell FairlyKrasinski, Kevin, MD   0.25 mg at 02/28/19 1349  . magnesium citrate solution 1 Bottle  1 Bottle Oral Once PRN Juanell FairlyKrasinski, Kevin, MD      . menthol-cetylpyridinium (CEPACOL)  lozenge 3 mg  1 lozenge Oral PRN Juanell FairlyKrasinski, Kevin, MD       Or  . phenol (CHLORASEPTIC) mouth spray 1 spray  1 spray Mouth/Throat PRN Juanell FairlyKrasinski, Kevin, MD      . methocarbamol (  ROBAXIN) tablet 500 mg  500 mg Oral Q6H PRN Juanell Fairly, MD      . metoprolol tartrate (LOPRESSOR) tablet 25 mg  25 mg Oral BID Lynn Ito, MD      . morphine 4 MG/ML injection 0.52-1 mg  0.52-1 mg Intravenous Q2H PRN Juanell Fairly, MD      . multivitamin with minerals tablet   Oral Daily Juanell Fairly, MD   2 tablet at 03/01/19 5956  . ondansetron (ZOFRAN) tablet 4 mg  4 mg Oral Q6H PRN Juanell Fairly, MD       Or  . ondansetron [facility]) injection 4 mg  4 mg Intravenous Q6H PRN Juanell Fairly, MD      . polyethylene glycol (MIRALAX / GLYCOLAX) packet 17 g  17 g Oral Daily PRN Juanell Fairly, MD      . potassium chloride 20 MEQ/15ML (10%) solution 10 mEq  10 mEq Oral BID Lynn Ito, MD   10 mEq at 03/01/19 0828  . senna (SENOKOT) tablet 8.6 mg  1 tablet Oral BID Juanell Fairly, MD   8.6 mg at 03/01/19 3875  . traMADol (ULTRAM) tablet 50 mg  50 mg Oral Q6H Juanell Fairly, MD   50 mg at 03/01/19 1210  . traZODone (DESYREL) tablet 25 mg  25 mg Oral QHS PRN Juanell Fairly, MD          Allergies: No Known Allergies    Past Medical History: Past Medical History:  Diagnosis Date  . Dementia (HCC)   . Hypertension      Past Surgical History: Past Surgical History:  Procedure Laterality Date  . HIP ARTHROPLASTY Left 02/21/2019   Procedure: LEFT HIP HEMIARTHROPLASTY;  Surgeon: Juanell Fairly, MD;  Location: [facility] ORS;  Service: Orthopedics;  Laterality: Left;     Family History: History reviewed. No pertinent family history.   Social History: Social History   Socioeconomic History  . Marital status: Widowed    Spouse name: Not on file  . Number of children: Not on file  . Years of education: Not on file  . Highest education level: Not on file  Occupational History  . Not on  file  Tobacco Use  . Smoking status: Never Smoker  . Smokeless tobacco: Never Used  . Tobacco comment: pt with advance dementia  Substance and Sexual Activity  . Alcohol use: Not Currently  . Drug use: Not Currently  . Sexual activity: Not on file  Other Topics Concern  . Not on file  Social History Narrative  . Not on file   Social Determinants of Health   Financial Resource Strain:   . Difficulty of Paying Living Expenses: Not on file  Food Insecurity:   . Worried About Programme researcher, broadcasting/film/video in the Last Year: Not on file  . Ran Out of Food in the Last Year: Not on file  Transportation Needs:   . Lack of Transportation (Medical): Not on file  . Lack of Transportation (Non-Medical): Not on file  Physical Activity:   . Days of Exercise per Week: Not on file  . Minutes of Exercise per Session: Not on file  Stress:   . Feeling of Stress : Not on file  Social Connections:   . Frequency of Communication with Friends and Family: Not on file  . Frequency of Social Gatherings with Friends and Family: Not on file  . Attends Religious Services: Not on file  . Active Member of Clubs or Organizations: Not on file  . Attends  Club or Organization Meetings: Not on file  . Marital Status: Not on file  Intimate Partner Violence:   . Fear of Current or Ex-Partner: Not on file  . Emotionally Abused: Not on file  . Physically Abused: Not on file  . Sexually Abused: Not on file     Review of Systems: Not available due to patient having dementia Gen:  HEENT:  CV:  Resp:  GI: GU :  MS:  Derm:    Psych: Heme:  Neuro:  Endocrine  Vital Signs: Blood pressure (!) 141/89, pulse 83, temperature 97.6 F (36.4 C), temperature source Oral, resp. rate 17, height 5\' 2"  (1.575 m), weight 45.4 kg, SpO2 97 %.   Intake/Output Summary (Last 24 hours) at 03/01/2019 1356 Last data filed at 03/01/2019 1300 Gross per 24 hour  Intake 360 ml  Output 700 ml  Net -340 ml    Weight trends: [employer]   02/20/19 1951  Weight: 45.4 kg    Physical Exam: General:  Thin, frail, elderly female, laying in the bed  HEENT  moist oral mucous membranes  Neck:  No masses  Lungs:  Shallow breathing effort, clear to auscultation  Heart::  No rub  Abdomen:  Soft, nontender  Extremities:  No peripheral edema  Neurologic:  Arousable but did not answer questions  Skin:  Decreased turgor    Lab results: Basic Metabolic Panel: Recent Labs  Lab 02/26/19 0604 02/26/19 0604 02/27/19 0716 02/28/19 0128 03/01/19 0407  NA 148*   < > 151* 149* 153*  K 3.3*  --  3.9 3.9  --   CL 115*  --  118* 116*  --   CO2 23  --  25 26  --   GLUCOSE 126*  --  111* 110*  --   BUN 34*  --  34* 36*  --   CREATININE 2.08*  --  2.17* 2.08*  --   CALCIUM 8.3*  --  8.4* 8.6*  --    < > = values in this interval not displayed.    Liver Function Tests: No results for input(s): AST, ALT, ALKPHOS, BILITOT, PROT, ALBUMIN in the last 168 hours. No results for input(s): LIPASE, AMYLASE in the last 168 hours. No results for input(s): AMMONIA in the last 168 hours.  CBC: Recent Labs  Lab 02/24/19 0459 02/24/19 0459 02/27/19 0716 02/28/19 0911  WBC 7.8  --   --  7.8  HGB 8.1*   < > 8.3* 8.7*  HCT 26.3*   < > 27.7* 28.7*  MCV 105.2*  --   --  105.9*  PLT 243  --   --  367   < > = values in this interval not displayed.    Cardiac Enzymes: No results for input(s): CKTOTAL, TROPONINI in the last 168 hours.  BNP: Invalid input(s): POCBNP  CBG: No results for input(s): GLUCAP in the last 168 hours.  Microbiology: Recent Results (from the past 720 hour(s))  Respiratory Panel by RT PCR (Flu A&B, Covid) - Nasopharyngeal Swab     Status: None   Collection Time: 02/20/19  8:36 PM   Specimen: Nasopharyngeal Swab  Result Value Ref Range Status   SARS Coronavirus 2 by RT PCR NEGATIVE NEGATIVE Final    Comment: (NOTE) SARS-CoV-2 target nucleic acids are NOT DETECTED. The SARS-CoV-2 RNA is generally  detectable in upper respiratoy specimens during the acute phase of infection. The lowest concentration of SARS-CoV-2 viral copies this assay can detect is 131  copies/mL. A negative result does not preclude SARS-Cov-2 infection and should not be used as the sole basis for treatment or other patient management decisions. A negative result may occur with  improper specimen collection/handling, submission of specimen other than nasopharyngeal swab, presence of viral mutation(s) within the areas targeted by this assay, and inadequate number of viral copies (<131 copies/mL). A negative result must be combined with clinical observations, patient history, and epidemiological information. The expected result is Negative. Fact Sheet for Patients:  https://www.moore.com/https://www.fda.gov/media/142436/download Fact Sheet for Healthcare Providers:  https://www.young.biz/https://www.fda.gov/media/142435/download This test is not yet ap proved or cleared by the [place] FDA and  has been authorized for detection and/or diagnosis of SARS-CoV-2 by FDA under an Emergency Use Authorization (EUA). This EUA will remain  in effect (meaning this test can be used) for the duration of the COVID-19 declaration under Section 564(b)(1) of the Act, 21 U.S.C. section 360bbb-3(b)(1), unless the authorization is terminated or revoked sooner.    Influenza A by PCR NEGATIVE NEGATIVE Final   Influenza B by PCR NEGATIVE NEGATIVE Final    Comment: (NOTE) The Xpert Xpress SARS-CoV-2/FLU/RSV assay is intended as an aid in  the diagnosis of influenza from Nasopharyngeal swab specimens and  should not be used as a sole basis for treatment. Nasal washings and  aspirates are unacceptable for Xpert Xpress SARS-CoV-2/FLU/RSV  testing. Fact Sheet for Patients: https://www.moore.com/https://www.fda.gov/media/142436/download Fact Sheet for Healthcare Providers: https://www.young.biz/https://www.fda.gov/media/142435/download This test is not yet approved or cleared by the [place] FDA and  has been  authorized for detection and/or diagnosis of SARS-CoV-2 by  FDA under an Emergency Use Authorization (EUA). This EUA will remain  in effect (meaning this test can be used) for the duration of the  Covid-19 declaration under Section 564(b)(1) of the Act, 21  U.S.C. section 360bbb-3(b)(1), unless the authorization is  terminated or revoked. Performed at [facility], [address] [place], [place] [address]   Blood culture (routine x 2)     Status: None   Collection Time: 02/20/19  8:53 PM   Specimen: BLOOD  Result Value Ref Range Status   Specimen Description BLOOD RIGHT ANTECUBITAL  Final   Special Requests   Final    BOTTLES DRAWN AEROBIC AND ANAEROBIC Blood Culture results may not be optimal due to an inadequate volume of blood received in culture bottles   Culture   Final    NO GROWTH 5 DAYS Performed at [facility], [address] [place], [place] [address]    Report Status 02/25/2019 FINAL  Final  Blood culture (routine x 2)     Status: None   Collection Time: 02/20/19  8:53 PM   Specimen: BLOOD  Result Value Ref Range Status   Specimen Description BLOOD BLOOD LEFT HAND  Final   Special Requests   Final    BOTTLES DRAWN AEROBIC AND ANAEROBIC Blood Culture adequate volume   Culture   Final    NO GROWTH 5 DAYS Performed at [facility], [address] [place], [place] [address]    Report Status 02/25/2019 FINAL  Final  Urine Culture     Status: Abnormal   Collection Time: 02/20/19 11:44 PM   Specimen: Urine, Clean Catch  Result Value Ref Range Status   Specimen Description   Final    URINE, CLEAN CATCH Performed at [facility], [address] [place], [place] [address]    Special Requests   Final    NONE Performed at [facility],  [address] [place], [place] [address]    Culture >=100,000 COLONIES/mL ESCHERICHIA COLI (A)  Final   Report Status 02/22/2019 FINAL  Final   Organism ID, Bacteria ESCHERICHIA  COLI (A)  Final      Susceptibility   Escherichia coli - MIC*    AMPICILLIN 8 SENSITIVE Sensitive     CEFAZOLIN <=4 SENSITIVE Sensitive     CEFTRIAXONE <=0.25 SENSITIVE Sensitive     CIPROFLOXACIN <=0.25 SENSITIVE Sensitive     GENTAMICIN <=1 SENSITIVE Sensitive     IMIPENEM <=0.25 SENSITIVE Sensitive     NITROFURANTOIN <=16 SENSITIVE Sensitive     TRIMETH/SULFA <=20 SENSITIVE Sensitive     AMPICILLIN/SULBACTAM <=2 SENSITIVE Sensitive     PIP/TAZO <=4 SENSITIVE Sensitive     * >=100,000 COLONIES/mL ESCHERICHIA COLI  Surgical pcr screen     Status: None   Collection Time: 02/21/19 12:50 AM   Specimen: Nasal Mucosa; Nasal Swab  Result Value Ref Range Status   MRSA, PCR NEGATIVE NEGATIVE Final   Staphylococcus aureus NEGATIVE NEGATIVE Final    Comment: (NOTE) The Xpert SA Assay (FDA approved for NASAL specimens in patients 80 years of age and older), is one component of a comprehensive surveillance program. It is not intended to diagnose infection nor to guide or monitor treatment. Performed at [facility], [address] [place], [place] [address]   CULTURE, BLOOD (ROUTINE X 2) w Reflex to ID Panel     Status: None   Collection Time: 02/22/19  1:02 PM   Specimen: BLOOD  Result Value Ref Range Status   Specimen Description BLOOD BLOOD RIGHT HAND  Final   Special Requests   Final    BOTTLES DRAWN AEROBIC AND ANAEROBIC Blood Culture results may not be optimal due to an inadequate volume of blood received in culture bottles   Culture   Final    NO GROWTH 5 DAYS Performed at [facility], [address] [place], [place] [address]    Report Status 02/27/2019 FINAL  Final  CULTURE, BLOOD (ROUTINE X 2) w Reflex to ID Panel     Status: None   Collection Time: 02/22/19  1:08 PM   Specimen: BLOOD  Result Value Ref Range Status   Specimen Description BLOOD RIGHT ANTECUBITAL  Final   Special Requests   Final    BOTTLES DRAWN AEROBIC AND ANAEROBIC Blood Culture  adequate volume   Culture   Final    NO GROWTH 5 DAYS Performed at [facility], [address] [place], [place] [address]    Report Status 02/27/2019 FINAL  Final  CULTURE, BLOOD (ROUTINE X 2) w Reflex to ID Panel     Status: None (Preliminary result)   Collection Time: 02/28/19  9:11 AM   Specimen: BLOOD  Result Value Ref Range Status   Specimen Description BLOOD RIGHT ANTECUBITAL  Final   Special Requests   Final    BOTTLES DRAWN AEROBIC AND ANAEROBIC Blood Culture results may not be optimal due to an excessive volume of blood received in culture bottles   Culture   Final    NO GROWTH < 24 HOURS Performed at [facility], [address] [place], [place] [address]    Report Status PENDING  Incomplete  CULTURE, BLOOD (ROUTINE X 2) w Reflex to ID Panel     Status: None (Preliminary result)   Collection Time: 02/28/19  9:20 AM   Specimen: BLOOD  Result Value Ref Range Status   Specimen  Description BLOOD LEFT ANTECUBITAL  Final   Special Requests   Final    BOTTLES DRAWN AEROBIC AND ANAEROBIC Blood Culture results may not be optimal due to an excessive volume of blood received in culture bottles   Culture   Final    NO GROWTH < 24 HOURS Performed at [facility], [address] [place], [place] [address]    Report Status PENDING  Incomplete  Urine Culture     Status: Abnormal   Collection Time: 02/28/19 10:33 AM   Specimen: Urine, Clean Catch  Result Value Ref Range Status   Specimen Description   Final    URINE, CLEAN CATCH Performed at [facility], [address], [place], [place] [address]    Special Requests   Final    NONE Performed at [facility], [address] [place], [place] [address]    Culture 40,000 COLONIES/mL YEAST (A)  Final   Report Status 03/01/2019 FINAL  Final     Coagulation Studies: No results for input(s): LABPROT, INR in the last 72 hours.  Urinalysis: Recent Labs    02/28/19 1034   COLORURINE YELLOW*  LABSPEC 1.012  PHURINE 5.0  GLUCOSEU NEGATIVE  HGBUR NEGATIVE  BILIRUBINUR NEGATIVE  KETONESUR NEGATIVE  PROTEINUR 30*  NITRITE NEGATIVE  LEUKOCYTESUR SMALL*        Imaging: DG Chest Port 1 View  Result Date: 02/28/2019 CLINICAL DATA:  Fever. EXAM: PORTABLE CHEST 1 VIEW COMPARISON:  Chest x-ray dated February 22, 2019. FINDINGS: The heart size and mediastinal contours are within normal limits. Atherosclerotic calcification of the aortic arch. Normal pulmonary vascularity. No focal consolidation, pleural effusion, or pneumothorax. Unchanged elevation of the right hemidiaphragm. No acute osseous abnormality. IMPRESSION: No active disease. Electronically Signed   By: Obie Dredge M.D.   On: 02/28/2019 09:16      Assessment & Plan: Pt is a 86 y.o. Caucasian   female with hypertension and dementia, was admitted on 02/20/2019 with left hip fracture status post surgical repair/hip arthroplasty February 2.  Hospital course complicated by hypernatremia and acute kidney injury on chronic kidney disease  #Hypernatremia Likely secondary to Poor oral intake Patient is currently on nectar thick liquid diet and with her dementia, oral intake may not be adequate For now, and IV D5W until adequate oral fluid intake can be established  #Chronic kidney disease stage IIIb Baseline creatinine of 1.31/GFR 36 on October 24, 2018 Underlying CKD is likely secondary to atherosclerosis -Acute kidney injury noted at time of admission.  Creatinine of 2.01 which improved to 1.6 on February 2.  Since then creatinine has been elevated and is stabilizing at 2.08-2.17 We will see if serum creatinine improves with IV hydration Patient has completed treatment with antibiotics for urinary tract infection       LOS: 9 Mylon Mabey 2/9/20211:56 PM    Note: This note was prepared with Dragon dictation. Any transcription errors are unintentional

## 2019-03-01 NOTE — TOC Progression Note (Signed)
Transition of Care [facility]) - Progression Note    Patient Details  Name: Sabrina Burke MRN: [id number] Date of Birth: 11/09/1927  Transition of Care [facility] CM/SW Contact  Barrie Dunker, RN Phone Number: 03/01/2019, 2:13 PM  Clinical Narrative:    Daughter Lea called and said she is awaiting Tammy from [facility] to call her bak to answer questions about financial and cost of facility, She will call back after they have discussion with a answer to if they will accept the bed offer or not   Expected Discharge Plan: Skilled Nursing Facility Barriers to Discharge: Continued Medical Work up  Expected Discharge Plan and Services Expected Discharge Plan: Skilled Nursing Facility In-house Referral: Clinical Social Work Discharge Planning Services: NA   Living arrangements for the past 2 months: Assisted Living Facility([facility])                                       Social Determinants of Health (SDOH) Interventions    Readmission Risk Interventions No flowsheet data found.

## 2019-03-02 DIAGNOSIS — E43 Unspecified severe protein-calorie malnutrition: Secondary | ICD-10-CM

## 2019-03-02 LAB — COMPREHENSIVE METABOLIC PANEL
ALT: 8 U/L (ref 0–44)
AST: 15 U/L (ref 15–41)
Albumin: 2.2 g/dL — ABNORMAL LOW (ref 3.5–5.0)
Alkaline Phosphatase: 46 U/L (ref 38–126)
Anion gap: 5 (ref 5–15)
BUN: 37 mg/dL — ABNORMAL HIGH (ref 8–23)
CO2: 29 mmol/L (ref 22–32)
Calcium: 8.8 mg/dL — ABNORMAL LOW (ref 8.9–10.3)
Chloride: 120 mmol/L — ABNORMAL HIGH (ref 98–111)
Creatinine, Ser: 1.88 mg/dL — ABNORMAL HIGH (ref 0.44–1.00)
GFR calc Af Amer: 27 mL/min — ABNORMAL LOW (ref >60)
GFR calc non Af Amer: 23 mL/min — ABNORMAL LOW (ref >60)
Glucose, Bld: 145 mg/dL — ABNORMAL HIGH (ref 70–99)
Potassium: 4.2 mmol/L (ref 3.5–5.1)
Sodium: 154 mmol/L — ABNORMAL HIGH (ref 135–145)
Total Bilirubin: 0.3 mg/dL (ref 0.3–1.2)
Total Protein: 6.4 g/dL — ABNORMAL LOW (ref 6.5–8.1)

## 2019-03-02 LAB — BASIC METABOLIC PANEL
Anion gap: 7 (ref 5–15)
BUN: 34 mg/dL — ABNORMAL HIGH (ref 8–23)
CO2: 27 mmol/L (ref 22–32)
Calcium: 8.6 mg/dL — ABNORMAL LOW (ref 8.9–10.3)
Chloride: 115 mmol/L — ABNORMAL HIGH (ref 98–111)
Creatinine, Ser: 1.99 mg/dL — ABNORMAL HIGH (ref 0.44–1.00)
GFR calc Af Amer: 25 mL/min — ABNORMAL LOW (ref >60)
GFR calc non Af Amer: 22 mL/min — ABNORMAL LOW (ref >60)
Glucose, Bld: 145 mg/dL — ABNORMAL HIGH (ref 70–99)
Potassium: 4.3 mmol/L (ref 3.5–5.1)
Sodium: 149 mmol/L — ABNORMAL HIGH (ref 135–145)

## 2019-03-02 LAB — SARS CORONAVIRUS 2 (TAT 6-24 HRS): SARS Coronavirus 2: NEGATIVE

## 2019-03-02 MED ORDER — MINERAL OIL RE ENEM
1.0000 | ENEMA | Freq: Once | RECTAL | Status: AC
  Administered 2019-03-02: 18:00:00 1 via RECTAL

## 2019-03-02 MED ORDER — HEPARIN SODIUM (PORCINE) 5000 UNIT/ML IJ SOLN
5000.0000 [IU] | Freq: Three times a day (TID) | INTRAMUSCULAR | Status: DC
  Administered 2019-03-02 – 2019-03-03 (×4): 5000 [IU] via SUBCUTANEOUS
  Filled 2019-03-02 (×4): qty 1

## 2019-03-02 MED ORDER — BISACODYL 10 MG RE SUPP
10.0000 mg | Freq: Once | RECTAL | Status: AC
  Administered 2019-03-02: 10 mg via RECTAL
  Filled 2019-03-02: qty 1

## 2019-03-02 NOTE — Progress Notes (Addendum)
PROGRESS NOTE    [name]  FPO:[id number] DOB: 03-May-1927 DOA: 02/20/2019  PCP: Devoria Glassing, NP    LOS - 10   Brief Narrative:  Sabrina Burke  is a 86 y.o. pleasantly demented Caucasian female with a known history of dementia and hypertension, who presented to the emergency room with acute onset of left hip pain after having a mechanical fall at [facility].  She underwent left hip hemiarthroplasty on 02/21/19.  Subjective 2/10: Patient seen this AM, awake sitting up in bed.  No acute events reported.  Patient says she feels pretty good.  Due to dementia, patient unable to provide detailed history.  She says she is not in any pain right now.  Assessment & Plan:   Active Problems:   Closed left hip fracture (HCC)   Fever   Protein-calorie malnutrition, severe   Essential hypertension   Acquired hypothyroidism   Hypernatremia -due to poor PO intake, free water deficit.  Na 153 yesterday -> 154 this AM.   --Increased rate of D5W from 50 to 100 cc/hr.  Nephrology consulted previously.  Left hip fracture- secondary to mechanical fall: S/P  left hip hemiarthroplasty; POD 4 - Pain management   - PT/OT per Ortho -rec. 24 hr supervision,home health. -Palliative care consulted, will f/u - weightbearing as tolerated on the left lower extremity with posterior hip precautions.  -DC  from an orthopedic standpoint  -ortho recommend patient be discharged on Lovenox 40 mg daily next 14 days for DVT prophylaxis   Sepsis secondary to UTI - present on admission.  Sepsis as evidenced by HR 105, RR 32, Temp 102, UA consistent with UTI. On IV Zosyn since there was thought of UTI and aspiration pneumonia...>completed 5 day course -Urine culture grew E. Coli -Chest x-ray from 2/2 did not reveal aspiration Blood cultures TD negative -on 2/7 patient again had fever, UA obtained showed yeast, treated with Diflucan   Fever-new 02/26/18. Infectious workup: check blood  cultures, cxr, urinalysis/culture. Chest X-ray completed--> negative for acute issues Encouraged incentive spirometer WBC normal UCx with yeast, treated with diflucan x1.   Hypertensive urgency: Still elevated at times --continue amlodipine 5 mg daily --continue metoprolol 25 mg BID w/hold parameters    Acute kidney injury superimposed on stage IIIb chronic kidney disease.  -- Improved following hydration  - creatinine stabilized, this likely the baseline - Avoid potential nephrotoxins -Continue monitoring   Hypothyroidism.  -- TSH WNL  - Continue home Synthroid     Nutrition- has poor intake Encouraged po intact with assistance   Hypokalemia- Replace and resolved Monitor levels   DVT prophylaxis: Lovenox   Code Status: DNR  Family Communication: none at bedside   Disposition Plan:  To SNF pending further improvement in hypernatremia, and pending bed available. Coming From: [facility] (ALF) Exp DC Date 03/04/19 Barriers: Hypernatremia, SNF  bed Medically Stable for Discharge? No   Consultants:  Orthopedics Nephrology  Procedures:  02/21/19 - left hip hemiarthroplasty  Antimicrobials:  Zosyn x5 days completed 02/25/2019    Objective: Vitals:   03/01/19 2012 03/01/19 2023 03/01/19 2356 03/02/19 0741  BP: (!) 176/79 (!) 151/104 (!) 154/81 (!) 178/66  Pulse: 75 (!) 104 69 63  Resp: (!) 8 12 17 16   Temp: 97.6 F (36.4 C) 98.2 F (36.8 C) 97.7 F (36.5 C) 98 F (36.7 C)  TempSrc: Oral Oral Oral Oral  SpO2: 96% 99% 96% 99%  Weight:      Height:  Intake/Output Summary (Last 24 hours) at 03/02/2019 0955 Last data filed at 03/02/2019 0900 Gross per 24 hour  Intake 600 ml  Output 605 ml  Net -5 ml   Filed Weights   02/20/19 1951  Weight: 45.4 kg    Examination:  General exam: awake, alert, no acute distress, frail HEENT: dry mucus membranes, hearing impaired Respiratory system: CTAB, no wheezes, rales or rhonchi, normal respiratory  effort. Cardiovascular system: normal S1/S2, RRR, no pedal edema.   Gastrointestinal system: soft, NT, ND, no HSM felt, +bowel sounds. Central nervous system: alert, oriented to self, no gross focal neurologic deficits, normal speech Extremities: moves all, no edema, normal tone Skin: dry, intact, normal temperature, normal color Psychiatry: normal mood, congruent affect, abnormal judgement and insight due to baseline dementia    Data Reviewed: I have personally reviewed following labs and imaging studies  CBC: Recent Labs  Lab 02/24/19 0459 02/27/19 0716 02/28/19 0911  WBC 7.8  --  7.8  HGB 8.1* 8.3* 8.7*  HCT 26.3* 27.7* 28.7*  MCV 105.2*  --  105.9*  PLT 243  --  367   Basic Metabolic Panel: Recent Labs  Lab 02/25/19 0444 02/25/19 0444 02/26/19 0604 02/27/19 0716 02/28/19 0128 03/01/19 0407 03/02/19 0258  NA 150*   < > 148* 151* 149* 153* 154*  K 3.6  --  3.3* 3.9 3.9  --  4.2  CL 116*  --  115* 118* 116*  --  120*  CO2 26  --  23 25 26   --  29  GLUCOSE 122*  --  126* 111* 110*  --  145*  BUN 36*  --  34* 34* 36*  --  37*  CREATININE 2.20*  --  2.08* 2.17* 2.08*  --  1.88*  CALCIUM 8.5*  --  8.3* 8.4* 8.6*  --  8.8*   < > = values in this interval not displayed.   GFR: Estimated Creatinine Clearance: 14.5 mL/min (A) (by C-G formula based on SCr of 1.88 mg/dL (H)). Liver Function Tests: Recent Labs  Lab 03/02/19 0258  AST 15  ALT 8  ALKPHOS 46  BILITOT 0.3  PROT 6.4*  ALBUMIN 2.2*   No results for input(s): LIPASE, AMYLASE in the last 168 hours. No results for input(s): AMMONIA in the last 168 hours. Coagulation Profile: No results for input(s): INR, PROTIME in the last 168 hours. Cardiac Enzymes: No results for input(s): CKTOTAL, CKMB, CKMBINDEX, TROPONINI in the last 168 hours. BNP (last 3 results) No results for input(s): PROBNP in the last 8760 hours. HbA1C: No results for input(s): HGBA1C in the last 72 hours. CBG: No results for input(s):  GLUCAP in the last 168 hours. Lipid Profile: No results for input(s): CHOL, HDL, LDLCALC, TRIG, CHOLHDL, LDLDIRECT in the last 72 hours. Thyroid Function Tests: No results for input(s): TSH, T4TOTAL, FREET4, T3FREE, THYROIDAB in the last 72 hours. Anemia Panel: No results for input(s): VITAMINB12, FOLATE, FERRITIN, TIBC, IRON, RETICCTPCT in the last 72 hours. Sepsis Labs: No results for input(s): PROCALCITON, LATICACIDVEN in the last 168 hours.  Recent Results (from the past 240 hour(s))  Respiratory Panel by RT PCR (Flu A&B, Covid) - Nasopharyngeal Swab     Status: None   Collection Time: 02/20/19  8:36 PM   Specimen: Nasopharyngeal Swab  Result Value Ref Range Status   SARS Coronavirus 2 by RT PCR NEGATIVE NEGATIVE Final    Comment: (NOTE) SARS-CoV-2 target nucleic acids are NOT DETECTED. The SARS-CoV-2 RNA is generally detectable  in upper respiratoy specimens during the acute phase of infection. The lowest concentration of SARS-CoV-2 viral copies this assay can detect is 131 copies/mL. A negative result does not preclude SARS-Cov-2 infection and should not be used as the sole basis for treatment or other patient management decisions. A negative result may occur with  improper specimen collection/handling, submission of specimen other than nasopharyngeal swab, presence of viral mutation(s) within the areas targeted by this assay, and inadequate number of viral copies (<131 copies/mL). A negative result must be combined with clinical observations, patient history, and epidemiological information. The expected result is Negative. Fact Sheet for Patients:  https://www.moore.com/ Fact Sheet for Healthcare Providers:  https://www.young.biz/ This test is not yet ap proved or cleared by the [place] FDA and  has been authorized for detection and/or diagnosis of SARS-CoV-2 by FDA under an Emergency Use Authorization (EUA). This EUA will remain   in effect (meaning this test can be used) for the duration of the COVID-19 declaration under Section 564(b)(1) of the Act, 21 U.S.C. section 360bbb-3(b)(1), unless the authorization is terminated or revoked sooner.    Influenza A by PCR NEGATIVE NEGATIVE Final   Influenza B by PCR NEGATIVE NEGATIVE Final    Comment: (NOTE) The Xpert Xpress SARS-CoV-2/FLU/RSV assay is intended as an aid in  the diagnosis of influenza from Nasopharyngeal swab specimens and  should not be used as a sole basis for treatment. Nasal washings and  aspirates are unacceptable for Xpert Xpress SARS-CoV-2/FLU/RSV  testing. Fact Sheet for Patients: https://www.moore.com/ Fact Sheet for Healthcare Providers: https://www.young.biz/ This test is not yet approved or cleared by the [place] FDA and  has been authorized for detection and/or diagnosis of SARS-CoV-2 by  FDA under an Emergency Use Authorization (EUA). This EUA will remain  in effect (meaning this test can be used) for the duration of the  Covid-19 declaration under Section 564(b)(1) of the Act, 21  U.S.C. section 360bbb-3(b)(1), unless the authorization is  terminated or revoked. Performed at [facility], [address] [place], [place] [address]   Blood culture (routine x 2)     Status: None   Collection Time: 02/20/19  8:53 PM   Specimen: BLOOD  Result Value Ref Range Status   Specimen Description BLOOD RIGHT ANTECUBITAL  Final   Special Requests   Final    BOTTLES DRAWN AEROBIC AND ANAEROBIC Blood Culture results may not be optimal due to an inadequate volume of blood received in culture bottles   Culture   Final    NO GROWTH 5 DAYS Performed at [facility], [address], [place], [place] [address]    Report Status 02/25/2019 FINAL  Final  Blood culture (routine x 2)     Status: None   Collection Time: 02/20/19  8:53 PM   Specimen: BLOOD  Result Value Ref Range Status    Specimen Description BLOOD BLOOD LEFT HAND  Final   Special Requests   Final    BOTTLES DRAWN AEROBIC AND ANAEROBIC Blood Culture adequate volume   Culture   Final    NO GROWTH 5 DAYS Performed at [facility], [address] [place], [place] [address]    Report Status 02/25/2019 FINAL  Final  Urine Culture     Status: Abnormal   Collection Time: 02/20/19 11:44 PM   Specimen: Urine, Clean Catch  Result Value Ref Range Status   Specimen Description   Final    URINE, CLEAN CATCH Performed at [facility],  [place], [place] [address]    Special Requests   Final    NONE Performed at [facility], [address] [place], [place] [address]    Culture >=100,000 COLONIES/mL ESCHERICHIA COLI (A)  Final   Report Status 02/22/2019 FINAL  Final   Organism ID, Bacteria ESCHERICHIA COLI (A)  Final      Susceptibility   Escherichia coli - MIC*    AMPICILLIN 8 SENSITIVE Sensitive     CEFAZOLIN <=4 SENSITIVE Sensitive     CEFTRIAXONE <=0.25 SENSITIVE Sensitive     CIPROFLOXACIN <=0.25 SENSITIVE Sensitive     GENTAMICIN <=1 SENSITIVE Sensitive     IMIPENEM <=0.25 SENSITIVE Sensitive     NITROFURANTOIN <=16 SENSITIVE Sensitive     TRIMETH/SULFA <=20 SENSITIVE Sensitive     AMPICILLIN/SULBACTAM <=2 SENSITIVE Sensitive     PIP/TAZO <=4 SENSITIVE Sensitive     * >=100,000 COLONIES/mL ESCHERICHIA COLI  Surgical pcr screen     Status: None   Collection Time: 02/21/19 12:50 AM   Specimen: Nasal Mucosa; Nasal Swab  Result Value Ref Range Status   MRSA, PCR NEGATIVE NEGATIVE Final   Staphylococcus aureus NEGATIVE NEGATIVE Final    Comment: (NOTE) The Xpert SA Assay (FDA approved for NASAL specimens in patients 66 years of age and older), is one component of a comprehensive surveillance program. It is not intended to diagnose infection nor to guide or monitor treatment. Performed at [facility], [address] [place], [place] [address]    CULTURE, BLOOD (ROUTINE X 2) w Reflex to ID Panel     Status: None   Collection Time: 02/22/19  1:02 PM   Specimen: BLOOD  Result Value Ref Range Status   Specimen Description BLOOD BLOOD RIGHT HAND  Final   Special Requests   Final    BOTTLES DRAWN AEROBIC AND ANAEROBIC Blood Culture results may not be optimal due to an inadequate volume of blood received in culture bottles   Culture   Final    NO GROWTH 5 DAYS Performed at [facility], [address] [place], [place] [address]    Report Status 02/27/2019 FINAL  Final  CULTURE, BLOOD (ROUTINE X 2) w Reflex to ID Panel     Status: None   Collection Time: 02/22/19  1:08 PM   Specimen: BLOOD  Result Value Ref Range Status   Specimen Description BLOOD RIGHT ANTECUBITAL  Final   Special Requests   Final    BOTTLES DRAWN AEROBIC AND ANAEROBIC Blood Culture adequate volume   Culture   Final    NO GROWTH 5 DAYS Performed at [facility], [address] [place], [place] [address]    Report Status 02/27/2019 FINAL  Final  CULTURE, BLOOD (ROUTINE X 2) w Reflex to ID Panel     Status: None (Preliminary result)   Collection Time: 02/28/19  9:11 AM   Specimen: BLOOD  Result Value Ref Range Status   Specimen Description BLOOD RIGHT ANTECUBITAL  Final   Special Requests   Final    BOTTLES DRAWN AEROBIC AND ANAEROBIC Blood Culture results may not be optimal due to an excessive volume of blood received in culture bottles   Culture   Final    NO GROWTH 2 DAYS Performed at [facility], [address] [place],  [address]    Report Status PENDING  Incomplete  CULTURE, BLOOD (ROUTINE X 2) w Reflex to ID Panel     Status: None (Preliminary  result)   Collection Time: 02/28/19  9:20 AM   Specimen: BLOOD  Result Value Ref Range Status   Specimen Description BLOOD LEFT ANTECUBITAL  Final   Special Requests   Final    BOTTLES DRAWN AEROBIC AND ANAEROBIC Blood Culture results may not be optimal due to an  excessive volume of blood received in culture bottles   Culture   Final    NO GROWTH 2 DAYS Performed at [facility], [address] [place], [place] [address]    Report Status PENDING  Incomplete  Urine Culture     Status: Abnormal   Collection Time: 02/28/19 10:33 AM   Specimen: Urine, Clean Catch  Result Value Ref Range Status   Specimen Description   Final    URINE, CLEAN CATCH Performed at [facility], [address] [place], [place] [address]    Special Requests   Final    NONE Performed at [facility], [address], [place], [place] [address]    Culture 40,000 COLONIES/mL YEAST (A)  Final   Report Status 03/01/2019 FINAL  Final         Radiology Studies: No results found.      Scheduled Meds:  amLODipine  5 mg Oral Daily   Chlorhexidine Gluconate Cloth  6 each Topical Daily   cholecalciferol  2,000 Units Oral Daily   famotidine  20 mg Oral QHS   ferrous sulfate  325 mg Oral TID PC   heparin injection (subcutaneous)  5,000 Units Subcutaneous Q8H   levothyroxine  88 mcg Oral Q0600   LORazepam  0.25 mg Oral BID   metoprolol tartrate  25 mg Oral BID   multivitamin with minerals   Oral Daily   potassium chloride  10 mEq Oral BID   senna  1 tablet Oral BID   traMADol  50 mg Oral Q6H   Continuous Infusions:  dextrose 100 mL/hr at 03/02/19 0847     LOS: 10 days    Time spent: 30 minutes    Pennie Banter, DO [facility]   If 7PM-7AM, please contact night-coverage [email or web address] 03/02/2019, 9:55 AM

## 2019-03-02 NOTE — Progress Notes (Signed)
Physical Therapy Treatment Patient Details Name: Sabrina Burke MRN: [id number] DOB: [birth year] Today's Date: 03/02/2019    History of Present Illness Pt admitted for L hip fracture s/p falling out of bed at ALF. Pt is now s/p L hip hemiarthroplasty, post approach on 02/21/19. Has history of dementia and HTN.    PT Comments    Pt was supine in bed upon arriving. " Good morning." " I feel good today." She was more talkative during session and was able to follow simple one step commands better however still inconsistent. Cognition/pain limiting progress with PT. She was able to sit EOB x 3 minutes with + 2 assist for safety. Increased time required with max vcs and tactile cues for safety. She continues to have posterior push and gets slightly agitated when sitting EOB. Once returned to supine she participated in exercises and was able to tolerate better this date versus previously observed( see exercises listed below). PT will continue to follow per POC and progress pt as able. Recommend SNF at this time so pt can return to PLOF.       Follow Up Recommendations  SNF     Equipment Recommendations       Recommendations for Other Services       Precautions / Restrictions Precautions Precautions: Fall;Posterior Hip Precaution Booklet Issued: No Required Braces or Orthoses: Other Brace(abduction pillow) Other Brace: abd pillow Restrictions Weight Bearing Restrictions: Yes LLE Weight Bearing: Weight bearing as tolerated    Mobility  Bed Mobility Overal bed mobility: Needs Assistance Bed Mobility: Supine to Sit;Sit to Supine     Supine to sit: Total assist;+2 for physical assistance Sit to supine: +2 for safety/equipment;+2 for physical assistance;Total assist   General bed mobility comments: pt was able to sit EOB x 3 minutes this date but continues to present with severe posterior push. she continues to have anxiety/pain with all movements and becomes slightly aggitated when not  quickly returned to supine when requesting to. Pt did participate in getting to EOB but still required +2 for safety.  Transfers                 General transfer comment: unsafe to trial at this time  Ambulation/Gait                 Stairs             Wheelchair Mobility    Modified Rankin (Stroke Patients Only)       Balance Overall balance assessment: History of Falls Sitting-balance support: Bilateral upper extremity supported;Feet supported Sitting balance-Leahy Scale: Zero Sitting balance - Comments: pt with severe posterior push requiring constant assist to maintain EOB sitting. Vcs throughout for forward wt shift however pt unable to follow commands and is unable to wt shift fwd.                                    Cognition Arousal/Alertness: Awake/alert Behavior During Therapy: WFL for tasks assessed/performed Overall Cognitive Status: History of cognitive impairments - at baseline                                 General Comments: Pt in good spirits this date however continues to only be oriented to self. She follows commands more this date but overall continues to be inconsistant. She does become aggitated with sitting  EOB most likely 2/2 to pain      Exercises General Exercises - Lower Extremity Ankle Circles/Pumps: AROM;Both;5 reps;Supine Quad Sets: Strengthening;Both;5 reps Heel Slides: AAROM;Strengthening;Left;10 reps;Supine(through minimal ROM per pt tolerance) Hip ABduction/ADduction: AAROM;Strengthening;Left;10 reps;Supine Straight Leg Raises: AAROM;PROM;5 reps;Left(through minimal ROM per pt tolerance)    General Comments        Pertinent Vitals/Pain Faces Pain Scale: Hurts whole lot Pain Location: knee/hip LLE with movement Pain Descriptors / Indicators: Sharp;Guarding;Discomfort Pain Intervention(s): Other (comment);Limited activity within patient's tolerance;Monitored during session(pt unable to  rate pain 2/2 to cognition)    Home Living                      Prior Function            PT Goals (current goals can now be found in the care plan section) Acute Rehab PT Goals Patient Stated Goal: Pt unable to state Progress towards PT goals: Progressing toward goals(slow progress 2/2 to cognition deficits/ pain)    Frequency    7X/week      PT Plan Current plan remains appropriate    Co-evaluation              AM-PAC PT "6 Clicks" Mobility   Outcome Measure  Help needed turning from your back to your side while in a flat bed without using bedrails?: Total Help needed moving from lying on your back to sitting on the side of a flat bed without using bedrails?: Total Help needed moving to and from a bed to a chair (including a wheelchair)?: Total Help needed standing up from a chair using your arms (e.g., wheelchair or bedside chair)?: Total Help needed to walk in hospital room?: Total Help needed climbing 3-5 steps with a railing? : Total 6 Click Score: 6    End of Session   Activity Tolerance: Patient limited by pain;Patient tolerated treatment well Patient left: in bed;with call bell/phone within reach;with bed alarm set;with SCD's reapplied;Other (comment)(abduction pillow placed) Nurse Communication: Mobility status PT Visit Diagnosis: Muscle weakness (generalized) (M62.81);History of falling (Z91.81)     Time: 6269-4854 PT Time Calculation (min) (ACUTE ONLY): 14 min  Charges:  $Therapeutic Activity: 8-22 mins                     Jetta Lout PTA 03/02/19, 11:55 AM

## 2019-03-02 NOTE — Progress Notes (Signed)
  Subjective:  POD #9 s/p left hip hemiarthroplasty.   Patient reports left hip pain as mild.  Patient with hypernatremia.  No recent fevers.  Objective:   VITALS:   Vitals:   03/01/19 2012 03/01/19 2023 03/01/19 2356 03/02/19 0741  BP: (!) 176/79 (!) 151/104 (!) 154/81 (!) 178/66  Pulse: 75 (!) 104 69 63  Resp: (!) 8 12 17 16   Temp: 97.6 F (36.4 C) 98.2 F (36.8 C) 97.7 F (36.5 C) 98 F (36.7 C)  TempSrc: Oral Oral Oral Oral  SpO2: 96% 99% 96% 99%  Weight:      Height:        PHYSICAL EXAM: Left lower extremity Neurovascular intact Sensation intact distally Intact pulses distally Dorsiflexion/Plantar flexion intact Incision: scant drainage No cellulitis present Compartment soft  LABS  Results for orders placed or performed during the hospital encounter of 02/20/19 (from the past 24 hour(s))  Comprehensive metabolic panel     Status: Abnormal   Collection Time: 03/02/19  2:58 AM  Result Value Ref Range   Sodium 154 (H) 135 - 145 mmol/L   Potassium 4.2 3.5 - 5.1 mmol/L   Chloride 120 (H) 98 - 111 mmol/L   CO2 29 22 - 32 mmol/L   Glucose, Bld 145 (H) 70 - 99 mg/dL   BUN 37 (H) 8 - 23 mg/dL   Creatinine, Ser 04/30/19 (H) 0.44 - 1.00 mg/dL   Calcium 8.8 (L) 8.9 - 10.3 mg/dL   Total Protein 6.4 (L) 6.5 - 8.1 g/dL   Albumin 2.2 (L) 3.5 - 5.0 g/dL   AST 15 15 - 41 U/L   ALT 8 0 - 44 U/L   Alkaline Phosphatase 46 38 - 126 U/L   Total Bilirubin 0.3 0.3 - 1.2 mg/dL   GFR calc non Af Amer 23 (L) >60 mL/min   GFR calc Af Amer 27 (L) >60 mL/min   Anion gap 5 5 - 15    No results found.  Assessment/Plan: 9 Days Post-Op   Active Problems:   Closed left hip fracture (HCC)   Fever   Protein-calorie malnutrition, severe   Essential hypertension   Acquired hypothyroidism  Patient stable from an orthopaedic standpoint.  Continue PT and lovenox.  Patient is WBAT on the left lower extremity with posterior hip precautions.    0.76 , MD 03/02/2019, 12:30  PM

## 2019-03-02 NOTE — TOC Progression Note (Signed)
Transition of Care [facility]) - Progression Note    Patient Details  Name: Sabrina Burke MRN: [id number] Date of Birth: [birth year]  [facility] CM/SW Contact  Adrijana Haros, Lemar Livings, LCSW Phone Number: 03/02/2019, 9:16 AM  Clinical Narrative:  Spoke with daughter-lea via telephone who has accepted the bed at [facility]. MD reports pt will be medically ready tomorrow. Daughter made aware. MD to order new COVID test. Work toward transfer tomorrow.    Expected Discharge Plan: Skilled Nursing Facility Barriers to Discharge: Continued Medical Work up  Expected Discharge Plan and Services Expected Discharge Plan: Skilled Nursing Facility In-house Referral: Clinical Social Work Discharge Planning Services: NA   Living arrangements for the past 2 months: Assisted Living Facility([facility])                                       Social Determinants of Health (SDOH) Interventions    Readmission Risk Interventions No flowsheet data found.

## 2019-03-02 NOTE — Consult Note (Signed)
[address] [place], [place] [address] Phone [phone]. Fax [phone]  Date: 03/02/2019                  Patient Name:  Sabrina Burke  MRN: [id number]  DOB: [birth year]  Age / Sex: 86 y.o., female         Reason for Consult:  Hypernatremia            History of Present Illness: Patient is a 86 y.o. female with medical problems of dementia, hypertension, chronic kidney disease who was admitted to [facility] on 02/20/2019 for evaluation of left hip pain after mechanical fall at [facility].   Nephrology consult was requested for hyponatremia.  She was started on IV D5W which has been increased to 100 cc/h today.  Per nursing report, she is able to drink some thickened liquids Patient did not interact or answer any questions this morning   Vital Signs: Blood pressure (!) 178/66, pulse 63, temperature 98 F (36.7 C), temperature source Oral, resp. rate 16, height 5\' 2"  (1.575 m), weight 45.4 kg, SpO2 99 %.   Intake/Output Summary (Last 24 hours) at 03/02/2019 1127 Last data filed at 03/02/2019 0900 Gross per 24 hour  Intake 600 ml  Output 605 ml  Net -5 ml    Weight trends: [employer]   02/20/19 1951  Weight: 45.4 kg    Physical Exam: General:  Thin, frail, elderly female, laying in the bed  HEENT  moist oral mucous membranes  Neck:  No masses  Lungs:  Shallow breathing effort, clear to auscultation  Heart::  No rub  Abdomen:  Soft, nontender  Extremities:  No peripheral edema  Neurologic:  Arousable but did not answer questions  Skin:  Decreased turgor    Lab results: Basic Metabolic Panel: Recent Labs  Lab 02/27/19 0716 02/27/19 0716 02/28/19 0128 03/01/19 0407 03/02/19 0258  NA 151*   < > 149* 153* 154*  K 3.9  --  3.9  --  4.2  CL 118*  --  116*  --  120*  CO2 25  --  26  --  29  GLUCOSE 111*  --  110*  --  145*  BUN 34*  --  36*  --  37*  CREATININE 2.17*  --  2.08*  --  1.88*  CALCIUM 8.4*  --  8.6*  --   8.8*   < > = values in this interval not displayed.    Liver Function Tests: Recent Labs  Lab 03/02/19 0258  AST 15  ALT 8  ALKPHOS 46  BILITOT 0.3  PROT 6.4*  ALBUMIN 2.2*   No results for input(s): LIPASE, AMYLASE in the last 168 hours. No results for input(s): AMMONIA in the last 168 hours.  CBC: Recent Labs  Lab 02/24/19 0459 02/24/19 0459 02/27/19 0716 02/28/19 0911  WBC 7.8  --   --  7.8  HGB 8.1*   < > 8.3* 8.7*  HCT 26.3*   < > 27.7* 28.7*  MCV 105.2*  --   --  105.9*  PLT 243  --   --  367   < > = values in this interval not displayed.    Cardiac Enzymes: No results for input(s): CKTOTAL, TROPONINI in the last 168 hours.  BNP: Invalid input(s): POCBNP  CBG: No results for input(s): GLUCAP in the last 168 hours.  Microbiology: Recent Results (from the past 720 hour(s))  Respiratory Panel by RT PCR (Flu  A&B, Covid) - Nasopharyngeal Swab     Status: None   Collection Time: 02/20/19  8:36 PM   Specimen: Nasopharyngeal Swab  Result Value Ref Range Status   SARS Coronavirus 2 by RT PCR NEGATIVE NEGATIVE Final    Comment: (NOTE) SARS-CoV-2 target nucleic acids are NOT DETECTED. The SARS-CoV-2 RNA is generally detectable in upper respiratoy specimens during the acute phase of infection. The lowest concentration of SARS-CoV-2 viral copies this assay can detect is 131 copies/mL. A negative result does not preclude SARS-Cov-2 infection and should not be used as the sole basis for treatment or other patient management decisions. A negative result may occur with  improper specimen collection/handling, submission of specimen other than nasopharyngeal swab, presence of viral mutation(s) within the areas targeted by this assay, and inadequate number of viral copies (<131 copies/mL). A negative result must be combined with clinical observations, patient history, and epidemiological information. The expected result is Negative. Fact Sheet for Patients:   https://www.moore.com/ Fact Sheet for Healthcare Providers:  https://www.young.biz/ This test is not yet ap proved or cleared by the [place] FDA and  has been authorized for detection and/or diagnosis of SARS-CoV-2 by FDA under an Emergency Use Authorization (EUA). This EUA will remain  in effect (meaning this test can be used) for the duration of the COVID-19 declaration under Section 564(b)(1) of the Act, 21 U.S.C. section 360bbb-3(b)(1), unless the authorization is terminated or revoked sooner.    Influenza A by PCR NEGATIVE NEGATIVE Final   Influenza B by PCR NEGATIVE NEGATIVE Final    Comment: (NOTE) The Xpert Xpress SARS-CoV-2/FLU/RSV assay is intended as an aid in  the diagnosis of influenza from Nasopharyngeal swab specimens and  should not be used as a sole basis for treatment. Nasal washings and  aspirates are unacceptable for Xpert Xpress SARS-CoV-2/FLU/RSV  testing. Fact Sheet for Patients: https://www.moore.com/ Fact Sheet for Healthcare Providers: https://www.young.biz/ This test is not yet approved or cleared by the [place] FDA and  has been authorized for detection and/or diagnosis of SARS-CoV-2 by  FDA under an Emergency Use Authorization (EUA). This EUA will remain  in effect (meaning this test can be used) for the duration of the  Covid-19 declaration under Section 564(b)(1) of the Act, 21  U.S.C. section 360bbb-3(b)(1), unless the authorization is  terminated or revoked. Performed at [facility], [address] [place], [place] [address]   Blood culture (routine x 2)     Status: None   Collection Time: 02/20/19  8:53 PM   Specimen: BLOOD  Result Value Ref Range Status   Specimen Description BLOOD RIGHT ANTECUBITAL  Final   Special Requests   Final    BOTTLES DRAWN AEROBIC AND ANAEROBIC Blood Culture results may not be optimal due to an inadequate volume of  blood received in culture bottles   Culture   Final    NO GROWTH 5 DAYS Performed at [facility], [address] [place], [place] [address]    Report Status 02/25/2019 FINAL  Final  Blood culture (routine x 2)     Status: None   Collection Time: 02/20/19  8:53 PM   Specimen: BLOOD  Result Value Ref Range Status   Specimen Description BLOOD BLOOD LEFT HAND  Final   Special Requests   Final    BOTTLES DRAWN AEROBIC AND ANAEROBIC Blood Culture adequate volume   Culture   Final    NO GROWTH 5 DAYS Performed at [facility], [address] [place], [place] [address]    Report Status 02/25/2019 FINAL  Final  Urine Culture     Status: Abnormal   Collection Time: 02/20/19 11:44 PM   Specimen: Urine, Clean Catch  Result Value Ref Range Status   Specimen Description   Final    URINE, CLEAN CATCH Performed at [facility], [address] [place], [place] [address]    Special Requests   Final    NONE Performed at [facility], [address] [place], [place] [address]    Culture >=100,000 COLONIES/mL ESCHERICHIA COLI (A)  Final   Report Status 02/22/2019 FINAL  Final   Organism ID, Bacteria ESCHERICHIA COLI (A)  Final      Susceptibility   Escherichia coli - MIC*    AMPICILLIN 8 SENSITIVE Sensitive     CEFAZOLIN <=4 SENSITIVE Sensitive     CEFTRIAXONE <=0.25 SENSITIVE Sensitive     CIPROFLOXACIN <=0.25 SENSITIVE Sensitive     GENTAMICIN <=1 SENSITIVE Sensitive     IMIPENEM <=0.25 SENSITIVE Sensitive     NITROFURANTOIN <=16 SENSITIVE Sensitive     TRIMETH/SULFA <=20 SENSITIVE Sensitive     AMPICILLIN/SULBACTAM <=2 SENSITIVE Sensitive     PIP/TAZO <=4 SENSITIVE Sensitive     * >=100,000 COLONIES/mL ESCHERICHIA COLI  Surgical pcr screen     Status: None   Collection Time: 02/21/19 12:50 AM   Specimen: Nasal Mucosa; Nasal Swab  Result Value Ref Range Status   MRSA, PCR NEGATIVE NEGATIVE Final   Staphylococcus aureus NEGATIVE NEGATIVE  Final    Comment: (NOTE) The Xpert SA Assay (FDA approved for NASAL specimens in patients 71 years of age and older), is one component of a comprehensive surveillance program. It is not intended to diagnose infection nor to guide or monitor treatment. Performed at [facility], [address] [place], [place] [address]   CULTURE, BLOOD (ROUTINE X 2) w Reflex to ID Panel     Status: None   Collection Time: 02/22/19  1:02 PM   Specimen: BLOOD  Result Value Ref Range Status   Specimen Description BLOOD BLOOD RIGHT HAND  Final   Special Requests   Final    BOTTLES DRAWN AEROBIC AND ANAEROBIC Blood Culture results may not be optimal due to an inadequate volume of blood received in culture bottles   Culture   Final    NO GROWTH 5 DAYS Performed at [facility], [address] [place], [place] [address]    Report Status 02/27/2019 FINAL  Final  CULTURE, BLOOD (ROUTINE X 2) w Reflex to ID Panel     Status: None   Collection Time: 02/22/19  1:08 PM   Specimen: BLOOD  Result Value Ref Range Status   Specimen Description BLOOD RIGHT ANTECUBITAL  Final   Special Requests   Final    BOTTLES DRAWN AEROBIC AND ANAEROBIC Blood Culture adequate volume   Culture   Final    NO GROWTH 5 DAYS Performed at [facility], [address] [place], [place] [address]    Report Status 02/27/2019 FINAL  Final  CULTURE, BLOOD (ROUTINE X 2) w Reflex to ID Panel     Status: None (Preliminary result)   Collection Time: 02/28/19  9:11 AM   Specimen: BLOOD  Result Value Ref Range Status   Specimen Description BLOOD RIGHT ANTECUBITAL  Final   Special Requests   Final    BOTTLES DRAWN AEROBIC AND ANAEROBIC Blood Culture results may not be optimal due to an  excessive volume of blood received in culture bottles   Culture   Final    NO GROWTH 2 DAYS Performed at [facility], [address] [place], [place] [address]    Report Status PENDING  Incomplete  CULTURE, BLOOD  (ROUTINE X 2) w Reflex to ID Panel     Status: None (Preliminary result)   Collection Time: 02/28/19  9:20 AM   Specimen: BLOOD  Result Value Ref Range Status   Specimen Description BLOOD LEFT ANTECUBITAL  Final   Special Requests   Final    BOTTLES DRAWN AEROBIC AND ANAEROBIC Blood Culture results may not be optimal due to an excessive volume of blood received in culture bottles   Culture   Final    NO GROWTH 2 DAYS Performed at [facility], [address], [place], [place] [address]    Report Status PENDING  Incomplete  Urine Culture     Status: Abnormal   Collection Time: 02/28/19 10:33 AM   Specimen: Urine, Clean Catch  Result Value Ref Range Status   Specimen Description   Final    URINE, CLEAN CATCH Performed at [facility], [address], [place], [place] [address]    Special Requests   Final    NONE Performed at [facility], [address] [place], [place] [address]    Culture 40,000 COLONIES/mL YEAST (A)  Final   Report Status 03/01/2019 FINAL  Final     Coagulation Studies: No results for input(s): LABPROT, INR in the last 72 hours.  Urinalysis: Recent Labs    02/28/19 1034  COLORURINE YELLOW*  LABSPEC 1.012  PHURINE 5.0  GLUCOSEU NEGATIVE  HGBUR NEGATIVE  BILIRUBINUR NEGATIVE  KETONESUR NEGATIVE  PROTEINUR 30*  NITRITE NEGATIVE  LEUKOCYTESUR SMALL*     . amLODipine  5 mg Oral Daily  . Chlorhexidine Gluconate Cloth  6 each Topical Daily  . cholecalciferol  2,000 Units Oral Daily  . famotidine  20 mg Oral QHS  . ferrous sulfate  325 mg Oral TID PC  . heparin injection (subcutaneous)  5,000 Units Subcutaneous Q8H  . levothyroxine  88 mcg Oral Q0600  . LORazepam  0.25 mg Oral BID  . metoprolol tartrate  25 mg Oral BID  . multivitamin with minerals   Oral Daily  . potassium chloride  10 mEq Oral BID  . senna  1 tablet Oral BID  . traMADol  50 mg Oral Q6H     Imaging: No results found.   Assessment & Plan: Pt  is a 86 y.o. Caucasian   female with hypertension and dementia, was admitted on 02/20/2019 with left hip fracture status post surgical repair/hip arthroplasty February 2.  Hospital course complicated by hypernatremia, UTI and acute kidney injury on chronic kidney disease  #Hypernatremia Likely secondary to Poor oral intake Na level is still high Patient is currently on nectar thick liquid diet and is drinking fluids per nursing For now, continue IV D5W until adequate oral fluid intake can be established Repeat Na this afternoon  #Chronic kidney disease stage IIIb Baseline creatinine of 1.31/GFR 36 on October 24, 2018 Underlying CKD is likely secondary to atherosclerosis -Acute kidney injury noted at time of admission.  Creatinine of 2.01 which improved to 1.6 on February 2.  Since then creatinine has been elevated and is stabilizing at 2.08-2.17 S Creatinine lower today with iv hydration Patient has completed treatment with antibiotics for urinary tract infection       LOS: 10  Teniola Tseng Thedore Mins 2/10/202111:27 AM    Note: This note was prepared with Dragon dictation. Any transcription errors are unintentional

## 2019-03-03 LAB — BASIC METABOLIC PANEL
Anion gap: 8 (ref 5–15)
BUN: 33 mg/dL — ABNORMAL HIGH (ref 8–23)
CO2: 28 mmol/L (ref 22–32)
Calcium: 8.7 mg/dL — ABNORMAL LOW (ref 8.9–10.3)
Chloride: 108 mmol/L (ref 98–111)
Creatinine, Ser: 1.8 mg/dL — ABNORMAL HIGH (ref 0.44–1.00)
GFR calc Af Amer: 28 mL/min — ABNORMAL LOW (ref >60)
GFR calc non Af Amer: 25 mL/min — ABNORMAL LOW (ref >60)
Glucose, Bld: 141 mg/dL — ABNORMAL HIGH (ref 70–99)
Potassium: 4.6 mmol/L (ref 3.5–5.1)
Sodium: 144 mmol/L (ref 135–145)

## 2019-03-03 LAB — MAGNESIUM: Magnesium: 2.2 mg/dL (ref 1.7–2.4)

## 2019-03-03 MED ORDER — FERROUS SULFATE 325 (65 FE) MG PO TABS: 325.0000 mg | ORAL_TABLET | Freq: Three times a day (TID) | ORAL | 3 refills | Status: AC

## 2019-03-03 MED ORDER — METOPROLOL TARTRATE 25 MG PO TABS: 25.0000 mg | ORAL_TABLET | Freq: Two times a day (BID) | ORAL | 0 refills | Status: AC

## 2019-03-03 MED ORDER — AMLODIPINE BESYLATE 5 MG PO TABS: 5.0000 mg | ORAL_TABLET | Freq: Every day | ORAL | 0 refills | Status: AC

## 2019-03-03 NOTE — Care Management Important Message (Signed)
Important Message  Patient Details  Name: Mireyah Chervenak MRN: [id number] Date of Birth: 04/04/1927   Medicare Important Message Given:  Yes  Reviewed Important Message from Medicare with HCPOA, Truett Perna and she is in agreement with today's discharge to [facility].    [name] A Jaycelynn Knickerbocker 03/03/2019, 10:28 AM

## 2019-03-03 NOTE — Discharge Summary (Addendum)
Physician Discharge Summary  Sabrina Burke GGY:[id number] DOB: 10/24/1927 DOA: 02/20/2019  PCP: Odessa Fleming, NP  Admit date: 02/20/2019 Discharge date: 03/03/2019  Admitted From: [facility] Disposition:  SNF - [facility]  Recommendations for Outpatient Follow-up:  1. Follow up with PCP in 1-2 weeks 2. Please obtain BMP/CBC in one week 3. Please follow up in orthopedic clinic in 2 weeks 4. Continue Lovenox shots daily for 2 weeks for VTE prophylaxis 5. Weight-bearing as tolerated on left lower extremity 6. PALLIATIVE CARE to continue following patient at [facility]: No Equipment/Devices: None  Discharge Condition: Stable  CODE STATUS: DNR   Diet recommendation: Dysphagia level 1(puree w/ gravies) w/ NECTAR consistency liquids d/t increased risk for aspiration currently; aspiration precautions; Pills Crushed in Puree   Brief/Interim Summary:  [name] a89 y.o.pleasantly demented Caucasian femalewith a known history of dementia and hypertension, who presented to the emergency room with acute onset ofleft hip pain after having a mechanical fall at [facility].  She underwent left hip hemiarthroplasty on 02/21/19.  Post-op physical therapy evaluation, SNF for rehab recommended.  Patient's hospital course complicated by hypernatremia likely secondary to poor oral intake and subsequent free water deficit.  This resolved with d5w infusion and increased oral intake.  Patient also had post-op constipation which resolved on 2/10 after suppository and enema were given, patient had large BM.  Patient is clinically improved and stable for discharge to rehab today.    Discharge Diagnoses: Active Problems:   Closed left hip fracture (HCC)   Fever   Protein-calorie malnutrition, severe   Essential hypertension   Acquired hypothyroidism    Discharge Instructions   ADDENDUM TO MED LIST: LOVENOX 40 mg [place] q24h x 14 days  Discharge  Instructions    Call MD for:  redness, tenderness, or signs of infection (pain, swelling, redness, odor or green/yellow discharge around incision site)   Complete by: As directed    Call MD for:  severe uncontrolled pain   Complete by: As directed    Call MD for:  temperature >100.4   Complete by: As directed    Diet - low sodium heart healthy   Complete by: As directed    Increase activity slowly   Complete by: As directed      Allergies as of 03/03/2019   No Known Allergies     Medication List    STOP taking these medications   ciprofloxacin 500 MG tablet Commonly known as: CIPRO   furosemide 20 MG tablet Commonly known as: LASIX   guaiFENesin 100 MG/5ML liquid Commonly known as: ROBITUSSIN   potassium chloride 20 MEQ packet Commonly known as: KLOR-CON     TAKE these medications   acetaminophen 325 MG tablet Commonly known as: TYLENOL Take 325 mg by mouth every 6 (six) hours as needed for fever.   alum & mag hydroxide-simeth 200-200-20 MG/5ML suspension Commonly known as: MAALOX/MYLANTA Take 30 mLs by mouth as needed for indigestion or heartburn (max 4 doses in 24 hours).   amLODipine 5 MG tablet Commonly known as: NORVASC Take 1 tablet (5 mg total) by mouth daily. Start taking on: March 04, 2019 What changed:   medication strength  how much to take   aspirin 81 MG EC tablet Take 1 tablet (81 mg total) by mouth daily.   bisacodyl 5 MG EC tablet Commonly known as: DULCOLAX Take 5 mg by mouth every other day as needed for moderate constipation.   DAILY VITE PO Take  1 tablet by mouth daily.   Dermacloud Crea Apply 1 application topically 3 (three) times daily. Apply to the buttocks and sacrum after each diaper change. Every shift: 0700-1500, 1500-2300, 2300-0700   docusate sodium 100 MG capsule Commonly known as: COLACE Take 100 mg by mouth at bedtime.   feeding supplement (ENSURE ENLIVE) Liqd Take 237 mLs by mouth 2 (two) times daily between  meals.   ferrous sulfate 325 (65 FE) MG tablet Take 1 tablet (325 mg total) by mouth 3 (three) times daily after meals.   levothyroxine 88 MCG tablet Commonly known as: SYNTHROID Take 88 mcg by mouth daily before breakfast.   loperamide 2 MG capsule Commonly known as: IMODIUM Take 2 mg by mouth as needed for diarrhea or loose stools (max 8 doses in 24 hours).   LORazepam 0.5 MG tablet Commonly known as: ATIVAN Take 0.25 mg by mouth 2 (two) times daily as needed for anxiety.   LORazepam 0.5 MG tablet Commonly known as: ATIVAN Take 0.5 tablets (0.25 mg total) by mouth 2 (two) times daily. Takes at 1400 and 2100   magnesium hydroxide 400 MG/5ML suspension Commonly known as: MILK OF MAGNESIA Take 30 mLs by mouth at bedtime as needed for mild constipation.   metoprolol tartrate 25 MG tablet Commonly known as: LOPRESSOR Take 1 tablet (25 mg total) by mouth 2 (two) times daily.   neomycin-bacitracin-polymyxin ointment Commonly known as: NEOSPORIN Apply 1 application topically as needed for wound care.   polyethylene glycol 17 g packet Commonly known as: MIRALAX / GLYCOLAX Take 17 g by mouth daily as needed for mild constipation.   Vitamin D3 50 MCG (2000 UT) Tabs Take 2,000 Units by mouth daily.            Durable Medical Equipment  (From admission, onward)         Start     Ordered   02/24/19 1553  For home use only DME Hospital bed  Once    Question Answer Comment  Length of Need Lifetime   Head must be elevated greater than: 30 degrees   Bed type Semi-electric   Hoyer Lift Yes   Support Surface: Alternating Pressure Pad and Pump      02/24/19 1552          Contact information for follow-up providers    Juanell Fairly, MD. Schedule an appointment as soon as possible for a visit in 2 week(s).   Specialty: Orthopedic Surgery Why: Please call and make appointment before the patient is discharged. Contact information: [address] [place]  [address] [phone]            Contact information for after-discharge care    Destination    [facility] Preferred SNF .   Service: Skilled Nursing Contact information: [address] [place] [address] [phone]                 No Known Allergies  Consultations:  Orthopedics   Procedures/Studies: DG Chest 1 View  Result Date: 02/20/2019 CLINICAL DATA:  Initial evaluation for acute trauma, fall. EXAM: CHEST  1 VIEW COMPARISON:  Prior radiograph from 11/01/2018. FINDINGS: Mild cardiomegaly, stable. Mediastinal silhouette within normal limits. Aortic atherosclerosis. Chronic elevation of the right hemidiaphragm, stable from previous. Associated mild linear right basilar subsegmental atelectasis. No other focal airspace disease. No edema or effusion. No visible pneumothorax, although the patient's head partially obscures the right lung apex. No acute osseous abnormality. Diffuse osteopenia. Mild  gaseous distension of the transverse colon noted within the upper abdomen. IMPRESSION: 1. No active cardiopulmonary disease. 2. Chronic elevation of the right hemidiaphragm with associated minimal right basilar subsegmental atelectasis. 3.  Aortic Atherosclerosis (ICD10-I70.0). Electronically Signed   By: Rise Mu M.D.   On: 02/20/2019 20:26   CT Head Wo Contrast  Result Date: 02/20/2019 CLINICAL DATA:  Recent fall 2 days ago with pain, initial encounter EXAM: CT HEAD WITHOUT CONTRAST CT CERVICAL SPINE WITHOUT CONTRAST TECHNIQUE: Multidetector CT imaging of the head and cervical spine was performed following the standard protocol without intravenous contrast. Multiplanar CT image reconstructions of the cervical spine were also generated. COMPARISON:  12/20/2018 FINDINGS: CT HEAD FINDINGS Brain: Chronic atrophic and ischemic changes are again seen. No findings to suggest acute hemorrhage, acute infarction or space-occupying mass lesion are noted.  Vascular: No hyperdense vessel or unexpected calcification. Skull: Normal. Negative for fracture or focal lesion. Sinuses/Orbits: No acute finding. Other: None. CT CERVICAL SPINE FINDINGS Alignment: Within normal limits. Skull base and vertebrae: 7 cervical segments are well visualized. Vertebral body height is well maintained. Multilevel facet hypertrophic changes are noted. No acute fracture or acute facet abnormality is noted. Mild osteophytic changes are seen. Disc space narrowing is also seen primarily at C5-6 and C6-7. Soft tissues and spinal canal: Surrounding soft tissue structures are within normal limits. Carotid calcifications are seen. Upper chest: Visualized lung apices are within normal limits. Other: None IMPRESSION: CT of the head: Chronic atrophic and ischemic changes without acute abnormality. CT of cervical spine: Multilevel degenerative change without acute abnormality. Electronically Signed   By: Alcide Clever M.D.   On: 02/20/2019 20:37   CT Cervical Spine Wo Contrast  Result Date: 02/20/2019 CLINICAL DATA:  Recent fall 2 days ago with pain, initial encounter EXAM: CT HEAD WITHOUT CONTRAST CT CERVICAL SPINE WITHOUT CONTRAST TECHNIQUE: Multidetector CT imaging of the head and cervical spine was performed following the standard protocol without intravenous contrast. Multiplanar CT image reconstructions of the cervical spine were also generated. COMPARISON:  12/20/2018 FINDINGS: CT HEAD FINDINGS Brain: Chronic atrophic and ischemic changes are again seen. No findings to suggest acute hemorrhage, acute infarction or space-occupying mass lesion are noted. Vascular: No hyperdense vessel or unexpected calcification. Skull: Normal. Negative for fracture or focal lesion. Sinuses/Orbits: No acute finding. Other: None. CT CERVICAL SPINE FINDINGS Alignment: Within normal limits. Skull base and vertebrae: 7 cervical segments are well visualized. Vertebral body height is well maintained. Multilevel facet  hypertrophic changes are noted. No acute fracture or acute facet abnormality is noted. Mild osteophytic changes are seen. Disc space narrowing is also seen primarily at C5-6 and C6-7. Soft tissues and spinal canal: Surrounding soft tissue structures are within normal limits. Carotid calcifications are seen. Upper chest: Visualized lung apices are within normal limits. Other: None IMPRESSION: CT of the head: Chronic atrophic and ischemic changes without acute abnormality. CT of cervical spine: Multilevel degenerative change without acute abnormality. Electronically Signed   By: Alcide Clever M.D.   On: 02/20/2019 20:37   DG Chest Port 1 View  Result Date: 02/28/2019 CLINICAL DATA:  Fever. EXAM: PORTABLE CHEST 1 VIEW COMPARISON:  Chest x-ray dated February 22, 2019. FINDINGS: The heart size and mediastinal contours are within normal limits. Atherosclerotic calcification of the aortic arch. Normal pulmonary vascularity. No focal consolidation, pleural effusion, or pneumothorax. Unchanged elevation of the right hemidiaphragm. No acute osseous abnormality. IMPRESSION: No active disease. Electronically Signed   By: Obie Dredge M.D.   On:  02/28/2019 09:16   DG CHEST PORT 1 VIEW  Result Date: 02/22/2019 CLINICAL DATA:  Fever. EXAM: PORTABLE CHEST 1 VIEW COMPARISON:  02/20/2019. FINDINGS: Mediastinum and hilar structures normal. Mild bibasilar atelectasis. No focal alveolar infiltrate. No pleural effusion or pneumothorax. Stable elevation right hemidiaphragm. Degenerative change thoracic spine. Degenerative changes both shoulders. IMPRESSION: Mild bibasilar atelectasis. Stable elevation right hemidiaphragm. Chest is stable from prior exam. Electronically Signed   By: Maisie Fushomas  Register   On: 02/22/2019 13:59   DG Hip Port Unilat With Pelvis 1V Left  Result Date: 02/21/2019 CLINICAL DATA:  Left hip hemi arthroplasty for fracture EXAM: DG HIP (WITH OR WITHOUT PELVIS) 1V PORT LEFT COMPARISON:  02/19/2018 FINDINGS: Left  hip hemiarthroplasty in satisfactory position alignment. Chronic right fracture fixation with screw and rod. No new fracture in the pelvis IMPRESSION: Satisfactory left hip hemiarthroplasty for fracture. Electronically Signed   By: Marlan Palauharles  Clark M.D.   On: 02/21/2019 15:45   DG Hip Unilat W or Wo Pelvis 2-3 Views Left  Result Date: 02/20/2019 CLINICAL DATA:  Recent fall with hip pain, initial encounter EXAM: DG HIP (WITH OR WITHOUT PELVIS) 3V LEFT COMPARISON:  None. FINDINGS: Pelvic ring is intact. There is a subcapital femoral neck fracture with impaction and angulation at the fracture site. Postsurgical changes in the proximal right femur are noted. Degenerative changes of lumbar spine are seen. IMPRESSION: Left subcapital femoral neck fracture Electronically Signed   By: Alcide CleverMark  Lukens M.D.   On: 02/20/2019 20:24      02/21/19 - left hip hemiarthroplasty    Subjective: Patient seen while eating breakfast this AM.  No acute events reported overnight.  Patient denies pain or other acute complaints.     Discharge Exam: Vitals:   03/02/19 2342 03/03/19 0828  BP: (!) 113/43 (!) 141/64  Pulse: (!) 53 60  Resp: 16 16  Temp: (!) 97.5 F (36.4 C) 97.6 F (36.4 C)  SpO2: 95% 96%   Vitals:   03/02/19 0741 03/02/19 1534 03/02/19 2342 03/03/19 0828  BP: (!) 178/66 (!) 131/42 (!) 113/43 (!) 141/64  Pulse: 63 66 (!) 53 60  Resp: 16 18 16 16   Temp: 98 F (36.7 C) (!) 97.5 F (36.4 C) (!) 97.5 F (36.4 C) 97.6 F (36.4 C)  TempSrc: Oral Axillary    SpO2: 99% 95% 95% 96%  Weight:      Height:        General: Pt is alert, awake, not in acute distress Cardiovascular: RRR, S1/S2 +, no rubs, no gallops Respiratory: CTA bilaterally, no wheezing, no rhonchi Abdominal: Soft, NT, ND, bowel sounds + Extremities: no edema, no cyanosis, SCD's on lower extremities    The results of significant diagnostics from this hospitalization (including imaging, microbiology, ancillary and laboratory) are  listed below for reference.     Microbiology: Recent Results (from the past 240 hour(s))  CULTURE, BLOOD (ROUTINE X 2) w Reflex to ID Panel     Status: None   Collection Time: 02/22/19  1:02 PM   Specimen: BLOOD  Result Value Ref Range Status   Specimen Description BLOOD BLOOD RIGHT HAND  Final   Special Requests   Final    BOTTLES DRAWN AEROBIC AND ANAEROBIC Blood Culture results may not be optimal due to an inadequate volume of blood received in culture bottles   Culture   Final    NO GROWTH 5 DAYS Performed at [facility], [address] [place], [place] [address]    Report Status  02/27/2019 FINAL  Final  CULTURE, BLOOD (ROUTINE X 2) w Reflex to ID Panel     Status: None   Collection Time: 02/22/19  1:08 PM   Specimen: BLOOD  Result Value Ref Range Status   Specimen Description BLOOD RIGHT ANTECUBITAL  Final   Special Requests   Final    BOTTLES DRAWN AEROBIC AND ANAEROBIC Blood Culture adequate volume   Culture   Final    NO GROWTH 5 DAYS Performed at [facility] [address] [place], [place] [address]    Report Status 02/27/2019 FINAL  Final  CULTURE, BLOOD (ROUTINE X 2) w Reflex to ID Panel     Status: None (Preliminary result)   Collection Time: 02/28/19  9:11 AM   Specimen: BLOOD  Result Value Ref Range Status   Specimen Description BLOOD RIGHT ANTECUBITAL  Final   Special Requests   Final    BOTTLES DRAWN AEROBIC AND ANAEROBIC Blood Culture results may not be optimal due to an excessive volume of blood received in culture bottles   Culture   Final    NO GROWTH 3 DAYS Performed at [facility], [address], [place], [place] [address]    Report Status PENDING  Incomplete  CULTURE, BLOOD (ROUTINE X 2) w Reflex to ID Panel     Status: None (Preliminary result)   Collection Time: 02/28/19  9:20 AM   Specimen: BLOOD  Result Value Ref Range Status   Specimen Description BLOOD LEFT ANTECUBITAL  Final   Special Requests   Final     BOTTLES DRAWN AEROBIC AND ANAEROBIC Blood Culture results may not be optimal due to an excessive volume of blood received in culture bottles   Culture   Final    NO GROWTH 3 DAYS Performed at [facility], [address], [place], [place] [address]    Report Status PENDING  Incomplete  Urine Culture     Status: Abnormal   Collection Time: 02/28/19 10:33 AM   Specimen: Urine, Clean Catch  Result Value Ref Range Status   Specimen Description   Final    URINE, CLEAN CATCH Performed at [facility], [address] [place], [place] [address]    Special Requests   Final    NONE Performed at [facility], [address] [place], [place] [address]    Culture 40,000 COLONIES/mL YEAST (A)  Final   Report Status 03/01/2019 FINAL  Final  SARS CORONAVIRUS 2 (TAT 6-24 HRS) Nasopharyngeal Nasopharyngeal Swab     Status: None   Collection Time: 03/02/19 10:49 AM   Specimen: Nasopharyngeal Swab  Result Value Ref Range Status   SARS Coronavirus 2 NEGATIVE NEGATIVE Final    Comment: (NOTE) SARS-CoV-2 target nucleic acids are NOT DETECTED. The SARS-CoV-2 RNA is generally detectable in upper and lower respiratory specimens during the acute phase of infection. Negative results do not preclude SARS-CoV-2 infection, do not rule out co-infections with other pathogens, and should not be used as the sole basis for treatment or other patient management decisions. Negative results must be combined with clinical observations, patient history, and epidemiological information. The expected result is Negative. Fact Sheet for Patients: HairSlick.no Fact Sheet for Healthcare Providers: quierodirigir.com This test is not yet approved or cleared by the [place] FDA and  has been authorized for detection and/or diagnosis of SARS-CoV-2 by FDA under an Emergency Use Authorization (EUA). This EUA will remain  in effect  (meaning this test can be used) for  the duration of the COVID-19 declaration under Section 56 4(b)(1) of the Act, 21 U.S.C. section 360bbb-3(b)(1), unless the authorization is terminated or revoked sooner. Performed at [facility], [address] [place], [place] [address]      Labs: BNP (last 3 results) No results for input(s): BNP in the last 8760 hours. Basic Metabolic Panel: Recent Labs  Lab 02/27/19 0716 02/27/19 0716 02/28/19 0128 03/01/19 0407 03/02/19 0258 03/02/19 1358 03/03/19 0412  NA 151*   < > 149* 153* 154* 149* 144  K 3.9  --  3.9  --  4.2 4.3 4.6  CL 118*  --  116*  --  120* 115* 108  CO2 25  --  26  --  29 27 28   GLUCOSE 111*  --  110*  --  145* 145* 141*  BUN 34*  --  36*  --  37* 34* 33*  CREATININE 2.17*  --  2.08*  --  1.88* 1.99* 1.80*  CALCIUM 8.4*  --  8.6*  --  8.8* 8.6* 8.7*  MG  --   --   --   --   --   --  2.2   < > = values in this interval not displayed.   Liver Function Tests: Recent Labs  Lab 03/02/19 0258  AST 15  ALT 8  ALKPHOS 46  BILITOT 0.3  PROT 6.4*  ALBUMIN 2.2*   No results for input(s): LIPASE, AMYLASE in the last 168 hours. No results for input(s): AMMONIA in the last 168 hours. CBC: Recent Labs  Lab 02/27/19 0716 02/28/19 0911  WBC  --  7.8  HGB 8.3* 8.7*  HCT 27.7* 28.7*  MCV  --  105.9*  PLT  --  367   Cardiac Enzymes: No results for input(s): CKTOTAL, CKMB, CKMBINDEX, TROPONINI in the last 168 hours. BNP: Invalid input(s): POCBNP CBG: No results for input(s): GLUCAP in the last 168 hours. D-Dimer No results for input(s): DDIMER in the last 72 hours. Hgb A1c No results for input(s): HGBA1C in the last 72 hours. Lipid Profile No results for input(s): CHOL, HDL, LDLCALC, TRIG, CHOLHDL, LDLDIRECT in the last 72 hours. Thyroid function studies No results for input(s): TSH, T4TOTAL, T3FREE, THYROIDAB in the last 72 hours.  Invalid input(s): FREET3 Anemia work up No results for input(s):  VITAMINB12, FOLATE, FERRITIN, TIBC, IRON, RETICCTPCT in the last 72 hours. Urinalysis    Component Value Date/Time   COLORURINE YELLOW (A) 02/28/2019 1034   APPEARANCEUR HAZY (A) 02/28/2019 1034   LABSPEC 1.012 02/28/2019 1034   PHURINE 5.0 02/28/2019 1034   GLUCOSEU NEGATIVE 02/28/2019 1034   HGBUR NEGATIVE 02/28/2019 1034   BILIRUBINUR NEGATIVE 02/28/2019 1034   KETONESUR NEGATIVE 02/28/2019 1034   PROTEINUR 30 (A) 02/28/2019 1034   NITRITE NEGATIVE 02/28/2019 1034   LEUKOCYTESUR SMALL (A) 02/28/2019 1034   Sepsis Labs Invalid input(s): PROCALCITONIN,  WBC,  LACTICIDVEN Microbiology Recent Results (from the past 240 hour(s))  CULTURE, BLOOD (ROUTINE X 2) w Reflex to ID Panel     Status: None   Collection Time: 02/22/19  1:02 PM   Specimen: BLOOD  Result Value Ref Range Status   Specimen Description BLOOD BLOOD RIGHT HAND  Final   Special Requests   Final    BOTTLES DRAWN AEROBIC AND ANAEROBIC Blood Culture results may not be optimal due to an inadequate volume of blood received in culture bottles   Culture   Final    NO GROWTH 5 DAYS Performed  at [facility], [address] [place], [place] [address]    Report Status 02/27/2019 FINAL  Final  CULTURE, BLOOD (ROUTINE X 2) w Reflex to ID Panel     Status: None   Collection Time: 02/22/19  1:08 PM   Specimen: BLOOD  Result Value Ref Range Status   Specimen Description BLOOD RIGHT ANTECUBITAL  Final   Special Requests   Final    BOTTLES DRAWN AEROBIC AND ANAEROBIC Blood Culture adequate volume   Culture   Final    NO GROWTH 5 DAYS Performed at [facility], [address] [place], [place] [address]    Report Status 02/27/2019 FINAL  Final  CULTURE, BLOOD (ROUTINE X 2) w Reflex to ID Panel     Status: None (Preliminary result)   Collection Time: 02/28/19  9:11 AM   Specimen: BLOOD  Result Value Ref Range Status   Specimen Description BLOOD RIGHT ANTECUBITAL  Final   Special Requests   Final     BOTTLES DRAWN AEROBIC AND ANAEROBIC Blood Culture results may not be optimal due to an excessive volume of blood received in culture bottles   Culture   Final    NO GROWTH 3 DAYS Performed at [facility], [address], [place], [place] [address]    Report Status PENDING  Incomplete  CULTURE, BLOOD (ROUTINE X 2) w Reflex to ID Panel     Status: None (Preliminary result)   Collection Time: 02/28/19  9:20 AM   Specimen: BLOOD  Result Value Ref Range Status   Specimen Description BLOOD LEFT ANTECUBITAL  Final   Special Requests   Final    BOTTLES DRAWN AEROBIC AND ANAEROBIC Blood Culture results may not be optimal due to an excessive volume of blood received in culture bottles   Culture   Final    NO GROWTH 3 DAYS Performed at [facility], [address] [place], [place] [address]    Report Status PENDING  Incomplete  Urine Culture     Status: Abnormal   Collection Time: 02/28/19 10:33 AM   Specimen: Urine, Clean Catch  Result Value Ref Range Status   Specimen Description   Final    URINE, CLEAN CATCH Performed at [facility], [address] [place], [place] [address]    Special Requests   Final    NONE Performed at [facility], [address] [place], [place] [address]    Culture 40,000 COLONIES/mL YEAST (A)  Final   Report Status 03/01/2019 FINAL  Final  SARS CORONAVIRUS 2 (TAT 6-24 HRS) Nasopharyngeal Nasopharyngeal Swab     Status: None   Collection Time: 03/02/19 10:49 AM   Specimen: Nasopharyngeal Swab  Result Value Ref Range Status   SARS Coronavirus 2 NEGATIVE NEGATIVE Final    Comment: (NOTE) SARS-CoV-2 target nucleic acids are NOT DETECTED. The SARS-CoV-2 RNA is generally detectable in upper and lower respiratory specimens during the acute phase of infection. Negative results do not preclude SARS-CoV-2 infection, do not rule out co-infections with other pathogens, and should not be used as the sole basis for treatment  or other patient management decisions. Negative results must be combined with clinical observations, patient history, and epidemiological information. The expected result is Negative. Fact Sheet for Patients: HairSlick.no Fact Sheet for Healthcare Providers: quierodirigir.com This test is not yet approved or cleared by the [place] FDA and  has been authorized for detection and/or diagnosis of SARS-CoV-2 by FDA under an Emergency Use  Authorization (EUA). This EUA will remain  in effect (meaning this test can be used) for the duration of the COVID-19 declaration under Section 56 4(b)(1) of the Act, 21 U.S.C. section 360bbb-3(b)(1), unless the authorization is terminated or revoked sooner. Performed at [facility], [address] [address], [place], [place] [address]      Time coordinating discharge: Over 30 minutes  SIGNED:   Pennie Banter, DO [facility] 03/03/2019, 8:48 AM   If 7PM-7AM, please contact night-coverage [email or web address]

## 2019-03-03 NOTE — TOC Transition Note (Signed)
Transition of Care [facility]) - CM/SW Discharge Note   Patient Details  Name: Sabrina Burke MRN: [id number] Date of Birth: [birth year]  Transition of Care [facility]) CM/SW Contact:  Lucy Chris, LCSW Phone Number: 03/03/2019, 8:39 AM   Clinical Narrative:  MD reports pt is medically stable for transfer to [facility] today. Bedside RN aware going to room 606 and to call report to [phone]. Have contacted daughter to inform of the plan for today. She would like for Mom to be dressed when she goes. Clothes in room. Bedside RN aware.       Final next level of care: Skilled Nursing Facility Barriers to Discharge: Barriers Resolved   Patient Goals and CMS Choice Patient states their goals for this hospitalization and ongoing recovery are:: Plan to go to SNF then back to [facility] where she has been living with 08/2017      Discharge Placement PASRR number recieved: 02/22/19            Patient chooses bed at: [facility]  Patient to be transferred to facility by: EMS Name of family member notified: Lea-daughter Patient and family notified of of transfer: 03/03/19  Discharge Plan and Services In-house Referral: Clinical Social Work Discharge Planning Services: NA                                 Social Determinants of Health (SDOH) Interventions     Readmission Risk Interventions No flowsheet data found.

## 2019-03-03 NOTE — Progress Notes (Signed)
EMS at bedside to transport pt to [facility].  All belongings taken at discharge, IV removed, VS stable.  Pt in NAD.

## 2019-03-03 NOTE — Progress Notes (Signed)
Report called to Selena Batten RN at [facility].

## 2019-03-05 LAB — CULTURE, BLOOD (ROUTINE X 2)
Culture: NO GROWTH
Culture: NO GROWTH

## 2019-03-10 ENCOUNTER — Non-Acute Institutional Stay: Payer: Medicare Other | Admitting: Primary Care

## 2019-03-10 ENCOUNTER — Other Ambulatory Visit: Payer: Self-pay

## 2019-03-10 DIAGNOSIS — Z515 Encounter for palliative care: Secondary | ICD-10-CM

## 2019-03-10 NOTE — Progress Notes (Signed)
[facility] Palliative Care Consult Note Telephone: [phone]  Fax: [phone]  TELEHEALTH VISIT STATEMENT Due to the COVID-19 crisis, this visit was done via telemedicine from my office. It was initiated and consented to by this patient and/or family.  PATIENT NAME: Sabrina Burke [address] [place] [address] [phone] (home)  DOB: December 02, 1927 MRN: [id number]  PRIMARY CARE PROVIDER:   Juluis Pitch, MD, [address] [place] [address] [phone]  REFERRING PROVIDER:  Juluis Pitch, MD [address] [place],  [place] [address] [phone]  RESPONSIBLE PARTY:   Extended Emergency Contact Information Primary Emergency Contact: [place], [place] Home Phone: [phone] Mobile Phone: [phone] Relation: Daughter   ASSESSMENT AND RECOMMENDATIONS:   1. Advance Care Planning/Goals of Care: Goals include to maximize quality of life and symptom management. DNR, comfort measures, limited use of abx, no iv fluids, no feeding tube POA is daughter. I spoke with her re her mother's function. She is working with SNF therapists now. She had lost function in ALF [facility] due to covid isolation. She has also been in decline for awhile. Daughter states she's spoken with palliative NP at the hospital. We discussed their concern for paying for higher levels of care as patient declines but wishing for the best care for her mother.  2. Symptom Management:   Pain medication: PRN tylenol, does not appear to have pain. 0/10 on [facility]INAD. Staff states she cannot report pain. Daughter reports she had a broken leg and was not able to report pain while in ALF/MC  Combative: Has prn ativan for occ episodes.   Severe dementia: Likely cannot return to [facility] due to poor function; however she will be reassessed after therapy course. We discussed her plan for placement after therapy.  3. Family /Caregiver/Community Supports: Has daughter who is POA. She  was concerned RE isolation.   4. Cognitive / Functional decline: Can pivot transfer with help, can propel 30' In wheel chair. She needs help with ADLS and all IADLS  5. Follow up Palliative Care Visit: Palliative care will continue to follow for goals of care clarification and symptom management. Return 4 weeks or prn.  I spent 45 minutes providing this consultation,  from 1600 to 1645. More than 50% of the time in this consultation was spent coordinating communication.   HISTORY OF PRESENT ILLNESS:  Recia Sons is a 86 y.o. year old female with multiple medical problems including vascular dementia, hypothyroidism, HTN, PVD, protein calorie malnutrition. Palliative Care was asked to follow this patient by consultation request of Juluis Pitch, MD to help address advance care planning and goals of care. This is a follow up visit.  CODE STATUS: DNR, comfort measures, limited use of abx, no iv fluids, no feeding tube  PPS: 30% HOSPICE ELIGIBILITY/DIAGNOSIS: TBD  PAST MEDICAL HISTORY:  Past Medical History:  Diagnosis Date  . Dementia ([place])   . Hypertension     SOCIAL HX:  Social History   Tobacco Use  . Smoking status: Never Smoker  . Smokeless tobacco: Never Used  . Tobacco comment: pt with advance dementia  Substance Use Topics  . Alcohol use: Not Currently    ALLERGIES: No Known Allergies   PERTINENT MEDICATIONS:  Outpatient Encounter Medications as of 03/10/2019  Medication Sig  . acetaminophen (TYLENOL) 325 MG tablet Take 325 mg by mouth every 6 (six) hours as needed for fever.   Marland Kitchen alum & mag hydroxide-simeth (MAALOX/MYLANTA) 200-200-20 MG/5ML suspension Take 30 mLs by mouth  as needed for indigestion or heartburn (max 4 doses in 24 hours).   Marland Kitchen amLODipine (NORVASC) 5 MG tablet Take 1 tablet (5 mg total) by mouth daily.  Marland Kitchen aspirin EC 81 MG EC tablet Take 1 tablet (81 mg total) by mouth daily.  . bisacodyl (DULCOLAX) 5 MG EC tablet Take 5 mg by mouth every other day as  needed for moderate constipation.   . Cholecalciferol (VITAMIN D3) 50 MCG (2000 UT) TABS Take 2,000 Units by mouth daily.   Marland Kitchen docusate sodium (COLACE) 100 MG capsule Take 100 mg by mouth at bedtime.  . feeding supplement, ENSURE ENLIVE, (ENSURE ENLIVE) LIQD Take 237 mLs by mouth 2 (two) times daily between meals.  . ferrous sulfate 325 (65 FE) MG tablet Take 1 tablet (325 mg total) by mouth 3 (three) times daily after meals.  . Infant Care Products (DERMACLOUD) CREA Apply 1 application topically 3 (three) times daily. Apply to the buttocks and sacrum after each diaper change. Every shift: 0700-1500, 1500-2300, 2300-0700  . levothyroxine (SYNTHROID) 88 MCG tablet Take 88 mcg by mouth daily before breakfast.   . loperamide (IMODIUM) 2 MG capsule Take 2 mg by mouth as needed for diarrhea or loose stools (max 8 doses in 24 hours).   . LORazepam (ATIVAN) 0.5 MG tablet Take 0.25 mg by mouth 2 (two) times daily as needed for anxiety.  Marland Kitchen LORazepam (ATIVAN) 0.5 MG tablet Take 0.5 tablets (0.25 mg total) by mouth 2 (two) times daily. Takes at 1400 and 2100  . magnesium hydroxide (MILK OF MAGNESIA) 400 MG/5ML suspension Take 30 mLs by mouth at bedtime as needed for mild constipation.   . metoprolol tartrate (LOPRESSOR) 25 MG tablet Take 1 tablet (25 mg total) by mouth 2 (two) times daily.  . Multiple Vitamin (DAILY VITE PO) Take 1 tablet by mouth daily.  Marland Kitchen neomycin-bacitracin-polymyxin (NEOSPORIN) ointment Apply 1 application topically as needed for wound care.  . polyethylene glycol (MIRALAX / GLYCOLAX) 17 g packet Take 17 g by mouth daily as needed for mild constipation.   No facility-administered encounter medications on file as of 03/10/2019.    PHYSICAL EXAM / ROS:   Current and past weights: 122 lbs. Her normal is between 120-126 lbs.  General: NAD, frail appearing, thin Cardiovascular: no chest pain reported, no edema reported Pulmonary: no cough, no increased SOB, room air Abdomen: intake poor  most of the time, occ good appetite, denies constipation, incontinent of bowel GU: denies dysuria, incontinent of urine, CKD 4, U/a on 15th was <10 K colonies MSK:  no joint deformities, non ambulatory, frequent fall, slid from wheel chair and recent broken hip Skin: zinc to sacrum, surgical incision, due to have staples removed Neurological: Weakness, advanced dementia, FAST stage 7C  Eliezer Lofts, NP  [facility]

## 2019-04-21 DEATH — deceased

## 2019-08-31 IMAGING — CT CT CERVICAL SPINE W/O CM
4 of 8 series · 12 of 33 positions shown, 13 images · non-contrast
Comparison: None.

CLINICAL DATA: Status post unwitnessed fall from wheelchair.
Left-sided head laceration. Concern for cervical spine injury.
Initial encounter.

EXAM:
CT HEAD WITHOUT CONTRAST
CT CERVICAL SPINE WITHOUT CONTRAST
TECHNIQUE: Multidetector CT imaging of the head and cervical spine was
performed following the standard protocol without intravenous
contrast. Multiplanar CT image reconstructions of the cervical spine
were also generated.

[Series 10: c spine soft · axial · 0.32mm/px · z∈[+546,+580]mm · 2 of 51 slices shown]
[im 17/51  soft-tissue]
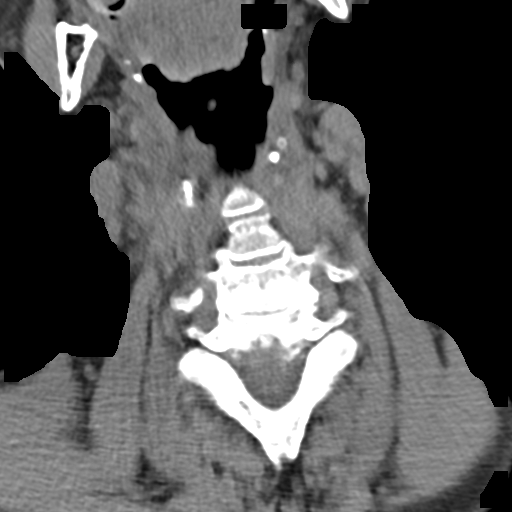
[im 34/51  soft-tissue]
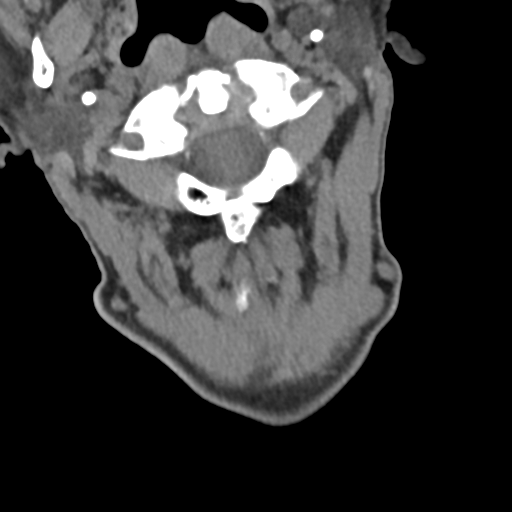

[Series 11: sagittal bone · sagittal · 0.22mm/px · 5 of 65 slices shown]
[im 11/65  bone]
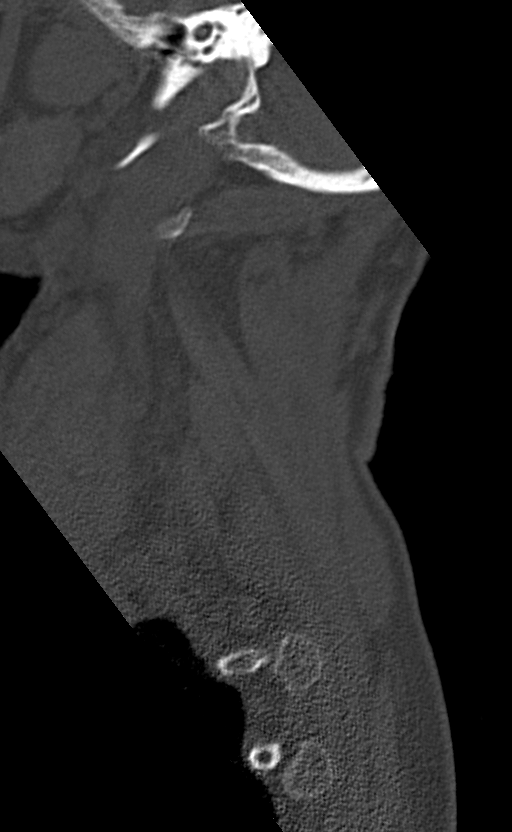
[im 22/65  bone]
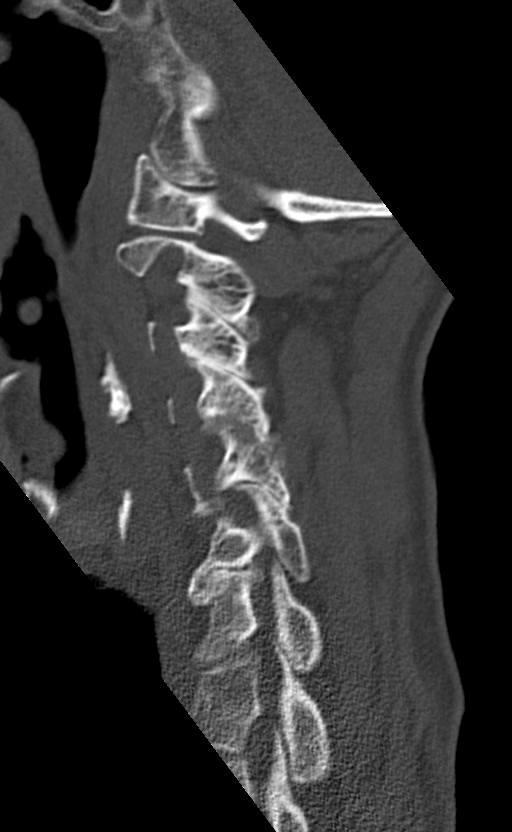
[im 33/65  bone]
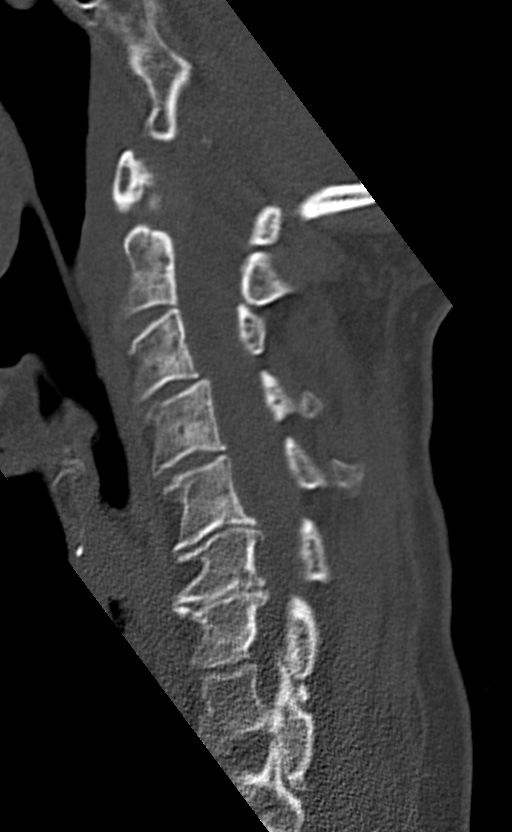
[im 43/65  bone]
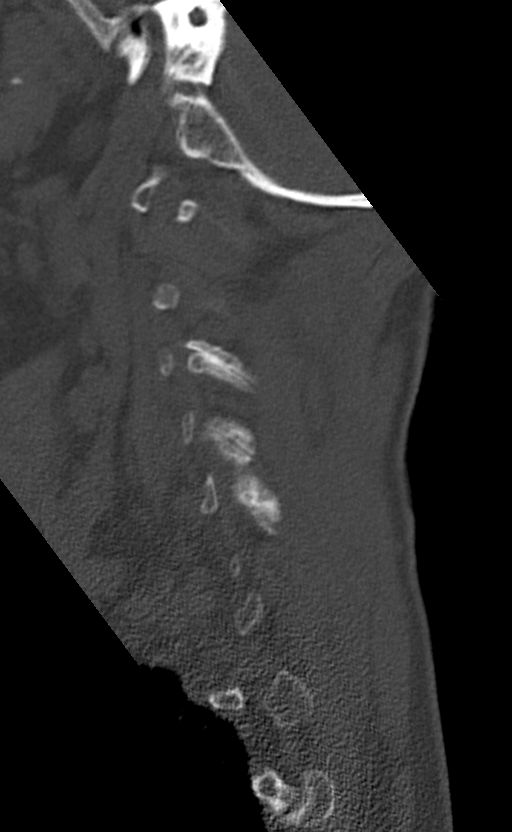
[im 54/65  bone]
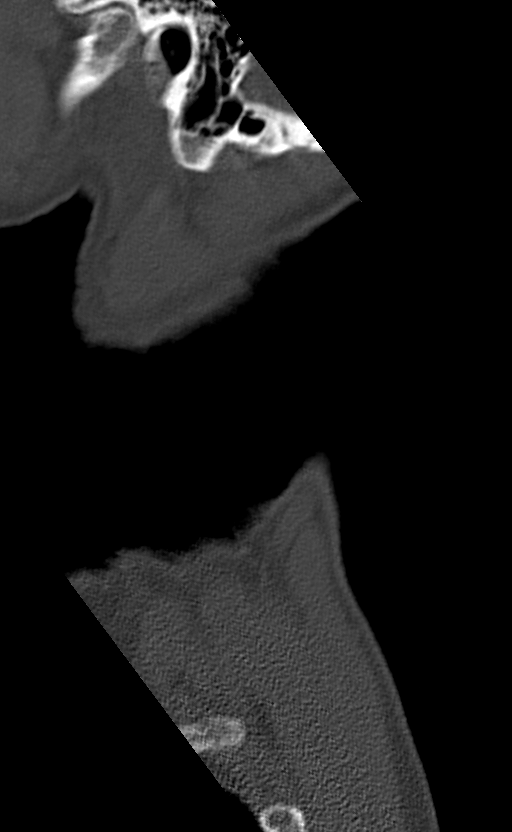

[Series 12: coronal bone · axial · 0.25mm/px · z∈[+587,+635]mm · 3 of 57 slices shown, 4 images]
[im 15/57  soft-tissue]
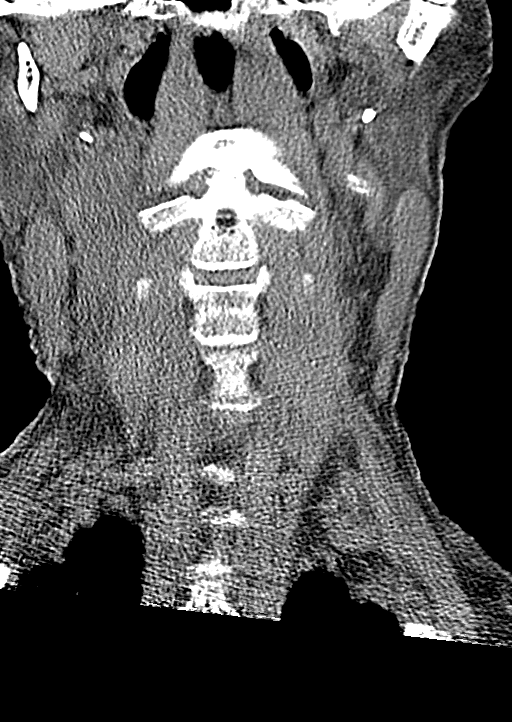
[im 15/57  bone]
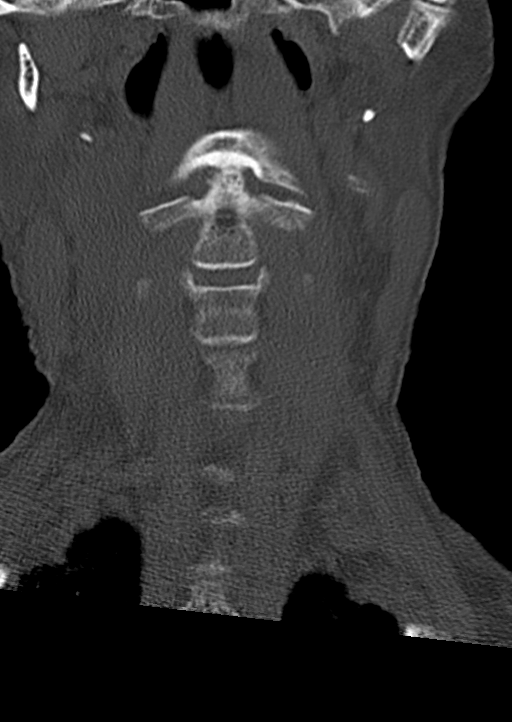
[im 29/57  bone]
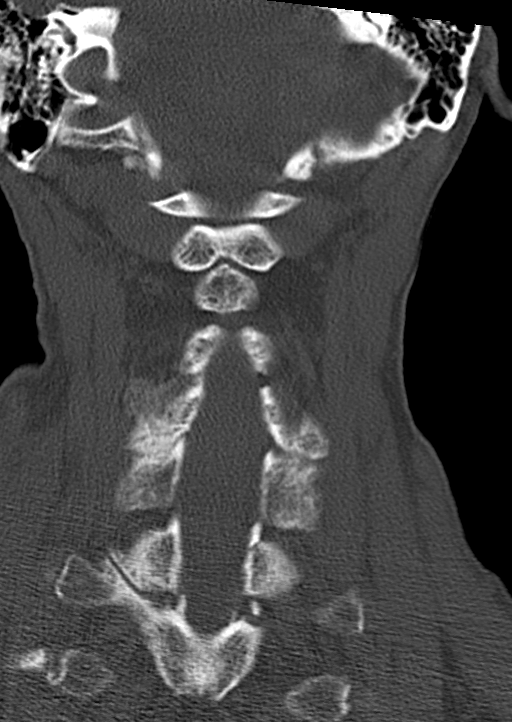
[im 43/57  bone]
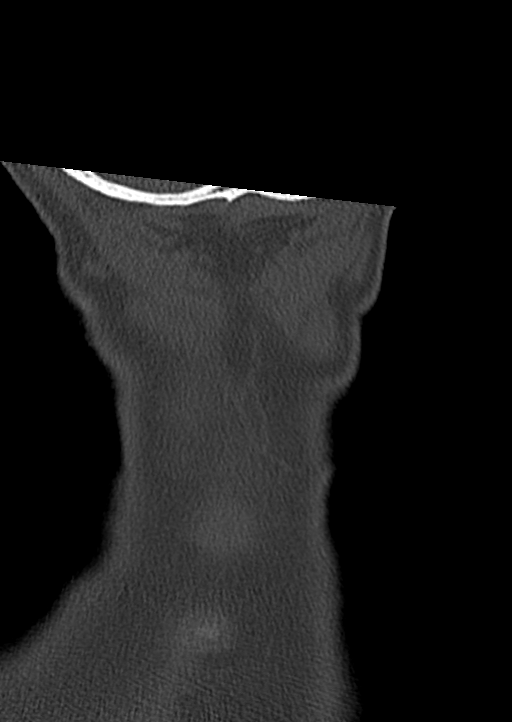

[Series 13: orthogonal bone · coronal · 0.22mm/px · 2 of 92 slices shown]
[im 31/92  bone]
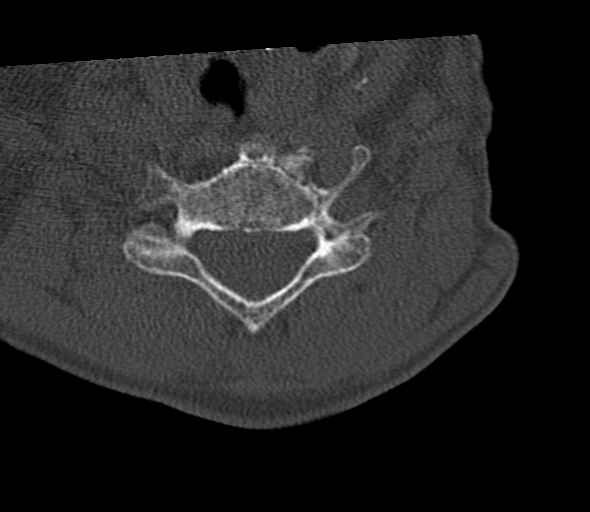
[im 61/92  bone]
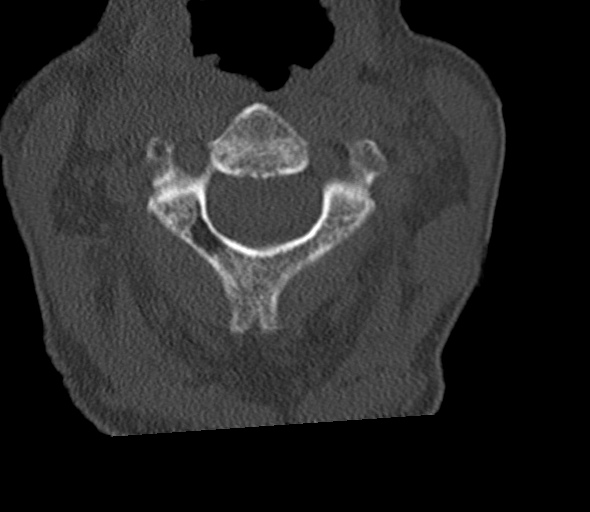

[12 of 33 positions shown; findings below may reference images not displayed]

FINDINGS: CT HEAD FINDINGS

Brain: No evidence of acute infarction, hemorrhage, hydrocephalus,
extra-axial collection or mass lesion / mass effect.

Prominence of the ventricles and sulci reflects mild to moderate
cortical volume loss. Scattered periventricular and subcortical
white matter change may reflect small vessel ischemic
microangiopathy.

The brainstem and fourth ventricle are within normal limits. The
basal ganglia are unremarkable in appearance. The cerebral
hemispheres demonstrate grossly normal gray-white differentiation.
No mass effect or midline shift is seen.

Vascular: No hyperdense vessel or unexpected calcification.

Skull: There is no evidence of fracture; visualized osseous
structures are unremarkable in appearance.

Sinuses/Orbits: The visualized portions of the orbits are within
normal limits. The paranasal sinuses and mastoid air cells are
well-aerated.

Other: No significant soft tissue abnormalities are seen. The known
soft tissue laceration is not well characterized on CT.

CT CERVICAL SPINE FINDINGS

Alignment: There is mild grade 1 anterolisthesis of C3 on C4 and of
C4 on C5, reflecting underlying facet disease.

Skull base and vertebrae: No acute fracture. No primary bone lesion
or focal pathologic process.

Soft tissues and spinal canal: No prevertebral fluid or swelling. No
visible canal hematoma.

Disc levels: Multilevel disc space narrowing is noted along the
lower cervical spine, with scattered anterior and posterior disc
osteophyte complexes, and underlying facet disease.

Upper chest: Calcification is noted at the carotid bifurcations
bilaterally. Blebs are noted at the right lung apex. The visualized
portions of the thyroid gland are grossly unremarkable.

Other: No additional soft tissue abnormalities are seen.
IMPRESSION: 1. No evidence of traumatic intracranial injury or fracture.
2. No evidence of fracture or subluxation along the cervical spine.
3. Mild to moderate cortical volume loss and scattered small vessel
ischemic microangiopathy.
4. Mild degenerative change along the cervical spine.
5. Calcification at the carotid bifurcations bilaterally. Carotid
ultrasound would be helpful for further evaluation, when and as
deemed clinically appropriate.

## 2020-10-24 IMAGING — DX DG CHEST 1V PORT
1 series · 1 of 1 positions shown · non-contrast
Comparison: 06/26/2018

CLINICAL DATA: Fevers

EXAM:
PORTABLE CHEST 1 VIEW

[chest ap]
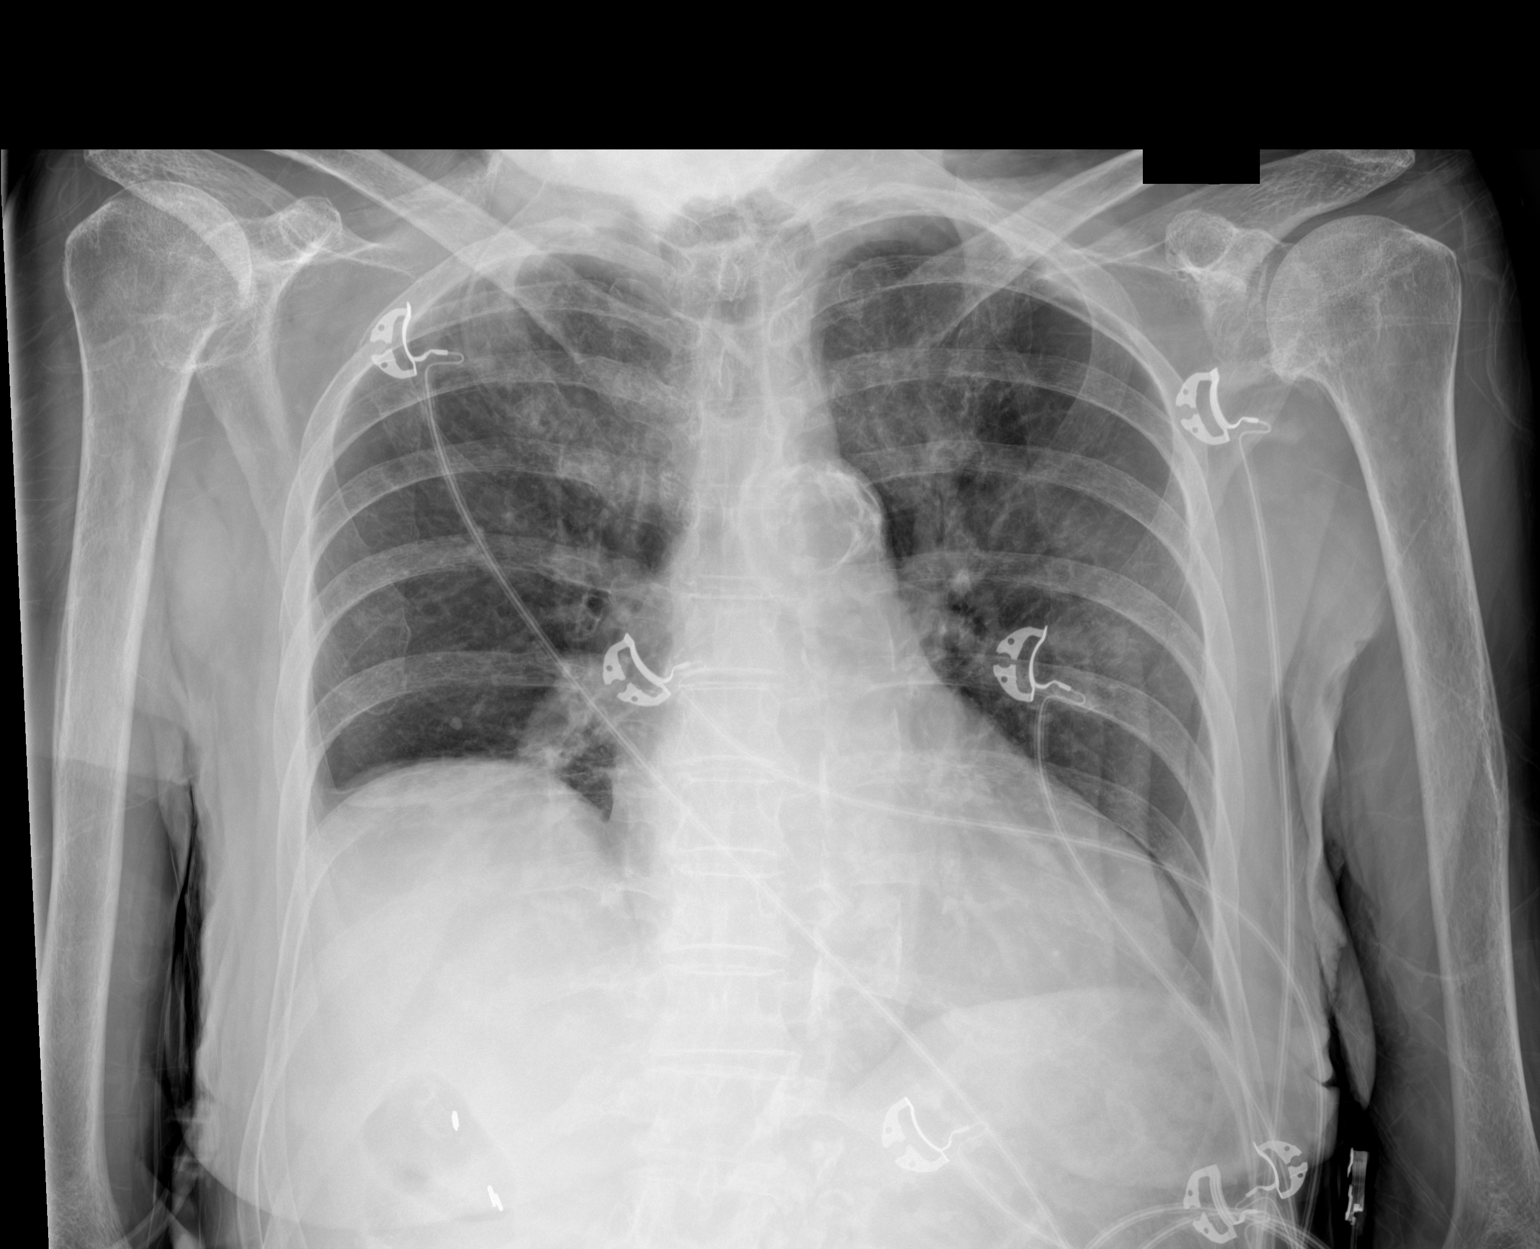

[1 of 1 positions shown; findings below may reference images not displayed]

FINDINGS: Cardiac shadow is at the upper limits of normal in size. Aortic
calcifications are again noted and stable. Elevation of the right
hemidiaphragm is noted. Multiple skin folds are noted over the left
lung. No focal infiltrate or effusion is seen.
IMPRESSION: No active disease.

## 2021-02-25 IMAGING — DX DG CHEST 1V PORT
1 series · 1 of 1 positions shown · non-contrast
Comparison: 02/20/2019.

CLINICAL DATA: Fever.

EXAM:
PORTABLE CHEST 1 VIEW

[chest ap]
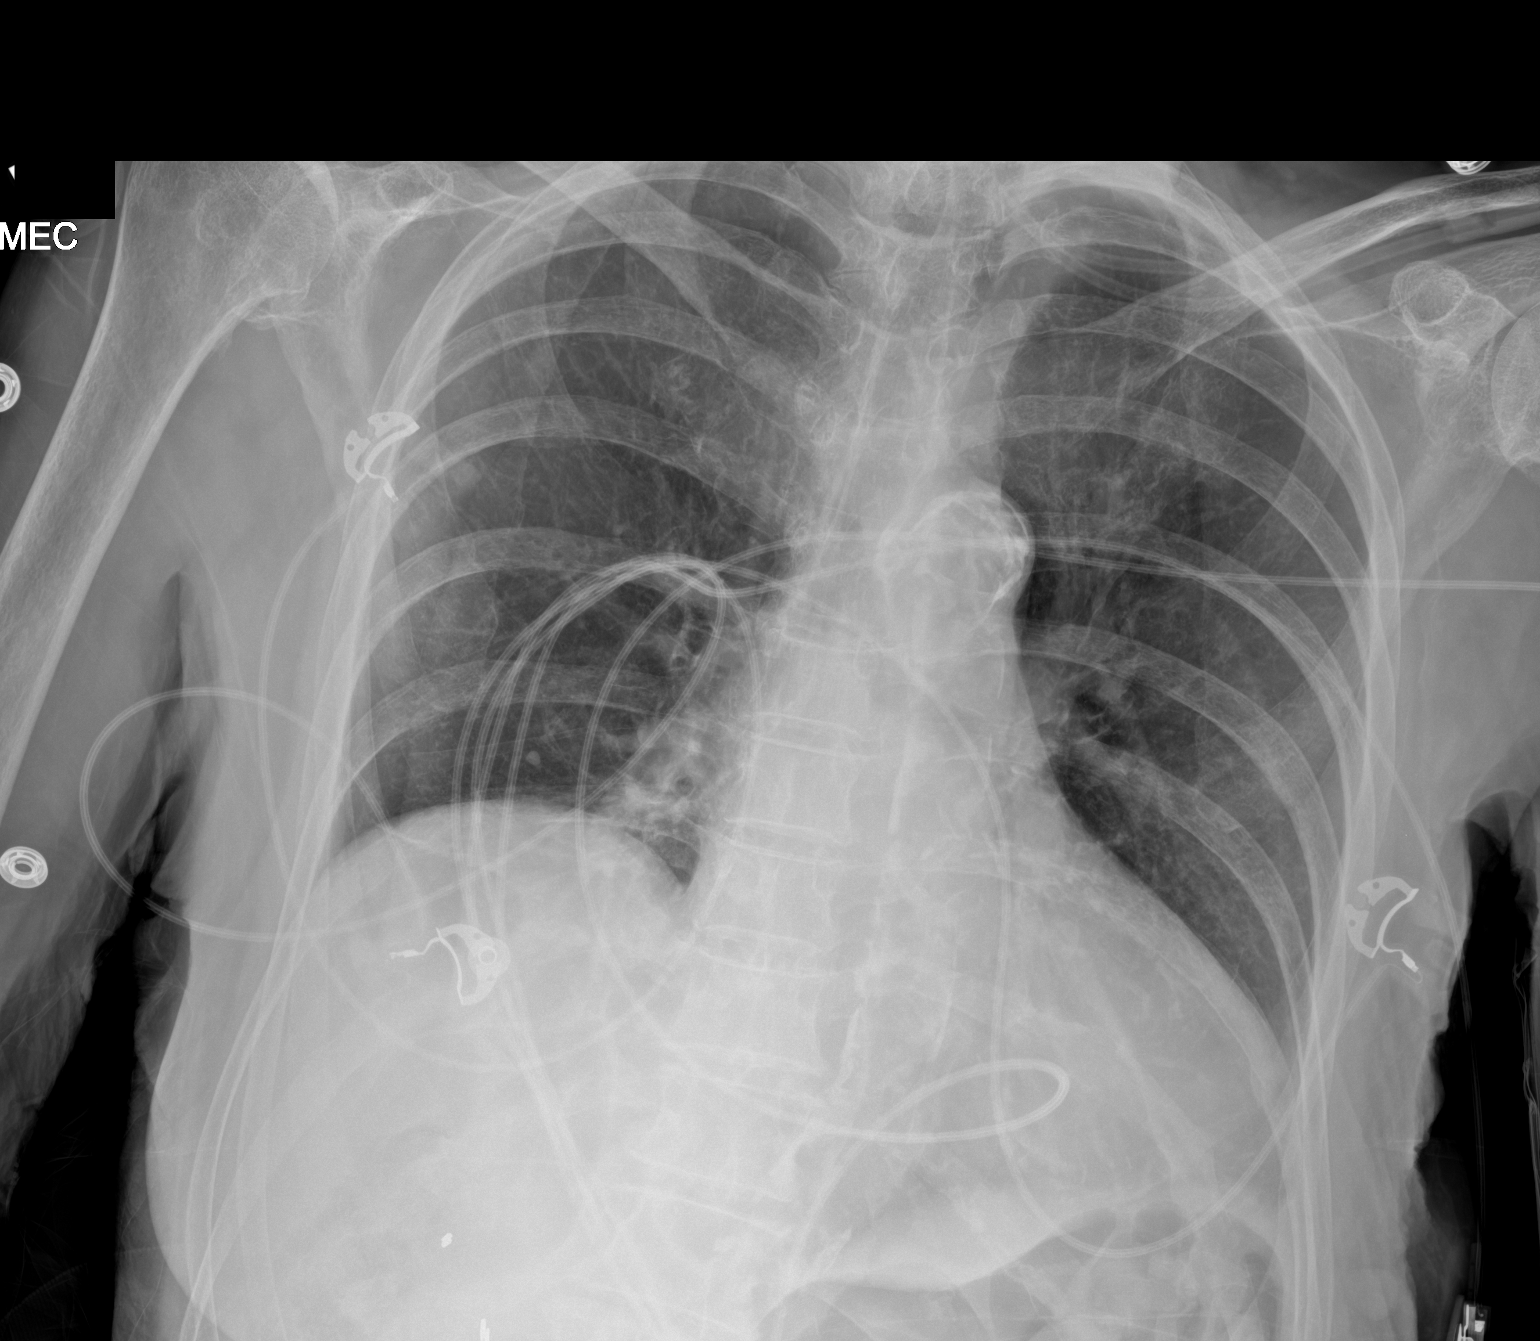

[1 of 1 positions shown; findings below may reference images not displayed]

FINDINGS: Mediastinum and hilar structures normal. Mild bibasilar atelectasis.
No focal alveolar infiltrate. No pleural effusion or pneumothorax.
Stable elevation right hemidiaphragm. Degenerative change thoracic
spine. Degenerative changes both shoulders.
IMPRESSION: Mild bibasilar atelectasis. Stable elevation right hemidiaphragm.
Chest is stable from prior exam.
